# Patient Record
Sex: Female | Born: 1950 | Race: Black or African American | Hispanic: No | Smoking: Never smoker
Health system: Southern US, Community
[De-identification: ages and names within clinical notes are randomized; demographics above are authoritative.]

## PROBLEM LIST (undated history)

## (undated) DIAGNOSIS — I10 Essential (primary) hypertension: Secondary | ICD-10-CM

## (undated) DIAGNOSIS — M1990 Unspecified osteoarthritis, unspecified site: Secondary | ICD-10-CM

## (undated) DIAGNOSIS — I2510 Atherosclerotic heart disease of native coronary artery without angina pectoris: Secondary | ICD-10-CM

## (undated) DIAGNOSIS — I509 Heart failure, unspecified: Secondary | ICD-10-CM

## (undated) DIAGNOSIS — F0390 Unspecified dementia without behavioral disturbance: Secondary | ICD-10-CM

## (undated) DIAGNOSIS — F32A Depression, unspecified: Secondary | ICD-10-CM

## (undated) DIAGNOSIS — E079 Disorder of thyroid, unspecified: Secondary | ICD-10-CM

## (undated) DIAGNOSIS — I639 Cerebral infarction, unspecified: Secondary | ICD-10-CM

## (undated) DIAGNOSIS — F329 Major depressive disorder, single episode, unspecified: Secondary | ICD-10-CM

## (undated) DIAGNOSIS — H539 Unspecified visual disturbance: Secondary | ICD-10-CM

## (undated) DIAGNOSIS — N186 End stage renal disease: Secondary | ICD-10-CM

## (undated) DIAGNOSIS — Z992 Dependence on renal dialysis: Secondary | ICD-10-CM

## (undated) DIAGNOSIS — E785 Hyperlipidemia, unspecified: Secondary | ICD-10-CM

## (undated) DIAGNOSIS — R0602 Shortness of breath: Secondary | ICD-10-CM

## (undated) DIAGNOSIS — K5900 Constipation, unspecified: Secondary | ICD-10-CM

## (undated) HISTORY — PX: TUBAL LIGATION: SHX77

## (undated) HISTORY — DX: Hyperlipidemia, unspecified: E78.5

## (undated) HISTORY — DX: Disorder of thyroid, unspecified: E07.9

## (undated) HISTORY — PX: ARTERIOVENOUS GRAFT PLACEMENT: SUR1029

## (undated) HISTORY — DX: Heart failure, unspecified: I50.9

## (undated) HISTORY — DX: Essential (primary) hypertension: I10

---

## 2009-04-21 ENCOUNTER — Ambulatory Visit: Payer: Self-pay | Admitting: Vascular Surgery

## 2009-07-06 ENCOUNTER — Ambulatory Visit: Payer: Self-pay | Admitting: Surgery

## 2009-07-28 ENCOUNTER — Ambulatory Visit (HOSPITAL_COMMUNITY): Admission: RE | Admit: 2009-07-28 | Discharge: 2009-07-28 | Payer: Self-pay | Admitting: Surgery

## 2009-07-28 ENCOUNTER — Ambulatory Visit: Payer: Self-pay | Admitting: Surgery

## 2009-08-14 ENCOUNTER — Ambulatory Visit: Payer: Self-pay | Admitting: Vascular Surgery

## 2009-08-31 ENCOUNTER — Ambulatory Visit: Payer: Self-pay | Admitting: Surgery

## 2010-06-15 LAB — POCT I-STAT 4, (NA,K, GLUC, HGB,HCT)
HCT: 37 % (ref 36.0–46.0)
Hemoglobin: 12.6 g/dL (ref 12.0–15.0)
Potassium: 4.1 mEq/L (ref 3.5–5.1)
Sodium: 142 mEq/L (ref 135–145)

## 2010-06-15 LAB — GLUCOSE, CAPILLARY: Glucose-Capillary: 91 mg/dL (ref 70–99)

## 2010-07-22 ENCOUNTER — Ambulatory Visit (INDEPENDENT_AMBULATORY_CARE_PROVIDER_SITE_OTHER): Payer: Medicaid Other

## 2010-07-22 DIAGNOSIS — T82898A Other specified complication of vascular prosthetic devices, implants and grafts, initial encounter: Secondary | ICD-10-CM

## 2010-07-22 DIAGNOSIS — N186 End stage renal disease: Secondary | ICD-10-CM

## 2010-07-23 NOTE — Assessment & Plan Note (Signed)
OFFICE VISIT  FINI, Javana L DOB:  05/18/1950                                       07/22/2010 CHART#:[id number]  CHIEF COMPLAINT:  Followup left basilic vein transposition.  HISTORY OF PRESENT ILLNESS:  The patient is a 60 year old woman with chronic kidney disease who is not yet on hemodialysis.  She had a left basilic vein transposition placed on 07/29/2009 by Dr. Myra Gianotti. Recently she was referred by Dr. Darrick Penna because there was no thrill or bruit in the transposition and she was sent for further evaluation. The patient denies any signs of steal.  She has no numbness, tingling or pain in the hand.  She does complain of left shoulder stiffness.  She cannot raise her arm above her head or put it behind her back and this has been going on for several months.  There are no changes in the patient's medical history.  She has a history of diabetes, high blood pressure and hypercholesterolemia as well as some depression.  MEDICATIONS:  Were reviewed today: 1. She takes Paxil 20 mg daily. 2. Quinapril 40 mg twice daily. 3. Vytorin she is no longer taking. 4. Neurontin 600 mg t.i.d. 5. Lantus 20 units at bedtime. 6. Lasix is increased to 80 mg daily. 7. Coreg 25 mg twice daily. 8. Calcitriol at 0.25 mcg once daily alternating with 2 daily. 9. Zocor 40 mg at bedtime.  REVIEW OF SYSTEMS:  Is unchanged.  She has chronic kidney disease and is still urinating.  She has diabetes, high blood pressure.  PHYSICAL EXAM:  This is a well-developed, well-nourished woman in no acute distress.  There is no thrill or bruit in the left upper extremity basilic vein transposition.  All her wounds are healing well.  There is questionable Doppler flow.  However, this is probably the brachial artery.  Her left upper extremity is warm and pink.  Bilateral upper extremities had 2+ radial pulses and 5/5 grip.  Bilaterally her hands were warm and pink.  Her heart rate was  78, her saturations were 98%, respiratory rate was 12.  ASSESSMENT AND PLAN:  Nonfunctioning basilic vein transposition in the left upper extremity with vein mapping showing a small basilic vein on the right and inadequate cephalic veins bilaterally.  In discussion with Dr. Darrick Penna we will hold off putting in a new graft until the patient is closer to needing hemodialysis.  It was felt that the patient will need a Gore-Tex graft as her next hemodialysis access as her right basilic vein and bilateral cephalic veins were insufficient for AV fistulas per vein mapping.  Della Goo, PA-C  Charles E. Fields, MD Electronically Signed  RR/MEDQ  D:  07/22/2010  T:  07/23/2010  Job:  [id number]

## 2010-08-10 NOTE — Consult Note (Signed)
NEW PATIENT CONSULTATION   [name], [name]  DOB:  [birth year]                                       04/21/2009  WGNFA#:[id number]   The patient is referred by Dr. Darrick Penna  for vascular access.  She is a  60 year old female with end-stage renal disease not yet on hemodialysis.  She is reluctant to proceed with access at this time but is willing to  be evaluated.  She is right-handed.   CHRONIC MEDICAL PROBLEMS:  1. Diabetes mellitus type 1.  2. Hypertension.  3. Hyperlipidemia.  4. Hyperparathyroidism.  5. Chronic renal insufficiency.  6. Negative for coronary artery disease, COPD or stroke.   PAST SURGICAL HISTORY:  Tubal surgery for tubal pregnancy x2.   FAMILY HISTORY:  Positive for diabetes and stroke in her father.  Negative for coronary artery disease.   SOCIAL HISTORY:  She is single, has one child and not employed.  She has  not used tobacco or alcohol.   REVIEW OF SYSTEMS:  Negative for chest pain, dyspnea on exertion, PND,  orthopnea.  No pulmonary, GI or claudication-type symptoms.  Denies any  other neurologic or musculoskeletal systems.  All other systems are  negative on review of systems.   PHYSICAL EXAMINATION:  Blood pressure 160/78, heart rate 91, respiration  14, temperature 98.4.  General:  She is a well-developed, well-nourished  female in no apparent distress, alert and oriented x3.  Neck: Supple, 3+  carotid pulses.  No bruits are audible.  Neurologic:  Normal.  Chest:  Clear to auscultation.  Cardiovascular:  Regular rhythm.  No murmurs.  Abdomen:  Soft, nontender with no masses.  Musculoskeletal:  Exam  reveals no major deformities.  Neurologic:  Normal.  Skin:  Free of  rashes.  Left upper extremity reveals 3+ brachial, 2 to 3+ radial pulse.   Today I have ordered and interpreted vein mapping of her left upper  extremity which revealed a patent basilic system on the left side,  slightly better than the right.  Cephalic systems  on both sides are  inadequate for fistula in the forearm or upper arm.  I also visualized  this myself with the SonoSite.  I think her basilic vein is marginal but  we could attempt a basilic vein transposition.  If it is found to be too  small at the time of surgery, we will proceed with left arm AV graft.  She is reluctant to schedule it at that this time but we will aim for  Thursday, February 10.  She will call us to confirm that she will plan  for surgery on that date.     Quita Skye Hart [place], M.D.  Electronically Signed   JDL/MEDQ  D:  04/21/2009  T:  04/22/2009  Job:  [id number]   cc:   Fayrene Fearing L. Deterding, M.D.

## 2010-08-10 NOTE — Assessment & Plan Note (Signed)
OFFICE VISIT   Kelly, [name]  DOB:  19-Aug-1950                                       08/14/2009  CHART#:[id number]   Date of surgery is 07/28/2009.  She is status post a left basilic vein  transposition.   The patient is a 60 year old woman with history of hypertension and  diabetes who is not yet on hemodialysis but has end-stage renal disease  and had a left upper arm basilic vein transposition by Dr. Myra [name] on  07/28/2009.  She has been doing well.  She has had no signs of steal.  She complains of some achiness at night but during the  Day the hand is nice and warm and pink and the achiness is around the  incisional areas.  She has no numbness or tingling in the fingers or  hand and she states she has good motion and is able to use her hand  normally.   PHYSICAL EXAM:  General:  This is a well-developed, well-nourished woman  in no acute distress.  Vital signs:  Her heart rate is 89, blood  pressure is 152/66, her temperature is 97.9.  Left upper extremity:  There is minimal swelling in the left upper extremity.  She has a good  thrill and bruit and the vein is very easily palpable.  She had a  positive radial pulse.  She has good motion and sensation in the hand.   ASSESSMENT:  Two weeks status post left basilic vein transposition with  wounds healing well and no signs of steal.   PLAN:  The patient is to follow up with Dr. Myra [name] in June.  If she has  any issues before that we will see her earlier.   Della Goo, PA-C   Larina Earthly, M.D.  Electronically Signed   RR/MEDQ  D:  08/14/2009  T:  08/14/2009  Job:  [id number]

## 2010-08-10 NOTE — Assessment & Plan Note (Signed)
OFFICE VISIT   [name], [name]  DOB:  12-08-1950                                       07/06/2009  CHART#:[id number]   REASON FOR VISIT:  Dialysis access.   HISTORY:  This is a 60 year old female with stage IV chronic kidney  disease.  She is not yet on dialysis.  She is right-handed.  Her renal  disease is due to diabetes and hypertension.  The patient was initially  scheduled to have an operation for dialysis on February 10.  However,  she cancelled.  She is reluctant about proceeding with the operation.  She comes back in today for further discussion.   The patient suffers from hyperparathyroidism.  It is a complication of  her renal insufficiency.  She also has type 1 diabetes, hypertension,  hyperlipidemia, which are medically managed.   REVIEW OF SYSTEMS:  GENERAL:  Negative fevers, chills, weight gain,  weight loss.  CARDIAC:  Negative.  PULMONARY:  Negative.  GI:  Negative.  GU:  Negative except for kidney disease, as above.  NEURO:  Negative.  MUSCULOSKELETAL:  Positive for joint pain.  PSYCH:  Negative.  EENT:  Negative.  HEME:  Negative.  SKIN:  Negative.   PAST MEDICAL HISTORY:  Diabetes, type 1, hypertension, hyperlipidemia,  hyperparathyroidism, chronic renal insufficiency.   PAST SURGICAL HISTORY:  Tubal ligation for tubal pregnancy x2.   FAMILY HISTORY:  Positive for diabetes and stroke in her father.  Negative for coronary disease.   SOCIAL HISTORY:  She is single.  One child.  Negative for tobacco or  alcohol.   ALLERGIES:  None.   MEDICATIONS:  Please see medical record.   PHYSICAL EXAMINATION:  Heart rate 73, blood pressure 134/67, temperature  is 98.2.  General:  She is well-appearing in no distress.  HEENT:  Within normal limits.  Lungs:  Respirations are nonlabored.  Cardiovascular:  Regular rate and rhythm.  Abdomen is soft and  nontender.  She has a well-healed infraumbilical midline incision.  Musculoskeletal is  without major deformity.  Neuro:  She has no focal  deficits.  Skin is without rash.   Vein mapping:  The patient has a 0.27 cm left basilic vein.   ASSESSMENT:  Chronic kidney disease needing permanent access.   PLAN:  I had a lengthy conversation describing the need to proceed with  access management at this time.  We talked about the procedure details  for basilic vein transposition on the left.  All of her questions were  answered.  Her surgery has been scheduled for Tuesday, May 3.     Jorge Ny, MD  Electronically Signed   VWB/MEDQ  D:  07/06/2009  T:  07/07/2009  Job:  [id number]   cc:   Fayrene Fearing [name]. Deterding, M.D.

## 2010-08-10 NOTE — Assessment & Plan Note (Signed)
OFFICE VISIT   [name], Kelly L  DOB:  [birth year]                                       08/31/2009  CHART#:[id number]   REASON FOR VISIT:  Followup.   The patient returns today for followup of her left basilic vein  transposition which was performed on 07/28/2009.  She has no complaints  except for some numbness in her forearm.  She denies any complaints of  steal syndrome.  She is not yet on dialysis and is due to see the renal  service in the next month.   On examination, there is a good thrill within her fistula.  Her hand is  warm.   At this point, I am satisfied with her results.  I would stress not to  have this accessed until August if she were to require dialysis.  I will  see her back on a p.r.n. basis.     Jorge Ny, MD  Electronically Signed   VWB/MEDQ  D:  08/31/2009  T:  09/01/2009  Job:  [id number]

## 2010-08-10 NOTE — Procedures (Signed)
CEPHALIC VEIN MAPPING   INDICATION:  Placement of AV fistula.   HISTORY:  Diabetes.   EXAM:   The right cephalic vein is compressible.   Diameter measurements range from 0.15 cm to 0.31 cm.   The right basilic vein is compressible.   Diameter measurements range from 0.21 cm to 0.32 cm.   The left cephalic vein is compressible.   Diameter measurements range from 0.09 cm to 0.18 cm.   The left basilic vein is compressible.   Diameter measurements range from 0.27 cm to 0.28 cm.   See attached worksheet for all measurements.   IMPRESSION:  1. Patient's bilateral cephalic veins are not of acceptable diameter      for use as a dialysis access site.  2. Patient's bilateral basilic veins are of marginal acceptable      diameter for use as a dialysis access site.        ___________________________________________  Quita Skye. Hart [place], M.D.   CB/MEDQ  D:  04/21/2009  T:  04/22/2009  Job:  [id number]

## 2010-10-25 ENCOUNTER — Encounter: Payer: Medicaid Other | Admitting: Surgery

## 2010-11-09 ENCOUNTER — Encounter: Payer: Self-pay | Admitting: Vascular Surgery

## 2010-11-25 ENCOUNTER — Ambulatory Visit: Payer: Medicaid Other | Admitting: Vascular Surgery

## 2010-11-26 ENCOUNTER — Encounter: Payer: Self-pay | Admitting: Vascular Surgery

## 2010-11-30 ENCOUNTER — Encounter: Payer: Medicaid Other | Admitting: Vascular Surgery

## 2010-12-28 ENCOUNTER — Other Ambulatory Visit: Payer: Medicaid Other

## 2011-01-17 ENCOUNTER — Encounter: Payer: Self-pay | Admitting: Vascular Surgery

## 2011-01-18 ENCOUNTER — Ambulatory Visit (INDEPENDENT_AMBULATORY_CARE_PROVIDER_SITE_OTHER): Payer: Medicaid Other | Admitting: Vascular Surgery

## 2011-01-18 ENCOUNTER — Encounter: Payer: Self-pay | Admitting: Vascular Surgery

## 2011-01-18 VITALS — BP 128/68 | HR 67 | Resp 16 | Ht 62.0 in | Wt 146.3 lb

## 2011-01-18 DIAGNOSIS — I70219 Atherosclerosis of native arteries of extremities with intermittent claudication, unspecified extremity: Secondary | ICD-10-CM

## 2011-01-18 NOTE — Progress Notes (Signed)
The patient presents today for evaluation of left leg discomfort. She reports difficulty with her left leg. She reports that this causes her to "hop" when she walks. He denies any pain area she specifically denies any calf cramping or discomfort with walking. She has no history of tissue loss. He does have known renal insufficiency. He reports left arm bursitis has had injection with some mild improvement. She has been worked up for possible right brain stroke with CT showing no change and a carotid duplex showing no significant stenosis. I had these for review and discussed these as well. There  Past Medical History  Diagnosis Date  . Diabetes mellitus   . Hypertension   . CHF (congestive heart failure)   . Hyperlipidemia   . Chronic kidney disease   . Thyroid disease     History  Substance Use Topics  . Smoking status: Never Smoker   . Smokeless tobacco: Not on file  . Alcohol Use: No    Family History  Problem Relation Age of Onset  . Diabetes Father   . Stroke Father     No Known Allergies  Current outpatient prescriptions:amLODipine (NORVASC) 5 MG tablet, Take 5 mg by mouth daily.  , Disp: , Rfl: ;  aspirin 81 MG tablet, Take 81 mg by mouth daily.  , Disp: , Rfl: ;  calcitRIOL (ROCALTROL) 0.25 MCG capsule, Take 0.25 mcg by mouth daily.  , Disp: , Rfl: ;  carvedilol (COREG) 25 MG tablet, Take 25 mg by mouth 2 (two) times daily with a meal.  , Disp: , Rfl: ;  furosemide (LASIX) 80 MG tablet, Take 80 mg by mouth daily.  , Disp: , Rfl:  gabapentin (NEURONTIN) 600 MG tablet, Take 600 mg by mouth 3 (three) times daily.  , Disp: , Rfl: ;  Insulin Glargine (LANTUS [place]), Inject 20 Units into the skin at bedtime.  , Disp: , Rfl: ;  quinapril (ACCUPRIL) 40 MG tablet, Take 40 mg by mouth 2 (two) times daily.  , Disp: , Rfl: ;  cilostazol (PLETAL) 100 MG tablet, Take 100 mg by mouth 2 (two) times daily.  , Disp: , Rfl:  ergocalciferol (VITAMIN D2) 50000 UNITS capsule, Take 50,000 Units by mouth  once a week.  , Disp: , Rfl: ;  ezetimibe-simvastatin (VYTORIN) 10-40 MG per tablet, Take 1 tablet by mouth at bedtime.  , Disp: , Rfl: ;  PARoxetine (PAXIL) 20 MG tablet, Take 20 mg by mouth every morning.  , Disp: , Rfl: ;  simvastatin (ZOCOR) 40 MG tablet, Take 40 mg by mouth at bedtime.  , Disp: , Rfl:   BP 128/68  Pulse 67  Resp 16  Ht 5\' 2"  (1.575 m)  Wt 146 lb 4.8 oz (66.361 kg)  BMI 26.76 kg/m2  Body mass index is 26.76 kg/(m^2).       Visit exam well-developed black female in no acute distress area HEENT normal. Carotid arteries without bruits bilaterally. She has 2+ radial and 2+ femoral pulses bilaterally. I do not feel pedal pulses. She has no tissue loss on her feet and no ulcers or rashes. On walking she does have an unusual gait was some stiffness in her hip and also has stiffness in her left arm. Heart regular rate and rhythm. Chest clear bilaterally.  Impression and plan: Patient does have moderate lower extremity arterial insufficiency by physical exam. Her symptoms did not go along with this. Her walking difficulties related to this this specifically in her left  hip. I would not recommend any further evaluation she develops true claudication symptoms or rest pain. She will see [place] on an as-needed basis.

## 2011-02-03 ENCOUNTER — Encounter: Payer: Self-pay | Admitting: Vascular Surgery

## 2011-02-03 DIAGNOSIS — I70219 Atherosclerosis of native arteries of extremities with intermittent claudication, unspecified extremity: Secondary | ICD-10-CM | POA: Insufficient documentation

## 2011-03-09 ENCOUNTER — Ambulatory Visit: Payer: Medicaid Other | Admitting: Vascular Surgery

## 2011-03-28 ENCOUNTER — Encounter: Payer: Self-pay | Admitting: Vascular Surgery

## 2011-03-30 ENCOUNTER — Encounter: Payer: Self-pay | Admitting: Vascular Surgery

## 2011-03-30 ENCOUNTER — Ambulatory Visit (INDEPENDENT_AMBULATORY_CARE_PROVIDER_SITE_OTHER): Payer: Medicaid Other | Admitting: Vascular Surgery

## 2011-03-30 VITALS — BP 116/64 | HR 75 | Temp 98.3°F | Ht 62.0 in | Wt 146.0 lb

## 2011-03-30 DIAGNOSIS — T82898A Other specified complication of vascular prosthetic devices, implants and grafts, initial encounter: Secondary | ICD-10-CM | POA: Insufficient documentation

## 2011-03-30 DIAGNOSIS — N186 End stage renal disease: Principal | ICD-10-CM

## 2011-03-30 DIAGNOSIS — Z992 Dependence on renal dialysis: Secondary | ICD-10-CM | POA: Insufficient documentation

## 2011-03-30 HISTORY — DX: End stage renal disease: N18.6

## 2011-03-30 NOTE — Progress Notes (Signed)
[facility]  Patient name: [name] MRN: [id number] DOB: 1950-04-14 Sex: female  CC: evaluate for new hemodialysis access.   HPI: [name] is a 61 y.o. female with chronic kidney disease who is not yet on dialysis. She previously had a left basilic vein transposition which has been chronically occluded. She states that ever since that surgery she's had problems with bursitis in her left shoulder and she's not fully able to rotate her forearm. He had been evaluated in our office before and was felt to be a candidate for an AV graft.  However, this had never been scheduled. She comes in today to discuss scheduling surgery. She's had no recent uremic symptoms. Specifically she denies nausea, vomiting, fatigue, anorexia, or palpitations.  Past Medical History  Diagnosis Date  . Diabetes mellitus   . Hypertension   . CHF (congestive heart failure)   . Hyperlipidemia   . Chronic kidney disease   . Thyroid disease     Family History  Problem Relation Age of Onset  . Diabetes Father   . Stroke Father     SOCIAL HISTORY: History  Substance Use Topics  . Smoking status: Never Smoker   . Smokeless tobacco: Not on file  . Alcohol Use: No    No Known Allergies  Current Outpatient Prescriptions  Medication Sig Dispense Refill  . amLODipine (NORVASC) 5 MG tablet Take 5 mg by mouth daily.        Marland Kitchen aspirin 81 MG tablet Take 81 mg by mouth daily.        . calcitRIOL (ROCALTROL) 0.25 MCG capsule Take 0.25 mcg by mouth daily.        . carvedilol (COREG) 25 MG tablet Take 25 mg by mouth 2 (two) times daily with a meal.        . cilostazol (PLETAL) 100 MG tablet Take 100 mg by mouth 2 (two) times daily.        . ergocalciferol (VITAMIN D2) 50000 UNITS capsule Take 50,000 Units by mouth once a week.        . ezetimibe-simvastatin (VYTORIN) 10-40 MG per tablet Take 1 tablet by mouth at bedtime.        . furosemide (LASIX) 80 MG tablet Take 80 mg by mouth  daily.        Marland Kitchen gabapentin (NEURONTIN) 600 MG tablet Take 600 mg by mouth 3 (three) times daily.        . Insulin Glargine (LANTUS [place]) Inject 20 Units into the skin at bedtime.        Marland Kitchen PARoxetine (PAXIL) 20 MG tablet Take 20 mg by mouth every morning.        . quinapril (ACCUPRIL) 40 MG tablet Take 40 mg by mouth 2 (two) times daily.        . simvastatin (ZOCOR) 40 MG tablet Take 40 mg by mouth at bedtime.          REVIEW OF SYSTEMS: Arly.Keller ] denotes positive finding; [  ] denotes negative finding CARDIOVASCULAR:  [ ]  chest pain   [ ]  chest pressure   [ ]  palpitations   [ ]  orthopnea   [ ]  dyspnea on exertion   [ ]  claudication   [ ]  rest pain   [ ]  DVT   [ ]  phlebitis PULMONARY:   [ ]  productive cough   [ ]  asthma   [ ]  wheezing CONSTITUTIONAL:  [ ]  fever   [ ]  chills  PHYSICAL EXAM: Filed Vitals:   03/30/11 1057  BP: 116/64  Pulse: 75  Temp: 98.3 F (36.8 C)  TempSrc: Oral  Height: 5\' 2"  (1.575 m)  Weight: 146 lb (66.225 kg)  SpO2: 98%   Body mass index is 26.70 kg/(m^2). GENERAL: The patient is a well-nourished female, in no acute distress. The vital signs are documented above. CARDIOVASCULAR: There is a regular rate and rhythm without significant murmur appreciated. She has a palpable brachial and radial pulse bilaterally. PULMONARY: There is good air exchange bilaterally without wheezing or rales. ABDOMEN: Soft and non-tender with normal pitched bowel sounds.  MUSCULOSKELETAL: she is unable to fully externally rotate her left forearm which she attributes to bursitis. NEUROLOGIC: No focal weakness or paresthesias are detected. SKIN: There are no ulcers or rashes noted. PSYCHIATRIC: The patient has a normal affect.  DATA:  I have reviewed her previous vein mapping which shows that her forearm and upper arm cephalic veins in both upper to terminate were not adequate for a fistula. Likewise her basilic vein on the right is not adequate.  MEDICAL ISSUES: I've recommended that we  place a left upper arm AV graft. She appears to have fairly small blood vessels and in addition I don't think she can adequately externally rotate her forearm on the left for using a forearm graft. Her surgeries been scheduled for 04/07/2011. I have explained the indications for placement of an AV graft. I have reviewed the potential complications of placement of an AV graft. These risks include, but are not limited to, graft thrombosis, graft infection, wound healing problems, bleeding, arm swelling, and steal syndrome. All the patient's questions were answered and they are agreeable to proceed with surgery.   Aaliyah Gavel S [facility] [place] Office: [phone]

## 2011-03-31 ENCOUNTER — Other Ambulatory Visit: Payer: Self-pay

## 2011-04-06 ENCOUNTER — Inpatient Hospital Stay (HOSPITAL_COMMUNITY): Admission: RE | Admit: 2011-04-06 | Payer: Medicaid Other | Source: Ambulatory Visit

## 2011-04-12 ENCOUNTER — Encounter (HOSPITAL_COMMUNITY): Payer: Self-pay | Admitting: Pharmacy Technician

## 2011-04-19 NOTE — Progress Notes (Signed)
Contacted Dr. Adele Dan, spoke with Drinda Butts, notified patient states will be postponing surgery due to having the "flu"

## 2011-04-22 ENCOUNTER — Encounter (HOSPITAL_COMMUNITY): Admission: RE | Payer: Self-pay | Source: Ambulatory Visit

## 2011-04-22 ENCOUNTER — Ambulatory Visit (HOSPITAL_COMMUNITY): Admission: RE | Admit: 2011-04-22 | Payer: Medicaid Other | Source: Ambulatory Visit | Admitting: Vascular Surgery

## 2011-04-22 SURGERY — INSERTION OF ARTERIOVENOUS (AV) GORE-TEX GRAFT ARM
Anesthesia: Monitor Anesthesia Care | Site: Arm Upper | Laterality: Left

## 2012-06-05 ENCOUNTER — Other Ambulatory Visit: Payer: Self-pay | Admitting: *Deleted

## 2012-06-05 DIAGNOSIS — Z01810 Encounter for preprocedural cardiovascular examination: Secondary | ICD-10-CM

## 2012-06-19 ENCOUNTER — Encounter: Payer: Self-pay | Admitting: Vascular Surgery

## 2012-06-20 ENCOUNTER — Ambulatory Visit: Payer: Medicaid Other | Admitting: Vascular Surgery

## 2012-06-25 ENCOUNTER — Encounter: Payer: Self-pay | Admitting: Vascular Surgery

## 2012-06-26 ENCOUNTER — Encounter: Payer: Self-pay | Admitting: Vascular Surgery

## 2012-06-26 ENCOUNTER — Ambulatory Visit (INDEPENDENT_AMBULATORY_CARE_PROVIDER_SITE_OTHER): Payer: Medicaid Other | Admitting: Vascular Surgery

## 2012-06-26 VITALS — BP 182/78 | HR 93 | Resp 18 | Ht 62.0 in | Wt 150.0 lb

## 2012-06-26 DIAGNOSIS — N184 Chronic kidney disease, stage 4 (severe): Secondary | ICD-10-CM

## 2012-06-26 NOTE — Progress Notes (Signed)
[facility] [place] HISTORY AND PHYSICAL   CC: ESRD Referring Physician: Dr Deterding  History of Present Illness: [name] is a 61 y.o. female with Hx HTN, DM and CKD Who is not yet on HD. She had a left BVT in 07/2009 which failed. She has small veins per VM done 07/2010. She was to have a Left upper arm AVGG in Jan. 2013 but this was cancelled. She is still making Urine. She is here for evaluation for new graft  No results found.  Past Medical History  Diagnosis Date  . Diabetes mellitus   . Hypertension   . CHF (congestive heart failure)   . Hyperlipidemia   . Chronic kidney disease   . Thyroid disease     ROS: [x] Positive   [ ] Denies    General: [ ] Weight loss, [ ] Fever, [ ] chills Neurologic: [ ] Dizziness, [ ] Blackouts, [ ] Seizure [ ] Stroke, [ ] "Mini stroke", [ ] Slurred speech, [ ] Temporary blindness; [ ] weakness in arms or legs, [ ] Hoarseness Cardiac: [ ] Chest pain/pressure, [ ] Shortness of breath at rest [ ] Shortness of breath with exertion, [ ] Atrial fibrillation or irregular heartbeat Vascular: [ ] Pain in legs with walking, [ ] Pain in legs at rest, [ ] Pain in legs at night,  [ ] Non-healing ulcer, [ ] Blood clot in vein/DVT,   Pulmonary: [ ] Home oxygen, [ ] Productive cough, [ ] Coughing up blood, [ ] Asthma,  [ ] Wheezing Musculoskeletal:  [x ] Arthritis, [ ] Low back pain, [ ] Joint pain Hematologic: [ ] Easy Bruising, [x ] Anemia; [ ] Hepatitis Gastrointestinal: [ ] Blood in stool, [ ] Gastroesophageal Reflux/heartburn, [ ] Trouble swallowing Urinary: [x ] chronic Kidney disease, [ ] on HD - [ ] MWF or [ ] TTHS, [ ] Burning with urination, [ ] Difficulty urinating Skin: [ ] Rashes, [ ] Wounds Psychological: [ ] Anxiety, [ ] Depression   Social History History  Substance Use Topics  . Smoking status: Never Smoker   . Smokeless tobacco: Never Used  . Alcohol Use: No    Family History Family History  Problem  Relation Age of Onset  . Diabetes Father   . Stroke Father   . Heart disease Mother     No Known Allergies  Current Outpatient Prescriptions  Medication Sig Dispense Refill  . amLODipine (NORVASC) 5 MG tablet Take 5 mg by mouth daily.        . aspirin 81 MG tablet Take 81 mg by mouth daily.        . calcitRIOL (ROCALTROL) 0.25 MCG capsule Take 0.25 mcg by mouth daily.        . carvedilol (COREG) 25 MG tablet Take 25 mg by mouth 2 (two) times daily with a meal.        . cilostazol (PLETAL) 100 MG tablet Take 100 mg by mouth 2 (two) times daily.        . ergocalciferol (VITAMIN D2) 50000 UNITS capsule Take 50,000 Units by mouth once a week.        . ezetimibe-simvastatin (VYTORIN) 10-40 MG per tablet Take 1 tablet by mouth at bedtime.        . furosemide (LASIX) 80 MG tablet Take 80 mg by mouth daily.        . gabapentin (NEURONTIN) 600 MG tablet Take 600 mg by   mouth 3 (three) times daily.        . Insulin Glargine (LANTUS [place]) Inject 20 Units into the skin at bedtime.        . PARoxetine (PAXIL) 20 MG tablet Take 20 mg by mouth every morning.        . quinapril (ACCUPRIL) 40 MG tablet Take 40 mg by mouth 2 (two) times daily.        . simvastatin (ZOCOR) 40 MG tablet Take 40 mg by mouth at bedtime.         No current facility-administered medications for this visit.    Physical Examination  Filed Vitals:   06/26/12 1546  BP: 182/78  Pulse: 93  Resp: 18    Body mass index is 27.43 kg/(m^2).  General:  WDWN in NAD Gait:slow and unsteady as pt cannot see well HENT: WNL  Eyes: Pupils equal Pulmonary: normal non-labored breathing , with bibasilar  Rales, but no rhonchi or wheezing Cardiac: RRR, without  Murmurs, rubs or gallops; No carotid bruits Skin: no rashes, ulcers noted Vascular Exam/Pulses: 2+ radial pulses bilat Left BVT -no thrill or bruit  Extremities without ischemic changes, no Gangrene , no cellulitis; no open wounds;  Musculoskeletal: no muscle wasting or  atrophy  Neurologic: A&O X 3; Appropriate Affect ; SENSATION: normal; MOTOR FUNCTION:  moving all extremities equally. Speech is fluent/normal  Non-Invasive Vascular Imaging: VM done in 5/ 2012 shows veins in both arms to be 2 mm or less   ASSESSMENT: [name] is a 61 y.o. 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 Deterding for timing of graft placement and call granddaughter- Shanika Alston [phone] with information regarding surgical date  Clinic MD: TFE   I have examined the patient, reviewed and agree with above. The next option for hemodialysis is a left upper arm AV graft. Discuss this and potential complications of this with the patient who understands. We will defer and checked with the nephrology group as to timing of her access.  EARLY, TODD, MD 06/26/2012 4:49 PM  

## 2012-06-28 ENCOUNTER — Encounter: Payer: Self-pay | Admitting: Nephrology

## 2012-06-28 ENCOUNTER — Other Ambulatory Visit: Payer: Self-pay

## 2012-06-29 ENCOUNTER — Encounter (HOSPITAL_COMMUNITY): Payer: Self-pay | Admitting: *Deleted

## 2012-06-29 ENCOUNTER — Other Ambulatory Visit: Payer: Self-pay

## 2012-06-29 NOTE — Progress Notes (Signed)
No cardiologist. Pt. Didn't know primary MD.stated she went to her 3 weeks ago (she is a new doctor) and the MD sent her to [facility] to be checked out. She stated the MD though she was having a heart attack. Stated the hospital ran a test, ekg? And said every thing was fine. Pt. Stated she had no symptoms. Requested ekg/studies/discharge summary form [facility].

## 2012-07-01 MED ORDER — DEXTROSE 5 % IV SOLN
1.5000 g | INTRAVENOUS | Status: AC
  Administered 2012-07-02: 1.5 g via INTRAVENOUS
  Filled 2012-07-01: qty 1.5

## 2012-07-02 ENCOUNTER — Encounter (HOSPITAL_COMMUNITY): Payer: Self-pay | Admitting: Anesthesiology

## 2012-07-02 ENCOUNTER — Ambulatory Visit (HOSPITAL_COMMUNITY): Payer: Medicaid Other

## 2012-07-02 ENCOUNTER — Encounter (HOSPITAL_COMMUNITY)
Admission: RE | Disposition: A | Discharge status: Home / Self Care | Payer: Self-pay | Source: Ambulatory Visit | Attending: Vascular Surgery

## 2012-07-02 ENCOUNTER — Ambulatory Visit (HOSPITAL_COMMUNITY): Payer: Medicaid Other | Admitting: Anesthesiology

## 2012-07-02 ENCOUNTER — Encounter (HOSPITAL_COMMUNITY): Payer: Self-pay | Admitting: *Deleted

## 2012-07-02 ENCOUNTER — Ambulatory Visit (HOSPITAL_COMMUNITY)
Admission: RE | Admit: 2012-07-02 | Discharge: 2012-07-02 | Disposition: A | Discharge status: Home / Self Care | Payer: Medicaid Other | Source: Ambulatory Visit | Attending: Vascular Surgery | Admitting: Vascular Surgery

## 2012-07-02 DIAGNOSIS — N186 End stage renal disease: Secondary | ICD-10-CM

## 2012-07-02 DIAGNOSIS — E785 Hyperlipidemia, unspecified: Secondary | ICD-10-CM | POA: Insufficient documentation

## 2012-07-02 DIAGNOSIS — E119 Type 2 diabetes mellitus without complications: Secondary | ICD-10-CM | POA: Insufficient documentation

## 2012-07-02 DIAGNOSIS — I509 Heart failure, unspecified: Secondary | ICD-10-CM | POA: Insufficient documentation

## 2012-07-02 DIAGNOSIS — E079 Disorder of thyroid, unspecified: Secondary | ICD-10-CM | POA: Insufficient documentation

## 2012-07-02 DIAGNOSIS — Z79899 Other long term (current) drug therapy: Secondary | ICD-10-CM | POA: Insufficient documentation

## 2012-07-02 DIAGNOSIS — I120 Hypertensive chronic kidney disease with stage 5 chronic kidney disease or end stage renal disease: Secondary | ICD-10-CM | POA: Insufficient documentation

## 2012-07-02 DIAGNOSIS — Z794 Long term (current) use of insulin: Secondary | ICD-10-CM | POA: Insufficient documentation

## 2012-07-02 HISTORY — DX: Major depressive disorder, single episode, unspecified: F32.9

## 2012-07-02 HISTORY — DX: Shortness of breath: R06.02

## 2012-07-02 HISTORY — PX: AV FISTULA PLACEMENT: SHX1204

## 2012-07-02 HISTORY — DX: Unspecified osteoarthritis, unspecified site: M19.90

## 2012-07-02 HISTORY — DX: Depression, unspecified: F32.A

## 2012-07-02 LAB — POCT I-STAT 4, (NA,K, GLUC, HGB,HCT): Glucose, Bld: 222 mg/dL — ABNORMAL HIGH (ref 70–99)

## 2012-07-02 LAB — GLUCOSE, CAPILLARY
Glucose-Capillary: 191 mg/dL — ABNORMAL HIGH (ref 70–99)
Glucose-Capillary: 206 mg/dL — ABNORMAL HIGH (ref 70–99)

## 2012-07-02 LAB — SURGICAL PCR SCREEN
MRSA, PCR: NEGATIVE
Staphylococcus aureus: NEGATIVE

## 2012-07-02 SURGERY — ARTERIOVENOUS (AV) FISTULA CREATION
Anesthesia: Monitor Anesthesia Care | Site: Arm Upper | Laterality: Left | Wound class: Clean

## 2012-07-02 MED ORDER — PROPOFOL INFUSION 10 MG/ML OPTIME
INTRAVENOUS | Status: DC | PRN
  Administered 2012-07-02: 75 ug/kg/min via INTRAVENOUS

## 2012-07-02 MED ORDER — MIDAZOLAM HCL 5 MG/5ML IJ SOLN
INTRAMUSCULAR | Status: DC | PRN
  Administered 2012-07-02 (×2): 1 mg via INTRAVENOUS

## 2012-07-02 MED ORDER — ONDANSETRON HCL 4 MG/2ML IJ SOLN: 4.0000 mg | Freq: Once | INTRAMUSCULAR | Status: DC | PRN

## 2012-07-02 MED ORDER — LIDOCAINE-EPINEPHRINE (PF) 1 %-1:200000 IJ SOLN
INTRAMUSCULAR | Status: DC | PRN
  Administered 2012-07-02: 4 mL

## 2012-07-02 MED ORDER — MUPIROCIN 2 % EX OINT: TOPICAL_OINTMENT | Freq: Two times a day (BID) | CUTANEOUS | Status: DC

## 2012-07-02 MED ORDER — MUPIROCIN 2 % EX OINT
TOPICAL_OINTMENT | CUTANEOUS | Status: AC
  Administered 2012-07-02: 10:00:00
  Filled 2012-07-02: qty 22

## 2012-07-02 MED ORDER — SODIUM CHLORIDE 0.9 % IV SOLN
INTRAVENOUS | Status: DC
  Administered 2012-07-02 (×2): via INTRAVENOUS

## 2012-07-02 MED ORDER — FENTANYL CITRATE 0.05 MG/ML IJ SOLN
INTRAMUSCULAR | Status: DC | PRN
  Administered 2012-07-02 (×3): 50 ug via INTRAVENOUS

## 2012-07-02 MED ORDER — BUPIVACAINE HCL (PF) 0.5 % IJ SOLN
INTRAMUSCULAR | Status: AC
  Filled 2012-07-02: qty 30

## 2012-07-02 MED ORDER — OXYCODONE HCL 5 MG PO TABS: 5.0000 mg | ORAL_TABLET | Freq: Four times a day (QID) | ORAL | Status: DC | PRN

## 2012-07-02 MED ORDER — HYDROMORPHONE HCL PF 1 MG/ML IJ SOLN: 0.2500 mg | INTRAMUSCULAR | Status: DC | PRN

## 2012-07-02 MED ORDER — 0.9 % SODIUM CHLORIDE (POUR BTL) OPTIME
TOPICAL | Status: DC | PRN
  Administered 2012-07-02: 1000 mL

## 2012-07-02 MED ORDER — SODIUM CHLORIDE 0.9 % IR SOLN
Status: DC | PRN
  Administered 2012-07-02: 12:00:00

## 2012-07-02 MED ORDER — LIDOCAINE-EPINEPHRINE (PF) 1 %-1:200000 IJ SOLN
INTRAMUSCULAR | Status: AC
  Filled 2012-07-02: qty 10

## 2012-07-02 MED ORDER — BUPIVACAINE HCL 0.5 % IJ SOLN
INTRAMUSCULAR | Status: DC | PRN
  Administered 2012-07-02: 4 mL

## 2012-07-02 MED ORDER — ACETAMINOPHEN 10 MG/ML IV SOLN: 1000.0000 mg | Freq: Once | INTRAVENOUS | Status: DC | PRN

## 2012-07-02 SURGICAL SUPPLY — 38 items
CANISTER SUCTION 2500CC (MISCELLANEOUS) ×2 IMPLANT
CATH EMB 4FR 80CM (CATHETERS) IMPLANT
CLIP TI MEDIUM 6 (CLIP) ×2 IMPLANT
CLIP TI WIDE RED SMALL 6 (CLIP) ×2 IMPLANT
CLOTH BEACON ORANGE TIMEOUT ST (SAFETY) ×2 IMPLANT
COVER PROBE W GEL 5X96 (DRAPES) ×2 IMPLANT
COVER SURGICAL LIGHT HANDLE (MISCELLANEOUS) ×2 IMPLANT
DERMABOND ADVANCED (GAUZE/BANDAGES/DRESSINGS) ×1
DERMABOND ADVANCED .7 DNX12 (GAUZE/BANDAGES/DRESSINGS) ×1 IMPLANT
ELECT REM PT RETURN 9FT ADLT (ELECTROSURGICAL) ×2
ELECTRODE REM PT RTRN 9FT ADLT (ELECTROSURGICAL) ×1 IMPLANT
GEL ULTRASOUND 20GR AQUASONIC (MISCELLANEOUS) ×2 IMPLANT
GLOVE BIO SURGEON STRL SZ7 (GLOVE) ×2 IMPLANT
GLOVE BIOGEL M 6.5 STRL (GLOVE) ×4 IMPLANT
GLOVE BIOGEL PI IND STRL 6.5 (GLOVE) ×4 IMPLANT
GLOVE BIOGEL PI IND STRL 7.0 (GLOVE) ×1 IMPLANT
GLOVE BIOGEL PI IND STRL 7.5 (GLOVE) ×1 IMPLANT
GLOVE BIOGEL PI INDICATOR 6.5 (GLOVE) ×4
GLOVE BIOGEL PI INDICATOR 7.0 (GLOVE) ×1
GLOVE BIOGEL PI INDICATOR 7.5 (GLOVE) ×1
GLOVE ECLIPSE 6.5 STRL STRAW (GLOVE) ×4 IMPLANT
GLOVE ECLIPSE 7.0 STRL STRAW (GLOVE) ×2 IMPLANT
GOWN STRL NON-REIN LRG LVL3 (GOWN DISPOSABLE) ×10 IMPLANT
KIT BASIN OR (CUSTOM PROCEDURE TRAY) ×2 IMPLANT
KIT ROOM TURNOVER OR (KITS) ×2 IMPLANT
NS IRRIG 1000ML POUR BTL (IV SOLUTION) ×2 IMPLANT
PACK CV ACCESS (CUSTOM PROCEDURE TRAY) ×2 IMPLANT
PAD ARMBOARD 7.5X6 YLW CONV (MISCELLANEOUS) ×4 IMPLANT
SPONGE SURGIFOAM ABS GEL 100 (HEMOSTASIS) IMPLANT
SUT MNCRL AB 4-0 PS2 18 (SUTURE) ×2 IMPLANT
SUT PROLENE 6 0 BV (SUTURE) ×4 IMPLANT
SUT PROLENE 7 0 BV 1 (SUTURE) ×4 IMPLANT
SUT VIC AB 3-0 SH 27 (SUTURE) ×1
SUT VIC AB 3-0 SH 27X BRD (SUTURE) ×1 IMPLANT
TOWEL OR 17X24 6PK STRL BLUE (TOWEL DISPOSABLE) ×2 IMPLANT
TOWEL OR 17X26 10 PK STRL BLUE (TOWEL DISPOSABLE) ×2 IMPLANT
UNDERPAD 30X30 INCONTINENT (UNDERPADS AND DIAPERS) ×2 IMPLANT
WATER STERILE IRR 1000ML POUR (IV SOLUTION) ×2 IMPLANT

## 2012-07-02 NOTE — Op Note (Signed)
OPERATIVE NOTE   PROCEDURE: 1. left first stage brachial vein transposition (brachiobrachial arteriovenous fistula) placement  PRE-OPERATIVE DIAGNOSIS: imminent end stage renal disease   POST-OPERATIVE DIAGNOSIS: same as above   SURGEON: Leonides Sake, MD  ASSISTANT(S): Doreatha Massed, PAC   ANESTHESIA: local and MAC  ESTIMATED BLOOD LOSS: 50 cc  FINDING(S): 1. Diseased but patent left brachial artery 2. Proximal left brachial vein only 3-4 mm in diameter 3. Palpable thrill at end of case 4. Dopplerable radial artery  SPECIMEN(S):  none  INDICATIONS:   [name] is a 62 y.o. female who presents with imminent end stage renal disease.  The patient is scheduled for a left upper arm arteriovenous graft.  The patient is aware the risks include but are not limited to: bleeding, infection, steal syndrome, nerve damage, ischemic monomelic neuropathy, failure to mature, and need for additional procedures.  The patient is aware of the risks of the procedure and elects to proceed forward.  DESCRIPTION: After full informed written consent was obtained from the patient, the patient was brought back to the operating room and placed supine upon the operating table.  Prior to induction, the patient received IV antibiotics.   After obtaining adequate anesthesia, the patient was then prepped and draped in the standard fashion for a left arm access procedure.  I turned my attention first to identifying the patient's brachial vein and brachial artery.  Using SonoSite guidance, the location of these vessels were marked out on the skin.   Unfortunately the brachial vein was only 3-4 mm on the Sonosite in the proximal upper arm.  The vein only became >6 mm in the axilla.  Distally the vein was about the same size, so I felt an attempt a brachial vein transposition was acceptable and might make a more distal upper arm arteriovenous graft possible if this fistula failed.  At this point, I injected local  anesthetic to obtain a field block of the antecubitum.  In total, I injected about 10 mL of a 1:1 mixture of 0.5% Marcaine without epinephrine and 1% lidocaine with epinephrine.  I made a longitudinal incision at the level of the antecubitum and dissected through the subcutaneous tissue and fascia to gain exposure of the brachial artery.  This was noted to be 3 mm in diameter externally.  This was dissected out proximally and distally and controlled with vessel loops .  I then dissected out the brachial vein.  This was noted to be 2.5-3 mm in diameter externally.  The distal segment of the vein was ligated with a  2-0 silk, and the vein was transected.  The proximal segment was iinterrogated with serial dilators.  The vein accepted up to a 3.5 mm dilator without any difficulty.  I then instilled the heparinized saline into the vein and clamped it.  At this point, I reset my exposure of the brachial artery and placed the artery under tension proximally and distally.  I made an arteriotomy with a #11 blade, and then I extended the arteriotomy with a Potts scissor.  I injected heparinized saline proximal and distal to this arteriotomy.  The vein was then sewn to the artery in an end-to-side configuration with a running stitch of 7-0 Prolene.  Prior to completing this anastomosis, I allowed the vein and artery to backbleed.  There was no evidence of clot from any vessels.  I completed the anastomosis in the usual fashion and then released all vessel loops and clamps.  There was a palpable  thrill in the venous outflow, and there was a dopplerable radial pulse.  At this point, I irrigated out the surgical wound.  There was no further active bleeding.  The subcutaneous tissue was reapproximated with a running stitch of 3-0 Vicryl.  The skin was then reapproximated with a running subcuticular stitch of 4-0 Vicryl.  The skin was then cleaned, dried, and reinforced with Dermabond.  The patient tolerated this procedure well.    COMPLICATIONS: none  CONDITION: stable  Leonides Sake, MD [facility] [place] Office: [phone] Pager: [phone]  07/02/2012, 12:53 PM

## 2012-07-02 NOTE — Preoperative (Signed)
Beta Blockers   Reason not to administer Beta Blockers:Not Applicable 

## 2012-07-02 NOTE — Transfer of Care (Signed)
Immediate Anesthesia Transfer of Care Note  Patient: [name]  Procedure(s) Performed: Procedure(s): ARTERIOVENOUS (AV) FISTULA CREATION (Left)  Patient Location: PACU  Anesthesia Type:MAC  Level of Consciousness: awake, alert , oriented and sedated  Airway & Oxygen Therapy: Patient Spontanous Breathing and Patient connected to nasal cannula oxygen  Post-op Assessment: Report given to PACU RN, Post -op Vital signs reviewed and stable and Patient moving all extremities  Post vital signs: Reviewed and stable  Complications: No apparent anesthesia complications

## 2012-07-02 NOTE — H&P (View-Only) (Signed)
[facility] [place] HISTORY AND PHYSICAL   CC: ESRD Referring Physician: Dr Deterding  History of Present Illness: [name] is a 62 y.o. female with Hx HTN, DM and CKD Who is not yet on HD. She had a left BVT in 07/2009 which failed. She has small veins per VM done 07/2010. She was to have a Left upper arm AVGG in Jan. 2013 but this was cancelled. She is still making Urine. She is here for evaluation for new graft  No results found.  Past Medical History  Diagnosis Date  . Diabetes mellitus   . Hypertension   . CHF (congestive heart failure)   . Hyperlipidemia   . Chronic kidney disease   . Thyroid disease     ROS: [x]  Positive   [ ]  Denies    General: [ ]  Weight loss, [ ]  Fever, [ ]  chills Neurologic: [ ]  Dizziness, [ ]  Blackouts, [ ]  Seizure [ ]  Stroke, [ ]  "Mini stroke", [ ]  Slurred speech, [ ]  Temporary blindness; [ ]  weakness in arms or legs, [ ]  Hoarseness Cardiac: [ ]  Chest pain/pressure, [ ]  Shortness of breath at rest [ ]  Shortness of breath with exertion, [ ]  Atrial fibrillation or irregular heartbeat Vascular: [ ]  Pain in legs with walking, [ ]  Pain in legs at rest, [ ]  Pain in legs at night,  [ ]  Non-healing ulcer, [ ]  Blood clot in vein/DVT,   Pulmonary: [ ]  Home oxygen, [ ]  Productive cough, [ ]  Coughing up blood, [ ]  Asthma,  [ ]  Wheezing Musculoskeletal:  [x ] Arthritis, [ ]  Low back pain, [ ]  Joint pain Hematologic: [ ]  Easy Bruising, [x ] Anemia; [ ]  Hepatitis Gastrointestinal: [ ]  Blood in stool, [ ]  Gastroesophageal Reflux/heartburn, [ ]  Trouble swallowing Urinary: [x ] chronic Kidney disease, [ ]  on HD - [ ]  MWF or [ ]  TTHS, [ ]  Burning with urination, [ ]  Difficulty urinating Skin: [ ]  Rashes, [ ]  Wounds Psychological: [ ]  Anxiety, [ ]  Depression   Social History History  Substance Use Topics  . Smoking status: Never Smoker   . Smokeless tobacco: Never Used  . Alcohol Use: No    Family History Family History  Problem  Relation Age of Onset  . Diabetes Father   . Stroke Father   . Heart disease Mother     No Known Allergies  Current Outpatient Prescriptions  Medication Sig Dispense Refill  . amLODipine (NORVASC) 5 MG tablet Take 5 mg by mouth daily.        Marland Kitchen aspirin 81 MG tablet Take 81 mg by mouth daily.        . calcitRIOL (ROCALTROL) 0.25 MCG capsule Take 0.25 mcg by mouth daily.        . carvedilol (COREG) 25 MG tablet Take 25 mg by mouth 2 (two) times daily with a meal.        . cilostazol (PLETAL) 100 MG tablet Take 100 mg by mouth 2 (two) times daily.        . ergocalciferol (VITAMIN D2) 50000 UNITS capsule Take 50,000 Units by mouth once a week.        . ezetimibe-simvastatin (VYTORIN) 10-40 MG per tablet Take 1 tablet by mouth at bedtime.        . furosemide (LASIX) 80 MG tablet Take 80 mg by mouth daily.        Marland Kitchen gabapentin (NEURONTIN) 600 MG tablet Take 600 mg by  mouth 3 (three) times daily.        . Insulin Glargine (LANTUS [place]) Inject 20 Units into the skin at bedtime.        Marland Kitchen PARoxetine (PAXIL) 20 MG tablet Take 20 mg by mouth every morning.        . quinapril (ACCUPRIL) 40 MG tablet Take 40 mg by mouth 2 (two) times daily.        . simvastatin (ZOCOR) 40 MG tablet Take 40 mg by mouth at bedtime.         No current facility-administered medications for this visit.    Physical Examination  Filed Vitals:   06/26/12 1546  BP: 182/78  Pulse: 93  Resp: 18    Body mass index is 27.43 kg/(m^2).  General:  WDWN in NAD Gait:slow and unsteady as pt cannot see well HENT: WNL  Eyes: Pupils equal Pulmonary: normal non-labored breathing , with bibasilar  Rales, but no rhonchi or wheezing Cardiac: RRR, without  Murmurs, rubs or gallops; No carotid bruits Skin: no rashes, ulcers noted Vascular Exam/Pulses: 2+ radial pulses bilat Left BVT -no thrill or bruit  Extremities without ischemic changes, no Gangrene , no cellulitis; no open wounds;  Musculoskeletal: no muscle wasting or  atrophy  Neurologic: A&O X 3; Appropriate Affect ; SENSATION: normal; MOTOR FUNCTION:  moving all extremities equally. Speech is fluent/normal  Non-Invasive Vascular Imaging: VM done in 5/ 2012 shows veins in both arms to be 2 mm or less   ASSESSMENT: [name] is a 62 y.o. 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 Darrick Penna for timing of graft placement and call granddaughter- Karrie Doffing [phone] with information regarding surgical date  Clinic MD: TFE   I have examined the patient, reviewed and agree with above. The next option for hemodialysis is a left upper arm AV graft. Discuss this and potential complications of this with the patient who understands. We will defer and checked with the nephrology group as to timing of her access.  Amyah Clawson, MD 06/26/2012 4:49 PM

## 2012-07-02 NOTE — Anesthesia Preprocedure Evaluation (Signed)
Anesthesia Evaluation  Patient identified by MRN, date of birth, ID band Patient awake    Reviewed: Allergy & Precautions, H&P , NPO status , Patient's Chart, lab work & pertinent test results, reviewed documented beta blocker date and time   Airway Mallampati: II      Dental  (+) Teeth Intact and Dental Advisory Given   Pulmonary  breath sounds clear to auscultation        Cardiovascular Rhythm:Regular Rate:Normal     Neuro/Psych    GI/Hepatic   Endo/Other    Renal/GU      Musculoskeletal   Abdominal   Peds  Hematology   Anesthesia Other Findings   Reproductive/Obstetrics                           Anesthesia Physical Anesthesia Plan  ASA: III  Anesthesia Plan: General   Post-op Pain Management:    Induction: Intravenous  Airway Management Planned: LMA  Additional Equipment:   Intra-op Plan:   Post-operative Plan: Extubation in OR  Informed Consent: I have reviewed the patients History and Physical, chart, labs and discussed the procedure including the risks, benefits and alternatives for the proposed anesthesia with the patient or authorized representative who has indicated his/her understanding and acceptance.   Dental advisory given  Plan Discussed with: CRNA, Anesthesiologist and Surgeon  Anesthesia Plan Comments: (ESRD has not started HD K -3.9 Type 2 DM glucose 222 Htn H/O CHF per records lungs clear, CXR OK  Plan GA with oral ETT  Kipp Brood, MD)        Anesthesia Quick Evaluation

## 2012-07-02 NOTE — Anesthesia Postprocedure Evaluation (Signed)
  Anesthesia Post-op Note  Patient: [name]  Procedure(s) Performed: Procedure(s): ARTERIOVENOUS (AV) FISTULA CREATION (Left)  Patient Location: PACU  Anesthesia Type:General  Level of Consciousness: awake, alert  and oriented  Airway and Oxygen Therapy: Patient Spontanous Breathing and Patient connected to nasal cannula oxygen  Post-op Pain: mild  Post-op Assessment: Post-op Vital signs reviewed, Patient's Cardiovascular Status Stable, Respiratory Function Stable, Patent Airway and Pain level controlled  Post-op Vital Signs: stable  Complications: No apparent anesthesia complications

## 2012-07-02 NOTE — Interval H&P Note (Signed)
[facility] of   History and Physical Update  The patient was interviewed and re-examined.  The patient's previous History and Physical has been reviewed and is unchanged from Dr. Bosie Helper consult.  There is no change in the plan of care: LUA AVG placement.  Leonides Sake, MD [facility] [place] Office: [phone] Pager: [phone]  07/02/2012, 10:53 AM

## 2012-07-03 ENCOUNTER — Telehealth: Payer: Self-pay | Admitting: Vascular Surgery

## 2012-07-03 ENCOUNTER — Other Ambulatory Visit: Payer: Self-pay | Admitting: *Deleted

## 2012-07-03 DIAGNOSIS — Z4931 Encounter for adequacy testing for hemodialysis: Secondary | ICD-10-CM

## 2012-07-03 DIAGNOSIS — N186 End stage renal disease: Secondary | ICD-10-CM

## 2012-07-03 NOTE — Telephone Encounter (Signed)
Message copied by Margaretmary Eddy on Tue Jul 03, 2012  4:23 PM ------      Message from: Sharee Pimple      Created: Tue Jul 03, 2012  1:53 PM      Regarding: schedule                   ----- Message -----         From: Dara Lords, PA-C         Sent: 07/02/2012   1:05 PM           To: Sharee Pimple, CMA            S/p 1st stage left brachial vein transposition by Dr. Imogene Burn.  F/u in 4-6 weeks with him.            Thanks,       Lelon Mast ------

## 2012-07-03 NOTE — Telephone Encounter (Signed)
Called pt, phone dc, dtrs phone mailbox full, unable to reach, sent letter - kf

## 2012-07-04 ENCOUNTER — Encounter (HOSPITAL_COMMUNITY): Payer: Self-pay | Admitting: Vascular Surgery

## 2012-08-03 ENCOUNTER — Ambulatory Visit: Payer: Medicaid Other | Admitting: Vascular Surgery

## 2012-08-23 ENCOUNTER — Encounter: Payer: Self-pay | Admitting: Vascular Surgery

## 2012-08-24 ENCOUNTER — Ambulatory Visit: Payer: Medicaid Other | Admitting: Vascular Surgery

## 2012-09-13 ENCOUNTER — Encounter: Payer: Self-pay | Admitting: Vascular Surgery

## 2012-09-14 ENCOUNTER — Ambulatory Visit (INDEPENDENT_AMBULATORY_CARE_PROVIDER_SITE_OTHER): Payer: Medicaid Other | Admitting: Vascular Surgery

## 2012-09-14 ENCOUNTER — Encounter: Payer: Self-pay | Admitting: Vascular Surgery

## 2012-09-14 ENCOUNTER — Ambulatory Visit: Payer: Medicaid Other | Admitting: Vascular Surgery

## 2012-09-14 VITALS — BP 204/71 | HR 77 | Ht 62.0 in | Wt 145.0 lb

## 2012-09-14 DIAGNOSIS — N186 End stage renal disease: Secondary | ICD-10-CM

## 2012-09-14 NOTE — Progress Notes (Signed)
[facility] [place]  Postoperative Access Visit  History of Present Illness  [name] is a 62 y.o. 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  The patient's wounds are healed.  The patient notes no steal symptoms.  The patient is able to complete their activities of daily living.  The patient's current symptoms are: none.  Past Medical History  Diagnosis Date  . Diabetes mellitus   . Hypertension   . CHF (congestive heart failure)   . Hyperlipidemia   . Chronic kidney disease   . Thyroid disease   . Depression   . Shortness of breath     at night  . Arthritis     Past Surgical History  Procedure Laterality Date  . Arteriovenous graft placement      left upper extremity  . Tubal ligation      x2  . Av fistula placement Left 07/02/2012    Procedure: ARTERIOVENOUS (AV) FISTULA CREATION;  Surgeon: Fransisco Hertz, MD;  Location: [facility] OR;  Service: Vascular;  Laterality: Left;    History   Social History  . Marital Status: Divorced    Spouse Name: N/A    Number of Children: N/A  . Years of Education: N/A   Occupational History  . Not on file.   Social History Main Topics  . Smoking status: Never Smoker   . Smokeless tobacco: Never Used  . Alcohol Use: No  . Drug Use: No  . Sexually Active: Not on file   Other Topics Concern  . Not on file   Social History Narrative  . No narrative on file    Family History  Problem Relation Age of Onset  . Diabetes Father   . Stroke Father   . Heart disease Mother      Current Outpatient Prescriptions on File Prior to Visit  Medication Sig Dispense Refill  . amitriptyline (ELAVIL) 25 MG tablet Take 25 mg by mouth at bedtime.      Marland Kitchen amLODipine (NORVASC) 5 MG tablet Take 5 mg by mouth daily.        Marland Kitchen aspirin 81 MG tablet Take 81 mg by mouth daily.        . calcitRIOL (ROCALTROL) 0.25 MCG capsule Take 0.25 mcg by mouth daily.        . carvedilol  (COREG) 25 MG tablet Take 25 mg by mouth 2 (two) times daily with a meal.        . furosemide (LASIX) 80 MG tablet Take 80 mg by mouth daily.        Marland Kitchen gabapentin (NEURONTIN) 600 MG tablet Take 600 mg by mouth 3 (three) times daily.        . hydrALAZINE (APRESOLINE) 50 MG tablet Take 50 mg by mouth 3 (three) times daily.      . Insulin Glargine (LANTUS [place]) Inject 20 Units into the skin at bedtime.        Marland Kitchen oxyCODONE (ROXICODONE) 5 MG immediate release tablet Take 1 tablet (5 mg total) by mouth every 6 (six) hours as needed for pain.  30 tablet  0  . quinapril (ACCUPRIL) 20 MG tablet Take 20 mg by mouth daily.       No current facility-administered medications on file prior to visit.    No Known Allergies  REVIEW OF SYSTEMS:  (Positives checked otherwise negative)  CARDIOVASCULAR:  []  chest pain, []  chest pressure, []  palpitations, []  shortness  of breath when laying flat, []  shortness of breath with exertion,  []  pain in feet when walking, []  pain in feet when laying flat, []  history of blood clot in veins (DVT), []  history of phlebitis, []  swelling in legs, []  varicose veins  PULMONARY:  []  productive cough, []  asthma, []  wheezing  NEUROLOGIC:  []  weakness in arms or legs, []  numbness in arms or legs, []  difficulty speaking or slurred speech, []  temporary loss of vision in one eye, []  dizziness  HEMATOLOGIC:  []  bleeding problems, []  problems with blood clotting too easily  MUSCULOSKEL:  []  joint pain, []  joint swelling  GASTROINTEST:  []  vomiting blood, []  blood in stool     GENITOURINARY:  []  burning with urination, []  blood in urine, [x]  urine production, [x]  CKD stage V  PSYCHIATRIC:  []  history of major depression  INTEGUMENTARY:  []  rashes, []  ulcers  For VQI Use Only  PRE-ADM LIVING: Home  AMB STATUS: Ambulatory  Physical Examination Filed Vitals:   09/14/12 1133  BP: 204/71  Pulse: 77   PULM CTAB, sym exp, good air movement CV: Rrr, nl s1,s2, -m/r/t/g LUE:  Incision is healed, skin feels warm, hand grip is 5/5, sensation in digits is intact, no palpable thrill, bruit can not be auscultated   Medical Decision Making  [name] is a 62 y.o. 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  As she is nearing ESRD per pt, I would go ahead and proceed with the LUA AVG.  A looped LUA AVG may be needed as the brachial artery was found to be diseased distally.    Risk, benefits, and alternatives to access surgery were discussed.  The patient is aware the risks include but are not limited to: bleeding, infection, steal syndrome, nerve damage, ischemic monomelic neuropathy, failure to mature, need for additional procedures, death and stroke.  The patient agrees to proceed forward with the procedure.  She is scheduled for 7 JUL 14.  Thank you for allowing [place] to participate in this patient's care.  Leonides Sake, MD [facility] [place] Office: [phone] Pager: [phone]  09/14/2012, 11:59 AM

## 2012-09-20 ENCOUNTER — Other Ambulatory Visit: Payer: Self-pay

## 2012-09-21 ENCOUNTER — Encounter (HOSPITAL_COMMUNITY): Payer: Self-pay | Admitting: Pharmacy Technician

## 2012-09-27 ENCOUNTER — Encounter (HOSPITAL_COMMUNITY): Payer: Self-pay | Admitting: *Deleted

## 2012-09-27 NOTE — Progress Notes (Signed)
I requested EKG from [facility].

## 2012-09-30 MED ORDER — DEXTROSE 5 % IV SOLN
1.5000 g | INTRAVENOUS | Status: AC
  Administered 2012-10-01: 1.5 g via INTRAVENOUS
  Filled 2012-09-30: qty 1.5

## 2012-10-01 ENCOUNTER — Ambulatory Visit (HOSPITAL_COMMUNITY)
Admission: RE | Admit: 2012-10-01 | Discharge: 2012-10-01 | Disposition: A | Discharge status: Home / Self Care | Payer: Medicaid Other | Source: Ambulatory Visit | Attending: Vascular Surgery | Admitting: Vascular Surgery

## 2012-10-01 ENCOUNTER — Ambulatory Visit (HOSPITAL_COMMUNITY): Payer: Medicaid Other | Admitting: Anesthesiology

## 2012-10-01 ENCOUNTER — Encounter (HOSPITAL_COMMUNITY): Payer: Self-pay | Admitting: Anesthesiology

## 2012-10-01 ENCOUNTER — Encounter (HOSPITAL_COMMUNITY): Payer: Self-pay | Admitting: *Deleted

## 2012-10-01 ENCOUNTER — Encounter (HOSPITAL_COMMUNITY)
Admission: RE | Disposition: A | Discharge status: Home / Self Care | Payer: Self-pay | Source: Ambulatory Visit | Attending: Vascular Surgery

## 2012-10-01 DIAGNOSIS — I509 Heart failure, unspecified: Secondary | ICD-10-CM | POA: Insufficient documentation

## 2012-10-01 DIAGNOSIS — F3289 Other specified depressive episodes: Secondary | ICD-10-CM | POA: Insufficient documentation

## 2012-10-01 DIAGNOSIS — I120 Hypertensive chronic kidney disease with stage 5 chronic kidney disease or end stage renal disease: Secondary | ICD-10-CM | POA: Insufficient documentation

## 2012-10-01 DIAGNOSIS — E079 Disorder of thyroid, unspecified: Secondary | ICD-10-CM | POA: Insufficient documentation

## 2012-10-01 DIAGNOSIS — M129 Arthropathy, unspecified: Secondary | ICD-10-CM | POA: Insufficient documentation

## 2012-10-01 DIAGNOSIS — N186 End stage renal disease: Secondary | ICD-10-CM

## 2012-10-01 DIAGNOSIS — Z7982 Long term (current) use of aspirin: Secondary | ICD-10-CM | POA: Insufficient documentation

## 2012-10-01 DIAGNOSIS — E785 Hyperlipidemia, unspecified: Secondary | ICD-10-CM | POA: Insufficient documentation

## 2012-10-01 DIAGNOSIS — F329 Major depressive disorder, single episode, unspecified: Secondary | ICD-10-CM | POA: Insufficient documentation

## 2012-10-01 DIAGNOSIS — Z79899 Other long term (current) drug therapy: Secondary | ICD-10-CM | POA: Insufficient documentation

## 2012-10-01 DIAGNOSIS — E119 Type 2 diabetes mellitus without complications: Secondary | ICD-10-CM | POA: Insufficient documentation

## 2012-10-01 HISTORY — DX: Unspecified visual disturbance: H53.9

## 2012-10-01 HISTORY — PX: AV FISTULA PLACEMENT: SHX1204

## 2012-10-01 HISTORY — DX: Cerebral infarction, unspecified: I63.9

## 2012-10-01 HISTORY — DX: Constipation, unspecified: K59.00

## 2012-10-01 HISTORY — DX: Unspecified dementia, unspecified severity, without behavioral disturbance, psychotic disturbance, mood disturbance, and anxiety: F03.90

## 2012-10-01 LAB — GLUCOSE, CAPILLARY: Glucose-Capillary: 138 mg/dL — ABNORMAL HIGH (ref 70–99)

## 2012-10-01 LAB — POCT I-STAT 4, (NA,K, GLUC, HGB,HCT)
Glucose, Bld: 158 mg/dL — ABNORMAL HIGH (ref 70–99)
HCT: 31 % — ABNORMAL LOW (ref 36.0–46.0)
Hemoglobin: 10.5 g/dL — ABNORMAL LOW (ref 12.0–15.0)

## 2012-10-01 SURGERY — INSERTION OF ARTERIOVENOUS (AV) GORE-TEX GRAFT ARM
Anesthesia: General | Site: Arm Upper | Laterality: Left | Wound class: Clean

## 2012-10-01 MED ORDER — LIDOCAINE HCL (CARDIAC) 20 MG/ML IV SOLN
INTRAVENOUS | Status: DC | PRN
  Administered 2012-10-01: 80 mg via INTRAVENOUS

## 2012-10-01 MED ORDER — ONDANSETRON HCL 4 MG/2ML IJ SOLN: 4.0000 mg | Freq: Once | INTRAMUSCULAR | Status: DC | PRN

## 2012-10-01 MED ORDER — THROMBIN 20000 UNITS EX SOLR
CUTANEOUS | Status: AC
  Filled 2012-10-01: qty 20000

## 2012-10-01 MED ORDER — SODIUM CHLORIDE 0.9 % IR SOLN
Status: DC | PRN
  Administered 2012-10-01: 11:00:00

## 2012-10-01 MED ORDER — PHENYLEPHRINE HCL 10 MG/ML IJ SOLN
10.0000 mg | INTRAVENOUS | Status: DC | PRN
  Administered 2012-10-01: 20 ug/min via INTRAVENOUS

## 2012-10-01 MED ORDER — OXYCODONE HCL 5 MG PO TABS: 5.0000 mg | ORAL_TABLET | ORAL | Status: DC | PRN

## 2012-10-01 MED ORDER — BUPIVACAINE HCL (PF) 0.5 % IJ SOLN
INTRAMUSCULAR | Status: AC
  Filled 2012-10-01: qty 30

## 2012-10-01 MED ORDER — FENTANYL CITRATE 0.05 MG/ML IJ SOLN
INTRAMUSCULAR | Status: DC | PRN
  Administered 2012-10-01 (×2): 50 ug via INTRAVENOUS

## 2012-10-01 MED ORDER — LIDOCAINE-EPINEPHRINE (PF) 1 %-1:200000 IJ SOLN
INTRAMUSCULAR | Status: AC
  Filled 2012-10-01: qty 10

## 2012-10-01 MED ORDER — PROPOFOL 10 MG/ML IV BOLUS
INTRAVENOUS | Status: DC | PRN
  Administered 2012-10-01: 30 mg via INTRAVENOUS
  Administered 2012-10-01: 110 mg via INTRAVENOUS

## 2012-10-01 MED ORDER — PROTAMINE SULFATE 10 MG/ML IV SOLN
INTRAVENOUS | Status: DC | PRN
  Administered 2012-10-01: 5 mg via INTRAVENOUS
  Administered 2012-10-01: 10 mg via INTRAVENOUS

## 2012-10-01 MED ORDER — 0.9 % SODIUM CHLORIDE (POUR BTL) OPTIME
TOPICAL | Status: DC | PRN
  Administered 2012-10-01: 1000 mL

## 2012-10-01 MED ORDER — PHENYLEPHRINE HCL 10 MG/ML IJ SOLN
INTRAMUSCULAR | Status: DC | PRN
  Administered 2012-10-01 (×2): 40 ug via INTRAVENOUS
  Administered 2012-10-01: 80 ug via INTRAVENOUS
  Administered 2012-10-01 (×4): 40 ug via INTRAVENOUS

## 2012-10-01 MED ORDER — HEPARIN SODIUM (PORCINE) 1000 UNIT/ML IJ SOLN
INTRAMUSCULAR | Status: DC | PRN
  Administered 2012-10-01: 5000 [IU] via INTRAVENOUS

## 2012-10-01 MED ORDER — ONDANSETRON HCL 4 MG/2ML IJ SOLN
INTRAMUSCULAR | Status: DC | PRN
  Administered 2012-10-01: 4 mg via INTRAVENOUS

## 2012-10-01 MED ORDER — SODIUM CHLORIDE 0.9 % IV SOLN
INTRAVENOUS | Status: DC
  Administered 2012-10-01: 09:00:00 via INTRAVENOUS

## 2012-10-01 MED ORDER — EPHEDRINE SULFATE 50 MG/ML IJ SOLN
INTRAMUSCULAR | Status: DC | PRN
  Administered 2012-10-01 (×9): 5 mg via INTRAVENOUS

## 2012-10-01 MED ORDER — HYDROMORPHONE HCL PF 1 MG/ML IJ SOLN
INTRAMUSCULAR | Status: AC
  Filled 2012-10-01: qty 1

## 2012-10-01 MED ORDER — THROMBIN 20000 UNITS EX SOLR
CUTANEOUS | Status: DC | PRN
  Administered 2012-10-01: 12:00:00 via TOPICAL

## 2012-10-01 MED ORDER — HYDROMORPHONE HCL PF 1 MG/ML IJ SOLN
0.2500 mg | INTRAMUSCULAR | Status: DC | PRN
  Administered 2012-10-01 (×2): 0.5 mg via INTRAVENOUS

## 2012-10-01 SURGICAL SUPPLY — 40 items
ARMBAND PINK RESTRICT EXTREMIT (MISCELLANEOUS) ×2 IMPLANT
BLADE SURG ROTATE 9660 (MISCELLANEOUS) ×2 IMPLANT
CANISTER SUCTION 2500CC (MISCELLANEOUS) ×2 IMPLANT
CLIP TI MEDIUM 6 (CLIP) ×2 IMPLANT
CLIP TI WIDE RED SMALL 6 (CLIP) ×2 IMPLANT
CLOTH BEACON ORANGE TIMEOUT ST (SAFETY) ×2 IMPLANT
COVER SURGICAL LIGHT HANDLE (MISCELLANEOUS) ×2 IMPLANT
DECANTER SPIKE VIAL GLASS SM (MISCELLANEOUS) IMPLANT
DERMABOND ADVANCED (GAUZE/BANDAGES/DRESSINGS) ×1
DERMABOND ADVANCED .7 DNX12 (GAUZE/BANDAGES/DRESSINGS) ×1 IMPLANT
ELECT REM PT RETURN 9FT ADLT (ELECTROSURGICAL) ×2
ELECTRODE REM PT RTRN 9FT ADLT (ELECTROSURGICAL) ×1 IMPLANT
GLOVE BIO SURGEON STRL SZ7 (GLOVE) ×2 IMPLANT
GLOVE BIOGEL PI IND STRL 6.5 (GLOVE) ×3 IMPLANT
GLOVE BIOGEL PI IND STRL 7.5 (GLOVE) ×1 IMPLANT
GLOVE BIOGEL PI INDICATOR 6.5 (GLOVE) ×3
GLOVE BIOGEL PI INDICATOR 7.5 (GLOVE) ×1
GLOVE ECLIPSE 6.5 STRL STRAW (GLOVE) ×2 IMPLANT
GLOVE SURG SS PI 7.0 STRL IVOR (GLOVE) ×2 IMPLANT
GOWN STRL NON-REIN LRG LVL3 (GOWN DISPOSABLE) ×4 IMPLANT
GOWN STRL REIN XL XLG (GOWN DISPOSABLE) ×2 IMPLANT
GRAFT GORETEX STRT 4-7X45 (Vascular Products) ×2 IMPLANT
KIT BASIN OR (CUSTOM PROCEDURE TRAY) ×2 IMPLANT
KIT ROOM TURNOVER OR (KITS) ×2 IMPLANT
LOOP VESSEL MINI RED (MISCELLANEOUS) ×2 IMPLANT
NS IRRIG 1000ML POUR BTL (IV SOLUTION) ×2 IMPLANT
PACK CV ACCESS (CUSTOM PROCEDURE TRAY) ×2 IMPLANT
PAD ARMBOARD 7.5X6 YLW CONV (MISCELLANEOUS) ×4 IMPLANT
SPONGE SURGIFOAM ABS GEL 100 (HEMOSTASIS) ×2 IMPLANT
SUT MNCRL AB 4-0 PS2 18 (SUTURE) ×4 IMPLANT
SUT PROLENE 5 0 C 1 24 (SUTURE) IMPLANT
SUT PROLENE 6 0 BV (SUTURE) ×8 IMPLANT
SUT PROLENE 7 0 BV 1 (SUTURE) IMPLANT
SUT SILK 2 0 FS (SUTURE) IMPLANT
SUT VIC AB 3-0 SH 27 (SUTURE) ×2
SUT VIC AB 3-0 SH 27X BRD (SUTURE) ×2 IMPLANT
TOWEL OR 17X24 6PK STRL BLUE (TOWEL DISPOSABLE) ×2 IMPLANT
TOWEL OR 17X26 10 PK STRL BLUE (TOWEL DISPOSABLE) ×2 IMPLANT
UNDERPAD 30X30 INCONTINENT (UNDERPADS AND DIAPERS) ×2 IMPLANT
WATER STERILE IRR 1000ML POUR (IV SOLUTION) IMPLANT

## 2012-10-01 NOTE — Interval H&P Note (Signed)
[facility] [place]  History and Physical Update  Correction to procedure:  LUA looped AVG placement.  Fistula has already thrombosed, so ligation is not needed.  Leonides Sake, MD [facility] [place] Office: [phone] Pager: [phone]  10/01/2012, 9:36 AM

## 2012-10-01 NOTE — Preoperative (Signed)
Beta Blockers   Reason not to administer Beta Blockers:Not Applicable 

## 2012-10-01 NOTE — Op Note (Signed)
OPERATIVE NOTE   PROCEDURE:  left upper arm looped (brachioaxillary) arteriovenous graft   PRE-OPERATIVE DIAGNOSIS: end stage renal disease   POST-OPERATIVE DIAGNOSIS: same as above   SURGEON: Leonides Sake, MD  ANESTHESIA: general  ESTIMATED BLOOD LOSS: 50 cc  FINDING(S): 1. Palpable thrill at end of case 2. Dopplerable radial signal at end of case.  SPECIMEN(S):  none  INDICATIONS:   [name] is a 62 y.o. female who presents with end stage renal disease.  The patient's previous basilic vein transposition and brachial vein transposition thrombosed in this patient.  I suspect this was due to significant upper extremity atherosclerosis.  I recommended we attempt a looped upper arm arteriovenous graft to try to improve the inflow to the graft.  Risk, benefits, and alternatives to access surgery were discussed.  The patient is aware the risks include but are not limited to: bleeding, infection, steal syndrome, nerve damage, ischemic monomelic neuropathy, failure to mature, and need for additional procedures.  The patient is aware of the risks and elects to proceed forward.  DESCRIPTION: After full informed written consent was obtained from the patient, the patient was brought back to the operating room and placed supine upon the operating table.  The patient was given IV antibiotics prior to proceeding.  After obtaining adequate sedation, the patient was prepped and draped in standard fashion for a left arm access procedure.  I turned my attention first to the antecubitum.  Under ultrasound guidance, I identified the location of the brachial artery and axillary vein at the level of axilla and marked it on the skin.  I made an longitudinal incision over the brachial artery, and dissected down through the subcutaneous tissue and  fascia carefully and was able to dissect out the brachial artery.  The artery was about 2.5-3.0 mm externally.  It was controlled proximally and distally with vessel  loops.  I then dissected out the axillary vein.  Externally, it appeared to be 6 mm in diameter.  I then dissected this vein proximally and distal.  I then marked a loop out on the upper arm for the graft.  I made an incision at the apex of the route and dissected a shallow pocket.  I took a Company secretary and dissected from the axillary incision to the apical incision.  Then I delivered the 4 x 7-mm stretch Gore-Tex graft, through this metal tunneler and then pulled out the metal tunneler leaving the graft in place.  I then dissected from the axillary to the apical incision with the metal Gore tunneler and deliver the graft through the metal tunnel.  I removed the tunnel, leaving the graft in place with the 4-mm end was left on the superior side and the 7-mm end toward the inferior.  I then gave the patient 5000 units of heparin to gain anticoagulation.  After waiting 3 minutes, I placed the brachial artery under tension proximally and distally with vessel loops, made an arteriotomy and extended it with a Potts scissor.  I sewed the 4-mm end of the graft to this arteriotomy with a running stitch of 6-0 Prolene.  At this point, then I completed the anastomosis in the usual fashion.  I released the vessel loops on the inflow and allowed the artery to decompress through the graft. There was acceptable bleeding through this graft.  I felt the bleeding was acceptable as the patient's blood pressure was low intraoperative.  I clamped the graft near its arterial anastomosis and sucked  out all the blood in the graft and loaded the graft with heparinized saline.  At this point, I pulled the graft to appropriate length and reset my exposure of the high brachial vein.  I tied off the axillary vein distally with a 2-0 silk and then transected it.  There was good venous backbleeding from the vein.  Then, I injected some heparinized saline into this vein and then clamped it.  I then spatulated the vein to facilitate an  end-to- end anastomosis.  I also spatulated the graft to facilitate an end-to-end anastomosis.  In the process of spatulating, I cut the graft to appropriate length for this anastomosis.  This graft was sewn to the vein in an end-to-end configuration with a 6-0 Prolene.  Prior to completing this anastomosis, I allowed the vein to back bleed and then I also allowed the artery to bleed in an antegrade fashion.  I completed this anastomosis in the usual fashion, and then irrigated out the high bicipital exposure and then placed thrombin and Gelfoam.  I then turned my attention back to the antecubitum.  The distal radial artery was dopplerable.  Using a continuous Doppler, the brachial artery proximally and distally had biphasic waveforms.  The venous outflow had a flow signature consistent with widely patent arteriovenous graft.  At this point, I washed out both incisions and placed thrombin and gelfoam.  After waiting a few minutes, there was no more active bleeding.  I washed out both incision and removed the gelfoam.  The subcutaneous tissue in the apical incision was reapproximated with a running stitch of 3-0 Vicryl.  The skin was then reapproximated with a running subcuticular 4-0 Monocryl.  The skin was then cleaned, dried, and Dermabond used to reinforce the skin closure.  I then turned my attention to the axillary incision.  There was no more active bleeding.  The subcutaneous tissue was repaired with double layer running stitch of 3-0 Vicryl.  The skin was then reapproximated with running subcuticular 4-0 Monocryl.  The skin was then cleaned, dried, and then the skin closure was reinforced with Dermabond.    COMPLICATIONS: none  CONDITION: stable   Leonides Sake, MD [facility] [place] Office: [phone] Pager: [phone]  10/01/2012, 12:27 PM

## 2012-10-01 NOTE — Anesthesia Procedure Notes (Signed)
Procedure Name: LMA Insertion Date/Time: 10/01/2012 10:13 AM Performed by: Romie Minus K Pre-anesthesia Checklist: Patient identified, Emergency Drugs available, Suction available, Patient being monitored and Timeout performed Patient Re-evaluated:Patient Re-evaluated prior to inductionOxygen Delivery Method: Circle system utilized Preoxygenation: Pre-oxygenation with 100% oxygen Intubation Type: IV induction Ventilation: Mask ventilation without difficulty LMA: LMA inserted LMA Size: 4.0 Number of attempts: 1 Placement Confirmation: positive ETCO2,  CO2 detector and breath sounds checked- equal and bilateral Tube secured with: Tape Dental Injury: Teeth and Oropharynx as per pre-operative assessment

## 2012-10-01 NOTE — Interval H&P Note (Signed)
[facility] [place]  History and Physical Update  The patient was interviewed and re-examined.  The patient's previous History and Physical has been reviewed and is unchanged.  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  Leonides Sake, MD [facility] [place] Office: [phone] Pager: [phone]  10/01/2012, 7:37 AM

## 2012-10-01 NOTE — Anesthesia Preprocedure Evaluation (Addendum)
Anesthesia Evaluation  Patient identified by MRN, date of birth, ID band Patient awake    Reviewed: Allergy & Precautions, H&P , NPO status , Patient's Chart, lab work & pertinent test results, reviewed documented beta blocker date and time   History of Anesthesia Complications Negative for: history of anesthetic complications  Airway Mallampati: II TM Distance: >3 FB Neck ROM: Full    Dental  (+) Teeth Intact and Dental Advisory Given   Pulmonary former smoker,          Cardiovascular hypertension, Pt. on medications and Pt. on home beta blockers + Peripheral Vascular Disease and +CHF     Neuro/Psych Depression Left leg weakness. CVA, Residual Symptoms    GI/Hepatic negative GI ROS, Neg liver ROS,   Endo/Other  diabetes, Poorly Controlled, Type 2, Insulin Dependent  Renal/GU ESRF and CRFRenal disease     Musculoskeletal   Abdominal   Peds  Hematology negative hematology ROS (+)   Anesthesia Other Findings   Reproductive/Obstetrics negative OB ROS                          Anesthesia Physical Anesthesia Plan  ASA: III  Anesthesia Plan: General   Post-op Pain Management:    Induction: Intravenous  Airway Management Planned: Oral ETT and LMA  Additional Equipment:   Intra-op Plan:   Post-operative Plan: Extubation in OR  Informed Consent: I have reviewed the patients History and Physical, chart, labs and discussed the procedure including the risks, benefits and alternatives for the proposed anesthesia with the patient or authorized representative who has indicated his/her understanding and acceptance.     Plan Discussed with: CRNA, Anesthesiologist and Surgeon  Anesthesia Plan Comments:         Anesthesia Quick Evaluation

## 2012-10-01 NOTE — Anesthesia Postprocedure Evaluation (Signed)
  Anesthesia Post-op Note  Patient: [name]  Procedure(s) Performed: Procedure(s) with comments: INSERTION OF ARTERIOVENOUS (AV) GORE-TEX GRAFT LEFT UPPER ARM (Left) - Ultrasound guided  Patient Location: PACU  Anesthesia Type:General  Level of Consciousness: awake, oriented and patient cooperative  Airway and Oxygen Therapy: Patient Spontanous Breathing  Post-op Pain: mild  Post-op Assessment: Post-op Vital signs reviewed, Patient's Cardiovascular Status Stable, Respiratory Function Stable, Patent Airway and No signs of Nausea or vomiting  Post-op Vital Signs: stable  Complications: No apparent anesthesia complications

## 2012-10-01 NOTE — H&P (View-Only) (Signed)
[facility] [place]  Postoperative Access Visit  History of Present Illness  [name] is a 62 y.o. 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  The patient's wounds are healed.  The patient notes no steal symptoms.  The patient is able to complete their activities of daily living.  The patient's current symptoms are: none.  Past Medical History  Diagnosis Date  . Diabetes mellitus   . Hypertension   . CHF (congestive heart failure)   . Hyperlipidemia   . Chronic kidney disease   . Thyroid disease   . Depression   . Shortness of breath     at night  . Arthritis     Past Surgical History  Procedure Laterality Date  . Arteriovenous graft placement      left upper extremity  . Tubal ligation      x2  . Av fistula placement Left 07/02/2012    Procedure: ARTERIOVENOUS (AV) FISTULA CREATION;  Surgeon: Fransisco Hertz, MD;  Location: [facility] OR;  Service: Vascular;  Laterality: Left;    History   Social History  . Marital Status: Divorced    Spouse Name: N/A    Number of Children: N/A  . Years of Education: N/A   Occupational History  . Not on file.   Social History Main Topics  . Smoking status: Never Smoker   . Smokeless tobacco: Never Used  . Alcohol Use: No  . Drug Use: No  . Sexually Active: Not on file   Other Topics Concern  . Not on file   Social History Narrative  . No narrative on file    Family History  Problem Relation Age of Onset  . Diabetes Father   . Stroke Father   . Heart disease Mother      Current Outpatient Prescriptions on File Prior to Visit  Medication Sig Dispense Refill  . amitriptyline (ELAVIL) 25 MG tablet Take 25 mg by mouth at bedtime.      Marland Kitchen amLODipine (NORVASC) 5 MG tablet Take 5 mg by mouth daily.        Marland Kitchen aspirin 81 MG tablet Take 81 mg by mouth daily.        . calcitRIOL (ROCALTROL) 0.25 MCG capsule Take 0.25 mcg by mouth daily.        . carvedilol  (COREG) 25 MG tablet Take 25 mg by mouth 2 (two) times daily with a meal.        . furosemide (LASIX) 80 MG tablet Take 80 mg by mouth daily.        Marland Kitchen gabapentin (NEURONTIN) 600 MG tablet Take 600 mg by mouth 3 (three) times daily.        . hydrALAZINE (APRESOLINE) 50 MG tablet Take 50 mg by mouth 3 (three) times daily.      . Insulin Glargine (LANTUS [place]) Inject 20 Units into the skin at bedtime.        Marland Kitchen oxyCODONE (ROXICODONE) 5 MG immediate release tablet Take 1 tablet (5 mg total) by mouth every 6 (six) hours as needed for pain.  30 tablet  0  . quinapril (ACCUPRIL) 20 MG tablet Take 20 mg by mouth daily.       No current facility-administered medications on file prior to visit.    No Known Allergies  REVIEW OF SYSTEMS:  (Positives checked otherwise negative)  CARDIOVASCULAR:  []  chest pain, []  chest pressure, []  palpitations, []  shortness  of breath when laying flat, []  shortness of breath with exertion,  []  pain in feet when walking, []  pain in feet when laying flat, []  history of blood clot in veins (DVT), []  history of phlebitis, []  swelling in legs, []  varicose veins  PULMONARY:  []  productive cough, []  asthma, []  wheezing  NEUROLOGIC:  []  weakness in arms or legs, []  numbness in arms or legs, []  difficulty speaking or slurred speech, []  temporary loss of vision in one eye, []  dizziness  HEMATOLOGIC:  []  bleeding problems, []  problems with blood clotting too easily  MUSCULOSKEL:  []  joint pain, []  joint swelling  GASTROINTEST:  []  vomiting blood, []  blood in stool     GENITOURINARY:  []  burning with urination, []  blood in urine, [x]  urine production, [x]  CKD stage V  PSYCHIATRIC:  []  history of major depression  INTEGUMENTARY:  []  rashes, []  ulcers  For VQI Use Only  PRE-ADM LIVING: Home  AMB STATUS: Ambulatory  Physical Examination Filed Vitals:   09/14/12 1133  BP: 204/71  Pulse: 77   PULM CTAB, sym exp, good air movement CV: Rrr, nl s1,s2, -m/r/t/g LUE:  Incision is healed, skin feels warm, hand grip is 5/5, sensation in digits is intact, no palpable thrill, bruit can not be auscultated   Medical Decision Making  [name] is a 62 y.o. 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  As she is nearing ESRD per pt, I would go ahead and proceed with the LUA AVG.  A looped LUA AVG may be needed as the brachial artery was found to be diseased distally.    Risk, benefits, and alternatives to access surgery were discussed.  The patient is aware the risks include but are not limited to: bleeding, infection, steal syndrome, nerve damage, ischemic monomelic neuropathy, failure to mature, need for additional procedures, death and stroke.  The patient agrees to proceed forward with the procedure.  She is scheduled for 7 JUL 14.  Thank you for allowing [place] to participate in this patient's care.  Leonides Sake, MD [facility] [place] Office: [phone] Pager: [phone]  09/14/2012, 11:59 AM

## 2012-10-01 NOTE — Transfer of Care (Signed)
Immediate Anesthesia Transfer of Care Note  Patient: [name]  Procedure(s) Performed: Procedure(s) with comments: INSERTION OF ARTERIOVENOUS (AV) GORE-TEX GRAFT LEFT UPPER ARM (Left) - Ultrasound guided  Patient Location: PACU  Anesthesia Type:General  Level of Consciousness: sedated and patient cooperative  Airway & Oxygen Therapy: Patient Spontanous Breathing and Patient connected to nasal cannula oxygen  Post-op Assessment: Report given to PACU RN and Post -op Vital signs reviewed and stable  Post vital signs: Reviewed and stable  Complications: No apparent anesthesia complications

## 2012-10-02 ENCOUNTER — Other Ambulatory Visit: Payer: Self-pay

## 2012-10-02 ENCOUNTER — Telehealth: Payer: Self-pay | Admitting: Vascular Surgery

## 2012-10-02 ENCOUNTER — Encounter (HOSPITAL_COMMUNITY): Payer: Self-pay | Admitting: Vascular Surgery

## 2012-10-02 DIAGNOSIS — N186 End stage renal disease: Secondary | ICD-10-CM

## 2012-10-02 DIAGNOSIS — Z48812 Encounter for surgical aftercare following surgery on the circulatory system: Secondary | ICD-10-CM

## 2012-10-02 NOTE — Telephone Encounter (Signed)
lvm re appt info, sent letter - kf °

## 2012-10-02 NOTE — Telephone Encounter (Signed)
Message copied by Margaretmary Eddy on Tue Oct 02, 2012  9:16 AM ------      Message from: Phillips Odor      Created: Tue Oct 02, 2012  9:03 AM       I WILL PLACE THE ORDER.            ----- Message -----         From: Fransisco Hertz, MD         Sent: 10/01/2012  12:32 PM           To: [name], Melene Plan, RN            [name]      [id number]      08-31-1950            PROCEDURE:  left upper arm looped (brachioaxillary) arteriovenous graft             Follow-up: 4 weeks            Orders(s) for follow-up: L arm arterial duplex             ------

## 2012-10-07 ENCOUNTER — Telehealth: Payer: Self-pay | Admitting: Vascular Surgery

## 2012-10-07 NOTE — Telephone Encounter (Signed)
Called by pt for arm swelling post upper arm graft 6 days ago.  No numbness or tingling in hand.  No drainage.  Swelling is over graft.  No fever.  Advised pt probably goretex reaction.    Elevate arm.  Ice pack.  Call if worse or febrile.  Fabienne Bruns, MD [facility] [place] Office: [phone] Pager: [phone]

## 2012-10-25 ENCOUNTER — Encounter: Payer: Self-pay | Admitting: Diagnostic Radiology

## 2012-10-25 NOTE — ED Provider Notes (Signed)
Order(s) created erroneously. Erroneous order ID: [id number] Order moved by: Archie Endo B Order move date/time: 10/25/2012 10:15 AM Source Patient:   [id number] Source Contact: 01/27/2011 Destination Patient:   [id number] Destination Contact: 10/05/2012

## 2012-11-01 ENCOUNTER — Encounter: Payer: Self-pay | Admitting: Vascular Surgery

## 2012-11-02 ENCOUNTER — Encounter: Payer: Self-pay | Admitting: Vascular Surgery

## 2012-11-02 ENCOUNTER — Encounter (INDEPENDENT_AMBULATORY_CARE_PROVIDER_SITE_OTHER): Payer: Medicaid Other | Admitting: *Deleted

## 2012-11-02 ENCOUNTER — Ambulatory Visit (INDEPENDENT_AMBULATORY_CARE_PROVIDER_SITE_OTHER): Payer: Medicaid Other | Admitting: Vascular Surgery

## 2012-11-02 VITALS — BP 170/57 | HR 66 | Temp 97.8°F | Ht 62.0 in | Wt 150.5 lb

## 2012-11-02 DIAGNOSIS — N186 End stage renal disease: Secondary | ICD-10-CM

## 2012-11-02 DIAGNOSIS — Z4931 Encounter for adequacy testing for hemodialysis: Secondary | ICD-10-CM

## 2012-11-02 DIAGNOSIS — Z48812 Encounter for surgical aftercare following surgery on the circulatory system: Secondary | ICD-10-CM

## 2012-11-02 DIAGNOSIS — T82598A Other mechanical complication of other cardiac and vascular devices and implants, initial encounter: Secondary | ICD-10-CM

## 2012-11-02 NOTE — Progress Notes (Signed)
[facility] [place]  Postoperative Access Visit  History of Present Illness  [name] is a 62 y.o. year old female who presents for postoperative follow-up for: L UA looped AVG (Date: 10/01/12).  The patient's wounds are healed.  The patient notes no steal symptoms.  The patient is able to complete their activities of daily living.  The patient's current symptoms are: none.  For VQI Use Only  PRE-ADM LIVING: Home  AMB STATUS: Ambulatory  Physical Examination Filed Vitals:   11/02/12 1545  BP: 170/57  Pulse: 66  Temp: 97.8 F (36.6 C)    LUE: Incision is healed, skin feels warm, hand grip is 5/5, sensation in digits is intact, palpable thrill, bruit can be auscultated   Medical Decision Making  [name] is a 62 y.o. year old female who presents s/p LUA looped AVG.  The patient's access is ready for use.  Thank you for allowing [place] to participate in this patient's care.  Leonides Sake, MD [facility] [place] Office: [phone] Pager: [phone]  11/02/2012, 4:27 PM

## 2013-05-13 ENCOUNTER — Other Ambulatory Visit: Payer: Self-pay

## 2013-05-13 ENCOUNTER — Other Ambulatory Visit: Payer: Self-pay | Admitting: Vascular Surgery

## 2013-05-13 DIAGNOSIS — I739 Peripheral vascular disease, unspecified: Secondary | ICD-10-CM

## 2013-05-14 ENCOUNTER — Inpatient Hospital Stay (HOSPITAL_COMMUNITY): Admission: RE | Admit: 2013-05-14 | Payer: Medicaid Other | Source: Ambulatory Visit

## 2013-05-14 ENCOUNTER — Ambulatory Visit: Payer: Medicaid Other | Admitting: Vascular Surgery

## 2013-05-18 ENCOUNTER — Telehealth: Payer: Self-pay | Admitting: Internal Medicine

## 2013-05-18 ENCOUNTER — Inpatient Hospital Stay (HOSPITAL_COMMUNITY)
Admission: AD | Admit: 2013-05-18 | Discharge: 2013-05-27 | DRG: 682 | Disposition: A | Discharge status: Skilled Nursing Facility | Payer: Medicare Other | Source: Other Acute Inpatient Hospital | Attending: Internal Medicine | Admitting: Internal Medicine

## 2013-05-18 DIAGNOSIS — I70219 Atherosclerosis of native arteries of extremities with intermittent claudication, unspecified extremity: Secondary | ICD-10-CM

## 2013-05-18 DIAGNOSIS — Z91158 Patient's noncompliance with renal dialysis for other reason: Secondary | ICD-10-CM

## 2013-05-18 DIAGNOSIS — N19 Unspecified kidney failure: Secondary | ICD-10-CM

## 2013-05-18 DIAGNOSIS — A419 Sepsis, unspecified organism: Secondary | ICD-10-CM | POA: Diagnosis not present

## 2013-05-18 DIAGNOSIS — Z79899 Other long term (current) drug therapy: Secondary | ICD-10-CM

## 2013-05-18 DIAGNOSIS — I120 Hypertensive chronic kidney disease with stage 5 chronic kidney disease or end stage renal disease: Secondary | ICD-10-CM | POA: Diagnosis present

## 2013-05-18 DIAGNOSIS — F3289 Other specified depressive episodes: Secondary | ICD-10-CM | POA: Diagnosis present

## 2013-05-18 DIAGNOSIS — E1149 Type 2 diabetes mellitus with other diabetic neurological complication: Secondary | ICD-10-CM | POA: Diagnosis present

## 2013-05-18 DIAGNOSIS — E1121 Type 2 diabetes mellitus with diabetic nephropathy: Secondary | ICD-10-CM

## 2013-05-18 DIAGNOSIS — N058 Unspecified nephritic syndrome with other morphologic changes: Secondary | ICD-10-CM | POA: Diagnosis present

## 2013-05-18 DIAGNOSIS — M949 Disorder of cartilage, unspecified: Secondary | ICD-10-CM

## 2013-05-18 DIAGNOSIS — I509 Heart failure, unspecified: Secondary | ICD-10-CM | POA: Diagnosis present

## 2013-05-18 DIAGNOSIS — J189 Pneumonia, unspecified organism: Secondary | ICD-10-CM | POA: Diagnosis present

## 2013-05-18 DIAGNOSIS — I5032 Chronic diastolic (congestive) heart failure: Secondary | ICD-10-CM | POA: Diagnosis present

## 2013-05-18 DIAGNOSIS — D72829 Elevated white blood cell count, unspecified: Secondary | ICD-10-CM

## 2013-05-18 DIAGNOSIS — G9341 Metabolic encephalopathy: Secondary | ICD-10-CM

## 2013-05-18 DIAGNOSIS — E785 Hyperlipidemia, unspecified: Secondary | ICD-10-CM | POA: Diagnosis present

## 2013-05-18 DIAGNOSIS — Z8673 Personal history of transient ischemic attack (TIA), and cerebral infarction without residual deficits: Secondary | ICD-10-CM

## 2013-05-18 DIAGNOSIS — IMO0002 Reserved for concepts with insufficient information to code with codable children: Secondary | ICD-10-CM

## 2013-05-18 DIAGNOSIS — Z992 Dependence on renal dialysis: Secondary | ICD-10-CM

## 2013-05-18 DIAGNOSIS — I2510 Atherosclerotic heart disease of native coronary artery without angina pectoris: Secondary | ICD-10-CM | POA: Diagnosis present

## 2013-05-18 DIAGNOSIS — G9349 Other encephalopathy: Secondary | ICD-10-CM

## 2013-05-18 DIAGNOSIS — T82898A Other specified complication of vascular prosthetic devices, implants and grafts, initial encounter: Secondary | ICD-10-CM

## 2013-05-18 DIAGNOSIS — N2581 Secondary hyperparathyroidism of renal origin: Secondary | ICD-10-CM | POA: Diagnosis present

## 2013-05-18 DIAGNOSIS — Z7982 Long term (current) use of aspirin: Secondary | ICD-10-CM

## 2013-05-18 DIAGNOSIS — E43 Unspecified severe protein-calorie malnutrition: Secondary | ICD-10-CM | POA: Diagnosis present

## 2013-05-18 DIAGNOSIS — Z794 Long term (current) use of insulin: Secondary | ICD-10-CM

## 2013-05-18 DIAGNOSIS — R5381 Other malaise: Secondary | ICD-10-CM | POA: Diagnosis present

## 2013-05-18 DIAGNOSIS — F329 Major depressive disorder, single episode, unspecified: Secondary | ICD-10-CM | POA: Diagnosis present

## 2013-05-18 DIAGNOSIS — E1129 Type 2 diabetes mellitus with other diabetic kidney complication: Secondary | ICD-10-CM | POA: Diagnosis present

## 2013-05-18 DIAGNOSIS — D649 Anemia, unspecified: Secondary | ICD-10-CM | POA: Diagnosis present

## 2013-05-18 DIAGNOSIS — E1142 Type 2 diabetes mellitus with diabetic polyneuropathy: Secondary | ICD-10-CM | POA: Diagnosis present

## 2013-05-18 DIAGNOSIS — M899 Disorder of bone, unspecified: Secondary | ICD-10-CM | POA: Diagnosis present

## 2013-05-18 DIAGNOSIS — G9340 Encephalopathy, unspecified: Secondary | ICD-10-CM

## 2013-05-18 DIAGNOSIS — N184 Chronic kidney disease, stage 4 (severe): Secondary | ICD-10-CM

## 2013-05-18 DIAGNOSIS — F0390 Unspecified dementia without behavioral disturbance: Secondary | ICD-10-CM | POA: Diagnosis present

## 2013-05-18 DIAGNOSIS — N186 End stage renal disease: Principal | ICD-10-CM

## 2013-05-18 DIAGNOSIS — I739 Peripheral vascular disease, unspecified: Secondary | ICD-10-CM | POA: Diagnosis present

## 2013-05-18 DIAGNOSIS — Z9115 Patient's noncompliance with renal dialysis: Secondary | ICD-10-CM

## 2013-05-18 DIAGNOSIS — E119 Type 2 diabetes mellitus without complications: Secondary | ICD-10-CM

## 2013-05-18 HISTORY — DX: Dependence on renal dialysis: N18.6

## 2013-05-18 HISTORY — DX: Dependence on renal dialysis: Z99.2

## 2013-05-18 LAB — COMPREHENSIVE METABOLIC PANEL
ALBUMIN: 3 g/dL — AB (ref 3.5–5.2)
ALT: 8 U/L (ref 0–35)
AST: 8 U/L (ref 0–37)
Alkaline Phosphatase: 129 U/L — ABNORMAL HIGH (ref 39–117)
BILIRUBIN TOTAL: 0.2 mg/dL — AB (ref 0.3–1.2)
BUN: 75 mg/dL — AB (ref 6–23)
CALCIUM: 9.6 mg/dL (ref 8.4–10.5)
CHLORIDE: 99 meq/L (ref 96–112)
CO2: 21 mEq/L (ref 19–32)
CREATININE: 11.43 mg/dL — AB (ref 0.50–1.10)
GFR calc Af Amer: 4 mL/min — ABNORMAL LOW (ref >90)
GFR calc non Af Amer: 3 mL/min — ABNORMAL LOW (ref >90)
Glucose, Bld: 183 mg/dL — ABNORMAL HIGH (ref 70–99)
Potassium: 4.8 mEq/L (ref 3.7–5.3)
Sodium: 142 mEq/L (ref 137–147)
TOTAL PROTEIN: 7.7 g/dL (ref 6.0–8.3)

## 2013-05-18 LAB — CBC WITH DIFFERENTIAL/PLATELET
BASOS ABS: 0 10*3/uL (ref 0.0–0.1)
BASOS PCT: 0 % (ref 0–1)
EOS ABS: 0.1 10*3/uL (ref 0.0–0.7)
EOS PCT: 2 % (ref 0–5)
HEMATOCRIT: 32.6 % — AB (ref 36.0–46.0)
HEMOGLOBIN: 11.3 g/dL — AB (ref 12.0–15.0)
Lymphocytes Relative: 30 % (ref 12–46)
Lymphs Abs: 2 10*3/uL (ref 0.7–4.0)
MCH: 31.1 pg (ref 26.0–34.0)
MCHC: 34.7 g/dL (ref 30.0–36.0)
MCV: 89.8 fL (ref 78.0–100.0)
MONO ABS: 0.4 10*3/uL (ref 0.1–1.0)
MONOS PCT: 5 % (ref 3–12)
Neutro Abs: 4.4 10*3/uL (ref 1.7–7.7)
Neutrophils Relative %: 63 % (ref 43–77)
Platelets: 263 10*3/uL (ref 150–400)
RBC: 3.63 MIL/uL — ABNORMAL LOW (ref 3.87–5.11)
RDW: 14.2 % (ref 11.5–15.5)
WBC: 6.9 10*3/uL (ref 4.0–10.5)

## 2013-05-18 LAB — MAGNESIUM: Magnesium: 2.8 mg/dL — ABNORMAL HIGH (ref 1.5–2.5)

## 2013-05-18 LAB — GLUCOSE, CAPILLARY: GLUCOSE-CAPILLARY: 179 mg/dL — AB (ref 70–99)

## 2013-05-18 LAB — PHOSPHORUS: Phosphorus: 6.1 mg/dL — ABNORMAL HIGH (ref 2.3–4.6)

## 2013-05-18 MED ORDER — CALCIUM ACETATE 667 MG PO CAPS
1334.0000 mg | ORAL_CAPSULE | Freq: Three times a day (TID) | ORAL | Status: DC
  Filled 2013-05-18: qty 2

## 2013-05-18 MED ORDER — ASPIRIN EC 81 MG PO TBEC
81.0000 mg | DELAYED_RELEASE_TABLET | Freq: Every day | ORAL | Status: DC
  Administered 2013-05-19 – 2013-05-27 (×9): 81 mg via ORAL
  Filled 2013-05-18 (×10): qty 1

## 2013-05-18 MED ORDER — POLYETHYLENE GLYCOL 3350 17 G PO PACK
17.0000 g | PACK | Freq: Every day | ORAL | Status: DC | PRN
  Filled 2013-05-18: qty 1

## 2013-05-18 MED ORDER — CARVEDILOL 25 MG PO TABS
25.0000 mg | ORAL_TABLET | Freq: Two times a day (BID) | ORAL | Status: DC
  Administered 2013-05-19 – 2013-05-22 (×5): 25 mg via ORAL
  Filled 2013-05-18 (×10): qty 1

## 2013-05-18 MED ORDER — ACETAMINOPHEN 325 MG PO TABS
650.0000 mg | ORAL_TABLET | Freq: Four times a day (QID) | ORAL | Status: DC | PRN
  Administered 2013-05-21 – 2013-05-22 (×3): 650 mg via ORAL
  Filled 2013-05-18 (×4): qty 2

## 2013-05-18 MED ORDER — SODIUM CHLORIDE 0.9 % IV SOLN: 250.0000 mL | INTRAVENOUS | Status: DC | PRN

## 2013-05-18 MED ORDER — HEPARIN SODIUM (PORCINE) 5000 UNIT/ML IJ SOLN
5000.0000 [IU] | Freq: Three times a day (TID) | INTRAMUSCULAR | Status: DC
  Administered 2013-05-19 – 2013-05-27 (×25): 5000 [IU] via SUBCUTANEOUS
  Filled 2013-05-18 (×29): qty 1

## 2013-05-18 MED ORDER — CALCITRIOL 0.25 MCG PO CAPS
0.2500 ug | ORAL_CAPSULE | Freq: Every day | ORAL | Status: DC
  Administered 2013-05-19 – 2013-05-22 (×4): 0.25 ug via ORAL
  Filled 2013-05-18 (×6): qty 1

## 2013-05-18 MED ORDER — HYDRALAZINE HCL 20 MG/ML IJ SOLN
5.0000 mg | Freq: Four times a day (QID) | INTRAMUSCULAR | Status: DC | PRN
  Administered 2013-05-19: 5 mg via INTRAVENOUS
  Filled 2013-05-18: qty 1

## 2013-05-18 MED ORDER — INSULIN ASPART 100 UNIT/ML ~~LOC~~ SOLN
0.0000 [IU] | SUBCUTANEOUS | Status: DC
  Administered 2013-05-19: 3 [IU] via SUBCUTANEOUS
  Administered 2013-05-19: 1 [IU] via SUBCUTANEOUS
  Administered 2013-05-19: 2 [IU] via SUBCUTANEOUS
  Administered 2013-05-20 (×2): 1 [IU] via SUBCUTANEOUS
  Administered 2013-05-20: 2 [IU] via SUBCUTANEOUS
  Administered 2013-05-21: 1 [IU] via SUBCUTANEOUS
  Administered 2013-05-21: 3 [IU] via SUBCUTANEOUS
  Administered 2013-05-22: 5 [IU] via SUBCUTANEOUS
  Administered 2013-05-22: 2 [IU] via SUBCUTANEOUS
  Administered 2013-05-22: 5 [IU] via SUBCUTANEOUS
  Administered 2013-05-22: 3 [IU] via SUBCUTANEOUS
  Administered 2013-05-22: 5 [IU] via SUBCUTANEOUS
  Administered 2013-05-23: 2 [IU] via SUBCUTANEOUS
  Administered 2013-05-23: 5 [IU] via SUBCUTANEOUS
  Administered 2013-05-23 (×2): 3 [IU] via SUBCUTANEOUS
  Administered 2013-05-24: 2 [IU] via SUBCUTANEOUS
  Administered 2013-05-24: 3 [IU] via SUBCUTANEOUS
  Administered 2013-05-24: 1 [IU] via SUBCUTANEOUS
  Administered 2013-05-24 (×2): 3 [IU] via SUBCUTANEOUS
  Administered 2013-05-25: 5 [IU] via SUBCUTANEOUS
  Administered 2013-05-25: 1 [IU] via SUBCUTANEOUS
  Administered 2013-05-25: 2 [IU] via SUBCUTANEOUS
  Administered 2013-05-26: 1 [IU] via SUBCUTANEOUS
  Administered 2013-05-26: 5 [IU] via SUBCUTANEOUS
  Administered 2013-05-26 (×2): 2 [IU] via SUBCUTANEOUS
  Administered 2013-05-27: 5 [IU] via SUBCUTANEOUS
  Administered 2013-05-27: 2 [IU] via SUBCUTANEOUS

## 2013-05-18 MED ORDER — ONDANSETRON HCL 4 MG/2ML IJ SOLN: 4.0000 mg | Freq: Four times a day (QID) | INTRAMUSCULAR | Status: DC | PRN

## 2013-05-18 MED ORDER — HYDRALAZINE HCL 50 MG PO TABS
50.0000 mg | ORAL_TABLET | Freq: Three times a day (TID) | ORAL | Status: DC
Start: 2013-05-18 — End: 2013-05-20
  Administered 2013-05-19 (×2): 50 mg via ORAL
  Filled 2013-05-18 (×8): qty 1

## 2013-05-18 MED ORDER — INSULIN GLARGINE 100 UNIT/ML ~~LOC~~ SOLN
10.0000 [IU] | Freq: Every day | SUBCUTANEOUS | Status: DC
  Administered 2013-05-19 – 2013-05-22 (×4): 10 [IU] via SUBCUTANEOUS
  Filled 2013-05-18 (×6): qty 0.1

## 2013-05-18 MED ORDER — AMLODIPINE BESYLATE 5 MG PO TABS
5.0000 mg | ORAL_TABLET | Freq: Every day | ORAL | Status: DC
  Filled 2013-05-18 (×2): qty 1

## 2013-05-18 MED ORDER — SODIUM CHLORIDE 0.9 % IJ SOLN
3.0000 mL | INTRAMUSCULAR | Status: DC | PRN
  Administered 2013-05-21 – 2013-05-22 (×2): 3 mL via INTRAVENOUS

## 2013-05-18 MED ORDER — ACETAMINOPHEN 650 MG RE SUPP: 650.0000 mg | Freq: Four times a day (QID) | RECTAL | Status: DC | PRN

## 2013-05-18 MED ORDER — SODIUM CHLORIDE 0.9 % IJ SOLN
3.0000 mL | Freq: Two times a day (BID) | INTRAMUSCULAR | Status: DC
  Administered 2013-05-19 – 2013-05-26 (×13): 3 mL via INTRAVENOUS
  Administered 2013-05-27: 10 mL via INTRAVENOUS

## 2013-05-18 MED ORDER — SODIUM CHLORIDE 0.9 % IJ SOLN: 3.0000 mL | Freq: Two times a day (BID) | INTRAMUSCULAR | Status: DC

## 2013-05-18 MED ORDER — ONDANSETRON HCL 4 MG PO TABS: 4.0000 mg | ORAL_TABLET | Freq: Four times a day (QID) | ORAL | Status: DC | PRN

## 2013-05-18 NOTE — Telephone Encounter (Signed)
PENDING ACCEPTANCE TRANFER NOTE:  Call received from:    Dr. Graylon Good in [place] REQUESTING TRANSFER:    Missed dialysis  HPI:   63 years old [place] American female with past medical history of ESRD, DM, HTN and CAD came into the hospital because of confusion. Per family she is dialysis is Monday secondary to weather and issues with transportation. Initially blood pressure was elevated at 027-253 for systolic blood pressure that responded to oral clonidine very well. Vitals sats 98% on 2 L, respirations 20, heart rate 77 and blood pressure is 154/68. Potassium 4.2, BUN 70 creatinine 11.5 glucose 200.   PLAN:  According to telephone report, this patient was accepted for transfer to [facility],   Under [facility] team:  10,  I have requested an order be written to call Flow Manager at [phone] upon patient arrival to the floor for final physician assignment who will do the admission and give admitting orders.  SIGNED: Birdie Hopes, MD [facility]  05/18/2013, 1:27 PM

## 2013-05-18 NOTE — H&P (Signed)
[facility] History and Physical  [name] EHU:[id number] DOB: 02/26/1951 DOA: 05/18/2013  Referring physician: Dr. Graylon Good PCP: Marco Collie, MD   Chief Complaint: confusion  HPI: [name] is a 63 y.o. female  Past medical history of end-stage renal disease dialyzes Tuesday Thursdays and Saturdays, diabetes hypertension and coronary artery disease that was brought to the hospital due to confusion. As per granddaughter as patient cannot give a history she has not had dialysis for over a week as she couldn't get transportation. She was found the day prior to admission confused but when the granddaughter went to the house today she incoherent and not making any sense they took her to [facility]. As per granddaughter the patient had been complaining of nausea and vomiting 3 days prior to admission. Her vitals were 98% on 2 L respirations 20 heart rate 77 blood pressure 154/68 potassium 4.2 BUN of 70 creatinine of 11.5 and a glucose of 200 so she was transferred to call for further evaluation.   Review of Systems:  Unable to obtain as granddaughter and patient cannot cannot provide any further details.  Past Medical History  Diagnosis Date  . Diabetes mellitus   . Hypertension   . CHF (congestive heart failure)   . Hyperlipidemia   . Chronic kidney disease   . Thyroid disease   . Depression   . Shortness of breath     at night  . Arthritis   . Dementia   . Stroke     2013, weakness- all over  . Constipation   . Vision disturbance    Past Surgical History  Procedure Laterality Date  . Arteriovenous graft placement      left upper extremity  . Tubal ligation      x2  . Av fistula placement Left 07/02/2012    Procedure: ARTERIOVENOUS (AV) FISTULA CREATION;  Surgeon: Conrad Switzer, MD;  Location: [place];  Service: Vascular;  Laterality: Left;  . Av fistula placement Left 10/01/2012    Procedure: INSERTION OF ARTERIOVENOUS (AV) GORE-TEX GRAFT LEFT UPPER ARM;   Surgeon: Conrad Cankton, MD;  Location: [place];  Service: Vascular;  Laterality: Left;  Ultrasound guided   Social History:  reports that she has never smoked. She has never used smokeless tobacco. She reports that she does not drink alcohol or use illicit drugs.  No Known Allergies  Family History  Problem Relation Age of Onset  . Diabetes Father   . Stroke Father   . Heart disease Mother      Prior to Admission medications   Medication Sig Start Date End Date Taking? Authorizing Provider  amitriptyline (ELAVIL) 25 MG tablet Take 25 mg by mouth at bedtime.    Historical Provider, MD  amLODipine (NORVASC) 5 MG tablet Take 5 mg by mouth daily.      Historical Provider, MD  aspirin 81 MG tablet Take 81 mg by mouth daily.      Historical Provider, MD  calcitRIOL (ROCALTROL) 0.25 MCG capsule Take 0.25 mcg by mouth daily.      Historical Provider, MD  carvedilol (COREG) 25 MG tablet Take 25 mg by mouth 2 (two) times daily with a meal.      Historical Provider, MD  furosemide (LASIX) 80 MG tablet Take 80 mg by mouth daily.      Historical Provider, MD  gabapentin (NEURONTIN) 600 MG tablet Take 600 mg by mouth 3 (three) times daily.      Historical Provider, MD  hydrALAZINE (APRESOLINE) 50 MG tablet Take 50 mg by mouth 3 (three) times daily.    Historical Provider, MD  Insulin Glargine (LANTUS [place]) Inject 20 Units into the skin at bedtime.      Historical Provider, MD  oxyCODONE (ROXICODONE) 5 MG immediate release tablet Take 1-2 tablets (5-10 mg total) by mouth every 4 (four) hours as needed for pain. 10/01/12   Regina J Roczniak, PA-C  quinapril (ACCUPRIL) 20 MG tablet Take 20 mg by mouth daily.    Historical Provider, MD   Physical Exam: Filed Vitals:   05/18/13 1550  BP: 118/57  Pulse: 71  Temp: 97.2 F (36.2 C)  Resp: 16    BP 118/57  Pulse 71  Temp(Src) 97.2 F (36.2 C) (Axillary)  Resp 16  SpO2 100%  General:  Appears calm and comfortable Eyes: PERRL, normal lids, irises &  conjunctiva ENT: grossly normal hearing, lips & tongue, + JVD Neck: no LAD, masses or thyromegaly Cardiovascular: RRR, no rubs or murmurs Telemetry: SR, no arrhythmias  Respiratory: CTA bilaterally, no w/r/r. Normal respiratory effort. Abdomen: soft, ntnd Musculoskeletal: grossly normal tone BUE/BLE Psychiatric: grossly normal mood and affect, speech fluent and appropriate Neurologic: grossly non-focal.          Labs on Admission:  Basic Metabolic Panel: No results found for this basename: NA, K, CL, CO2, GLUCOSE, BUN, CREATININE, CALCIUM, MG, PHOS,  in the last 168 hours Liver Function Tests: No results found for this basename: AST, ALT, ALKPHOS, BILITOT, PROT, ALBUMIN,  in the last 168 hours No results found for this basename: LIPASE, AMYLASE,  in the last 168 hours No results found for this basename: AMMONIA,  in the last 168 hours CBC: No results found for this basename: WBC, NEUTROABS, HGB, HCT, MCV, PLT,  in the last 168 hours Cardiac Enzymes: No results found for this basename: CKTOTAL, CKMB, CKMBINDEX, TROPONINI,  in the last 168 hours  BNP (last 3 results) No results found for this basename: PROBNP,  in the last 8760 hours CBG: No results found for this basename: GLUCAP,  in the last 168 hours  Radiological Exams on Admission: No results found.  EKG: Independently reviewed. pending  Assessment/Plan Uremic encephalopathy/ End stage renal disease: - The most likely cause will be uremic encephalopathy, as she has missed her dialysis for over 1  week.  I will get a stat EKG although a basic metabolic panel was done at  [facility] that show potassium of 4.2. We'll get stat basic metabolic panel and a CBC. - The granddaughter relates that there is no history of fevers, skin sores, cough or shortness of breath. - Consult renal for possible dialysis. She doesn't have a cardiac rub.  Hypertension: - She will be n.p.o. do to her encephalopathy, please hydralazine when necessary  for systolic blood pressure greater than 180.  Diabetes mellitus/ diabetic nephropathy: - The patient is currently n.p.o., 1/3 of of levemir home dose, start her on sliding scale insulin for n.p.o. protocol, every 4 hours CBGs.  Code Status: full Family Communication: granddaughter Disposition Plan: inpatient  Time spent: 83 minutes  Charlynne Cousins [facility] Pager [phone]

## 2013-05-18 NOTE — Consult Note (Signed)
[name] Renal Consultation Note    Indication for Consultation:  Management of ESRD/hemodialysis; anemia, hypertension/volume and secondary hyperparathyroidism  HPI: [name] is [name] 63 y.o. female ESRD patient (TTS HD) with past medical history significant for hypertension, DM and CAD who presented to the [name] ED after her granddaughter found her incoherent and confused.  She has not had dialysis since 2/16 secondary to transportation issues. Family is present and tells me that they were notified at the last minute she would not be transported to dialysis. Per outpatient notes, she declined treatment and stated that she would wait until Saturday.  At the time of this encounter she is heavily sedated and unable to participate in ROS.  I am told that she was confused but talking earlier today at [name] but was given medication in attempts to calm her down while IV access could be established.   Past Medical History  Diagnosis Date  . Diabetes mellitus   . Hypertension   . CHF (congestive heart failure)   . Hyperlipidemia   . Chronic kidney disease   . Thyroid disease   . Depression   . Shortness of breath     at night  . Arthritis   . Dementia   . Stroke     2013, weakness- all over  . Constipation   . Vision disturbance    Past Surgical History  Procedure Laterality Date  . Arteriovenous graft placement      left upper extremity  . Tubal ligation      x2  . Av fistula placement Left 07/02/2012    Procedure: ARTERIOVENOUS (AV) FISTULA CREATION;  Surgeon: Conrad Reyno, MD;  Location: [place];  Service: Vascular;  Laterality: Left;  . Av fistula placement Left 10/01/2012    Procedure: INSERTION OF ARTERIOVENOUS (AV) GORE-TEX GRAFT LEFT UPPER ARM;  Surgeon: Conrad [place], MD;  [facility];  Service: Vascular;  Laterality: Left;  Ultrasound guided   Family History  Problem Relation Age of Onset  . Diabetes Father   . Stroke Father   . Heart disease  Mother    Social History:  reports that she has never smoked. She has never used smokeless tobacco. She reports that she does not drink alcohol or use illicit drugs. No Known Allergies Prior to Admission medications   Medication Sig Start Date End Date Taking? Authorizing Provider  amitriptyline (ELAVIL) 25 MG tablet Take 25 mg by mouth at bedtime.    Historical Provider, MD  amLODipine (NORVASC) 5 MG tablet Take 5 mg by mouth daily.      Historical Provider, MD  aspirin 81 MG tablet Take 81 mg by mouth daily.      Historical Provider, MD  calcitRIOL (ROCALTROL) 0.25 MCG capsule Take 0.25 mcg by mouth daily.      Historical Provider, MD  carvedilol (COREG) 25 MG tablet Take 25 mg by mouth 2 (two) times daily with [name] meal.      Historical Provider, MD  furosemide (LASIX) 80 MG tablet Take 80 mg by mouth daily.      Historical Provider, MD  gabapentin (NEURONTIN) 600 MG tablet Take 600 mg by mouth 3 (three) times daily.      Historical Provider, MD  hydrALAZINE (APRESOLINE) 50 MG tablet Take 50 mg by mouth 3 (three) times daily.    Historical Provider, MD  Insulin Glargine (LANTUS [place]) Inject 20 Units into the skin at bedtime.      Historical Provider, MD  oxyCODONE (ROXICODONE) 5 MG immediate release tablet Take 1-2 tablets (5-10 mg total) by mouth every 4 (four) hours as needed for pain. 10/01/12   Regina J Roczniak, PA-C  quinapril (ACCUPRIL) 20 MG tablet Take 20 mg by mouth daily.    Historical Provider, MD   Current Facility-Administered Medications  Medication Dose Route Frequency Provider Last Rate Last Dose  . 0.9 %  sodium chloride infusion  250 mL Intravenous PRN Charlynne Cousins, MD      . acetaminophen (TYLENOL) tablet 650 mg  650 mg Oral Q6H PRN Charlynne Cousins, MD       Or  . acetaminophen (TYLENOL) suppository 650 mg  650 mg Rectal Q6H PRN Charlynne Cousins, MD      . amLODipine (NORVASC) tablet 5 mg  5 mg Oral Daily Charlynne Cousins, MD      . aspirin EC tablet 81 mg   81 mg Oral Daily Charlynne Cousins, MD      . calcitRIOL (ROCALTROL) capsule 0.25 mcg  0.25 mcg Oral Daily Charlynne Cousins, MD      . carvedilol (COREG) tablet 25 mg  25 mg Oral BID WC Charlynne Cousins, MD      . heparin injection 5,000 Units  5,000 Units Subcutaneous 3 times per day Charlynne Cousins, MD      . hydrALAZINE (APRESOLINE) injection 5 mg  5 mg Intravenous Q6H PRN Charlynne Cousins, MD      . hydrALAZINE (APRESOLINE) tablet 50 mg  50 mg Oral TID Charlynne Cousins, MD      . insulin aspart (novoLOG) injection 0-9 Units  0-9 Units Subcutaneous 6 times per day Charlynne Cousins, MD      . insulin glargine (LANTUS) injection 10 Units  10 Units Subcutaneous QHS Charlynne Cousins, MD      . ondansetron [name]) tablet 4 mg  4 mg Oral Q6H PRN Charlynne Cousins, MD       Or  . ondansetron [name]) injection 4 mg  4 mg Intravenous Q6H PRN Charlynne Cousins, MD      . polyethylene glycol (MIRALAX / [name]) packet 17 g  17 g Oral Daily PRN Charlynne Cousins, MD      . sodium chloride 0.9 % injection 3 mL  3 mL Intravenous Q12H Charlynne Cousins, MD      . sodium chloride 0.9 % injection 3 mL  3 mL Intravenous Q12H Charlynne Cousins, MD      . sodium chloride 0.9 % injection 3 mL  3 mL Intravenous PRN Charlynne Cousins, MD       Studies/Results: No results found.  ROS: Pt unable to participate due to sedation   Physical Exam: Filed Vitals:   05/18/13 1550  BP: 118/57  Pulse: 71  Temp: 97.2 F (36.2 C)  TempSrc: Axillary  Resp: 16  SpO2: 100%     General: Frail, chronically ill-appearing, in no acute distress. Head: Normocephalic, atraumatic, sclera non-icteric, mucus membranes are moist, dentition in poor repair Neck: Supple. JVD noted Lungs: Mostly clear anteriorly, no appreciable wheezes/rhonchi. Breathing is unlabored. Heart: RRR with S1 S2. No murmurs, rubs, or gallops appreciated. Abdomen: Soft, non-tender, non-distended with normoactive  bowel sounds. No rebound/guarding. No obvious abdominal masses. M-S:  Strength and tone appear normal for age. Lower extremities: No LE edema, ischemic changes to rt mid foot/toes.  Neuro: Sedated Psych:  Sedated Dialysis Access: L AVG + bruit  Dialysis Orders:  [name]: [facility] on TTS . F 160 EDW 59.5 kg HD Bath 2K/2Ca  Time 3:45 Heparin 6000. Access L AVG  BFR 350 DFR A1.5    Hectorol 2 mcg IV/HD Epogen 1200   Units IV/HD  Venofer  0  Recent labs:  Hgb 11.1, Tsat 59%, Ferr 1192, P 6.8, Last PTH 331  Assessment/Plan: 1. Encephalopathy - Per primary. Most likely uremic. No HD x 1 week.  2. ESRD -  TTS, last tx was Monday 2/16. K+ 4.2  BUN 70/Cr 11.5. Emergent HD this evening. 3. Hypertension/volume  - SBPs 150s at [place], 110s here. Home Coreg 25 BID and Norvasc 5 ordered. At EDW after last op tx. JVD noted. No wgts here. UF to dry weight with HD. 4. Anemia  - Hgb 11.3 consistent with op labs. Last Tsat 59%. Hold ESA's for now and follow CBC 5. Metabolic bone disease -  Ca 9.6 (10.4 corrected). Phos poorly controlled op on Phoslo 2 ac (question compliance). Last PTH 331. Hold phoslo 2 ac and hectorol 2 for hypercalcemia. Continue low Ca bath. May need non Ca binder at d/c. Follow labs 6. Nutrition - Alb 3.0. NPO given #1 above. Renal diet when appropriate. Multivitamin 7. Rt foot ischemic changes - Foot pain noted per family and op records. Missed [facility] appt on 2/17 for ABIs. Will consult [facility] in the [name].m for possible eval while here. 8. DM - insulin per primary  Kelly Alamo, PA-C [place] Pager [phone] 05/18/2013, 6:26 PM   WBC 6.9 RBC 3.63 Hemoglobin 11.3 HCT 32.6 Platelets 263 Sodium 142 Potassium 4.8 Chloride 99 CO2 21 BUN 75 Creatinine 11.43 Calcium 9.6 Glucose 183  I have seen and examined this patient and agree with plan as outlined by Kelly Alamo, PA-C.  Pt is sedated from procedure at an outside hospital and could not answer questions.  Pt's family was at her bedside  and reports that they didn't take her to HD due to confusion/AMS "her speech was blurred".  Plan for HD today and follow her mental status.  K is stable and volume status is ok. Kelly Nakamura A,MD 05/18/2013 7:49 PM

## 2013-05-19 ENCOUNTER — Inpatient Hospital Stay (HOSPITAL_COMMUNITY): Payer: Medicare Other

## 2013-05-19 DIAGNOSIS — D72829 Elevated white blood cell count, unspecified: Secondary | ICD-10-CM | POA: Diagnosis present

## 2013-05-19 LAB — GLUCOSE, CAPILLARY
GLUCOSE-CAPILLARY: 136 mg/dL — AB (ref 70–99)
Glucose-Capillary: 165 mg/dL — ABNORMAL HIGH (ref 70–99)
Glucose-Capillary: 111 mg/dL — ABNORMAL HIGH (ref 70–99)
Glucose-Capillary: 221 mg/dL — ABNORMAL HIGH (ref 70–99)
Glucose-Capillary: 186 mg/dL — ABNORMAL HIGH (ref 70–99)
Glucose-Capillary: 203 mg/dL — ABNORMAL HIGH (ref 70–99)

## 2013-05-19 LAB — CBC
HCT: 37.1 % (ref 36.0–46.0)
HEMOGLOBIN: 12.9 g/dL (ref 12.0–15.0)
MCH: 31.1 pg (ref 26.0–34.0)
MCHC: 34.8 g/dL (ref 30.0–36.0)
MCV: 89.4 fL (ref 78.0–100.0)
PLATELETS: 285 10*3/uL (ref 150–400)
RBC: 4.15 MIL/uL (ref 3.87–5.11)
RDW: 14.2 % (ref 11.5–15.5)
WBC: 13 10*3/uL — AB (ref 4.0–10.5)

## 2013-05-19 LAB — MRSA PCR SCREENING: MRSA BY PCR: NEGATIVE

## 2013-05-19 MED ORDER — HALOPERIDOL LACTATE 5 MG/ML IJ SOLN: 2.0000 mg | Freq: Four times a day (QID) | INTRAMUSCULAR | Status: DC | PRN

## 2013-05-19 MED ORDER — FUROSEMIDE 80 MG PO TABS
80.0000 mg | ORAL_TABLET | Freq: Every day | ORAL | Status: DC
  Administered 2013-05-19 – 2013-05-20 (×2): 80 mg via ORAL
  Filled 2013-05-19 (×2): qty 1

## 2013-05-19 MED ORDER — HALOPERIDOL LACTATE 5 MG/ML IJ SOLN
2.0000 mg | Freq: Four times a day (QID) | INTRAMUSCULAR | Status: DC | PRN
  Administered 2013-05-19 (×2): 2 mg via INTRAVENOUS
  Filled 2013-05-19 (×2): qty 1

## 2013-05-19 MED ORDER — MORPHINE SULFATE 2 MG/ML IJ SOLN
2.0000 mg | Freq: Once | INTRAMUSCULAR | Status: AC
  Administered 2013-05-19: 2 mg via INTRAVENOUS
  Filled 2013-05-19: qty 1

## 2013-05-19 MED ORDER — QUINAPRIL HCL 10 MG PO TABS
20.0000 mg | ORAL_TABLET | Freq: Every day | ORAL | Status: DC
  Administered 2013-05-19 – 2013-05-22 (×4): 20 mg via ORAL
  Filled 2013-05-19 (×4): qty 2

## 2013-05-19 MED ORDER — HALOPERIDOL 2 MG PO TABS
2.0000 mg | ORAL_TABLET | Freq: Once | ORAL | Status: DC
  Filled 2013-05-19: qty 1

## 2013-05-19 MED ORDER — AMLODIPINE BESYLATE 10 MG PO TABS
10.0000 mg | ORAL_TABLET | Freq: Every day | ORAL | Status: DC
  Administered 2013-05-20 – 2013-05-22 (×3): 10 mg via ORAL
  Filled 2013-05-19 (×3): qty 1

## 2013-05-19 NOTE — Progress Notes (Signed)
Patient HR has been trending up since MN with HR as high as 140's.  B/P at 0404 was 206/95 HR was 125.  Patient has become more awake during the night.  She has also become more restless and agitated.  At first, she would answer some questions (for example, "where are you at?"  Her reply, "Hospital").  But when asked further questions, she would not respond.  At one point during the night, she could be heard calling from her room that she needed to pee.  But she did not understand the concept of using a bedpan.  When attempting to retake BP, she would become more agitated.  Per admission note, she does has ischemic changes in the right foot.  While on the phone with Dr. Philbert Riser, I reassessed the patient.  She was calling out in pain with her right foot.  She also thought she was at home.  She wanted [place] to pull her foot off.  This is the first time that she has complained of pain in her right foot.  Dr. Philbert Riser wrote for pain medication IV.  He also asked that we attempt to put patient in sitting position with her feet lower.  Once patient become more calm, we will attempt to get another blood pressure.  Will continue to monitor the patient and administer orders.  Also, home medications found in a plastic bag in the patient's room.  These were counted out and taken to pharmacy for safe keeping.  Kaneisha Ellenberger RN-BC, [place].

## 2013-05-19 NOTE — Progress Notes (Signed)
I.V Team attempted to get peripheral access,even though the patient was sedated due to haldol,iv team unable to get get peripheral access to her.MD aware.

## 2013-05-19 NOTE — Progress Notes (Signed)
[facility] PROGRESS NOTE Interim History: 63 y.o. female Past medical history of end-stage renal disease dialyzes Tuesday Thursdays and Saturdays, diabetes hypertension and coronary artery disease that was brought to the hospital due to confusion. As per granddaughter as patient cannot give a history she has not had dialysis for over a week as she couldn't get transportation. She was found the day prior to admission confused but when the granddaughter went to the house today she incoherent and not making any sense they took her to [facility]. As per granddaughter the patient had been complaining of nausea and vomiting 3 days prior to admission.   Assessment/Plan: Metabolic encephalopathy/  Uremic encephalopathy: - Now Psycotic & hallucinating after HD, CT head, UA and CXR. ? If infectious in nature now with a mild elevation in her WBC. - She does still make urine. - Haldol prn. As per granddaughter she lived alone and perform her home duties.   Type II or unspecified type diabetes mellitus without mention of complication, not stated as uncontrolled - BG improved. - cont Lantus Plus SSI.  Diabetic nephropathy/Chronic kidney disease, stage IV (severe)/  End stage renal disease - S/p HD. - per renal.  Malignant HTN: - Restart home medications. Tachycardia and HTN most likely due to psycosis. - If ct head negative along with infectious w/u might need MRI.  Code Status: full  Family Communication: granddaughter  Disposition Plan: inpatient    Consultants:  renal  Procedures:  HD  CT head  Antibiotics:  none  HPI/Subjective: Screaming in room saying there is a white man mothering her. [name] is the president.  Objective: Filed Vitals:   05/19/13 0004 05/19/13 0404 05/19/13 0702 05/19/13 0906  BP: 120/57 206/95 183/74 183/67  Pulse: 88 125 115 130  Temp: 97.4 F (36.3 C) 99.3 F (37.4 C)  99.4 F (37.4 C)  TempSrc: Oral Oral  Oral  Resp: 20  24 16 18   Weight:      SpO2: 100% 99% 99% 99%    Intake/Output Summary (Last 24 hours) at 05/19/13 1101 Last data filed at 05/19/13 0900  Gross per 24 hour  Intake      0 ml  Output   1312 ml  Net  -1312 ml   Filed Weights   05/18/13 1938 05/18/13 2341  Weight: 61.2 kg (134 lb 14.7 oz) 59.9 kg (132 lb 0.9 oz)    Exam:  General: Refused, A&O x1.   Data Reviewed: Basic Metabolic Panel:  Recent Labs Lab 05/18/13 1718  NA 142  K 4.8  CL 99  CO2 21  GLUCOSE 183*  BUN 75*  CREATININE 11.43*  CALCIUM 9.6  MG 2.8*  PHOS 6.1*   Liver Function Tests:  Recent Labs Lab 05/18/13 1718  AST 8  ALT 8  ALKPHOS 129*  BILITOT 0.2*  PROT 7.7  ALBUMIN 3.0*   No results found for this basename: LIPASE, AMYLASE,  in the last 168 hours No results found for this basename: AMMONIA,  in the last 168 hours CBC:  Recent Labs Lab 05/18/13 1718 05/19/13 0822  WBC 6.9 13.0*  NEUTROABS 4.4  --   HGB 11.3* 12.9  HCT 32.6* 37.1  MCV 89.8 89.4  PLT 263 285   Cardiac Enzymes: No results found for this basename: CKTOTAL, CKMB, CKMBINDEX, TROPONINI,  in the last 168 hours BNP (last 3 results) No results found for this basename: PROBNP,  in the last 8760 hours CBG:  Recent Labs Lab 05/18/13 1716  05/19/13 0012 05/19/13 0409 05/19/13 0748  GLUCAP 179* 136* 165* 111*    Recent Results (from the past 240 hour(s))  MRSA PCR SCREENING     Status: None   Collection Time    05/19/13  4:54 AM      Result Value Ref Range Status   MRSA by PCR NEGATIVE  NEGATIVE Final   Comment:            The GeneXpert MRSA Assay (FDA     approved for NASAL specimens     only), is one component of a     comprehensive MRSA colonization     surveillance program. It is not     intended to diagnose MRSA     infection nor to guide or     monitor treatment for     MRSA infections.     Studies: No results found.  Scheduled Meds: . amLODipine  5 mg Oral Daily  . aspirin EC  81 mg Oral  Daily  . calcitRIOL  0.25 mcg Oral Daily  . carvedilol  25 mg Oral BID WC  . heparin  5,000 Units Subcutaneous 3 times per day  . hydrALAZINE  50 mg Oral TID  . insulin aspart  0-9 Units Subcutaneous 6 times per day  . insulin glargine  10 Units Subcutaneous QHS  . sodium chloride  3 mL Intravenous Q12H  . sodium chloride  3 mL Intravenous Q12H   Continuous Infusions:    [name]  [facility] Pager [phone]. If 8PM-8AM, please contact night-coverage at [email or web address], password [facility] 05/19/2013, 11:01 AM  LOS: 1 day

## 2013-05-19 NOTE — Progress Notes (Signed)
[facility] Progress Note  Subjective:   Alert today but confused and quite agitated.  Holds her left arm as if there is a deficit, but when asked if she can move it, she says it hurts because the police stuck her.  This is her HD access arm and she did have treatment last night, but she does not remember. She is unable to follow commands. I ask her to smile, I demonstrate, after a couple of tries she opens her mouth wide.  Objective Filed Vitals:   05/19/13 0004 05/19/13 0404 05/19/13 0702 05/19/13 0906  BP: 120/57 206/95 183/74 183/67  Pulse: 88 125 115 130  Temp: 97.4 F (36.3 C) 99.3 F (37.4 C)  99.4 F (37.4 C)  TempSrc: Oral Oral  Oral  Resp: 20 24 16 18   Weight:      SpO2: 100% 99% 99% 99%   Physical Exam General: alert, disoriented, NAD Heart: Tachy regular, no murmur or rub Lungs: CTA bilat, no wheezes/rhonchi appreciated Abdomen: Soft, NT, + BS Extremities: No LE edema, Rt foot with ischemic skin changes Dialysis Access: L AVG + bruit  Dialysis Orders: Center: [facility] on TTS .  F 160 EDW 59.5 kg HD Bath 2K/2Ca Time 3:45 Heparin 6000. Access L AVG BFR 350 DFR A1.5  Hectorol 2 mcg IV/HD Epogen 1200 Units IV/HD Venofer 0  Recent labs: Hgb 11.1, Tsat 59%, Ferr 1192, P 6.8, Last PTH 331  Assessment/Plan: 1. Encephalopathy - Per primary. Presumed uremic, but difficult to evaluate due to sedation from outside hospital.  She is alert and s/p HD, but still very confused. ?Head CT 2. ESRD - TTS,  K+ 4.2  BUN 70/Cr 11.5 pre tx yesterday, repeat labs still pending. Next HD Tuesday 3. Hypertension/volume - SBPs 180s-200s with tachycardia.  On home Coreg 25 BID, Norvasc 5 and PRN hydralazine. Net UF ~1.3L last evening.  4. Anemia - Hgb 11.3 consistent with op labs. Last Tsat 59%. Hold ESA's for now and follow CBC 5. Metabolic bone disease - Ca 9.6 (10.4 corrected). Phos poorly controlled op on Phoslo 2 ac (question compliance). Last PTH 331. Hold phoslo 2 ac and hectorol  2 for hypercalcemia. Continue low Ca bath. May need non Ca binder at d/c. Follow labs 6. Nutrition - Alb 3.0. NPO given #1 above. Renal diet when appropriate. Multivitamin 7. Rt foot ischemic changes - Rt foot pain per family and op records. Pt says x 1 week but suspect longer.  Missed [facility] appt on 2/17 for ABIs. Have ordered them inpatient. Per Dr Oneida Alar, they will most likely be done tomorrow. He will look for the results 8. DM - insulin per primary  Collene Leyden. Rhodia Albright [facility] Pager [phone] 05/19/2013,9:20 AM  LOS: 1 day    Additional Objective Labs: Basic Metabolic Panel:  Recent Labs Lab 05/18/13 1718  NA 142  K 4.8  CL 99  CO2 21  GLUCOSE 183*  BUN 75*  CREATININE 11.43*  CALCIUM 9.6  PHOS 6.1*   Liver Function Tests:  Recent Labs Lab 05/18/13 1718  AST 8  ALT 8  ALKPHOS 129*  BILITOT 0.2*  PROT 7.7  ALBUMIN 3.0*   CBC:  Recent Labs Lab 05/18/13 1718  WBC 6.9  NEUTROABS 4.4  HGB 11.3*  HCT 32.6*  MCV 89.8  PLT 263   CBG:  Recent Labs Lab 05/18/13 1716 05/19/13 0012 05/19/13 0409 05/19/13 0748  GLUCAP 179* 136* 165* 111*   Studies/Results: No results found. Medications:   .  amLODipine  5 mg Oral Daily  . aspirin EC  81 mg Oral Daily  . calcitRIOL  0.25 mcg Oral Daily  . carvedilol  25 mg Oral BID WC  . heparin  5,000 Units Subcutaneous 3 times per day  . hydrALAZINE  50 mg Oral TID  . insulin aspart  0-9 Units Subcutaneous 6 times per day  . insulin glargine  10 Units Subcutaneous QHS  . sodium chloride  3 mL Intravenous Q12H  . sodium chloride  3 mL Intravenous Q12H   I have seen and examined this patient and agree with plan as outlined by Sonnie Alamo, PA-C.  Cont with HD qTTS while she remains an inpt. Marriah Sanderlin A,MD 05/19/2013 12:14 PM

## 2013-05-19 NOTE — Consult Note (Signed)
[facility] [place] Consult note  Requesting: Renal Service Reason for consult: ischemia right foot History of Present Illness:  Patient is a 63 y.o. year old female who presents for evaluation of right first toe pain.  She was scheduled to see [place] as outpatient last week but missed appt due to snow.  She was admitted with altered mental status after missing several days of dialysis.  She was oriented somewhat today but still confused.  She complains of in her right first toe that has been present several weeks.  She denies claudication but does not ambulate much.  No open wounds.  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  All of these are currently stable  Past Medical History  Diagnosis Date  . Diabetes mellitus   . Hypertension   . CHF (congestive heart failure)   . Hyperlipidemia   . Chronic kidney disease   . Thyroid disease   . Depression   . Shortness of breath     at night  . Arthritis   . Dementia   . Stroke     2013, weakness- all over  . Constipation   . Vision disturbance     Past Surgical History  Procedure Laterality Date  . Arteriovenous graft placement      left upper extremity  . Tubal ligation      x2  . Av fistula placement Left 07/02/2012    Procedure: ARTERIOVENOUS (AV) FISTULA CREATION;  Surgeon: Conrad Stanton, MD;  Location: [facility];  Service: Vascular;  Laterality: Left;  . Av fistula placement Left 10/01/2012    Procedure: INSERTION OF ARTERIOVENOUS (AV) GORE-TEX GRAFT LEFT UPPER ARM;  Surgeon: Conrad Gilmore, MD;  Location: [facility];  Service: Vascular;  Laterality: Left;  Ultrasound guided    Social History History  Substance Use Topics  . Smoking status: Never Smoker   . Smokeless tobacco: Never Used  . Alcohol Use: No    Family History Family History  Problem Relation Age of Onset  . Diabetes Father   . Stroke Father   . Heart disease Mother      Allergies  No Known Allergies   Current Facility-Administered Medications  Medication Dose Route Frequency Provider Last Rate Last Dose  . 0.9 %  sodium chloride infusion  250 mL Intravenous PRN Charlynne Cousins, MD      . acetaminophen (TYLENOL) tablet 650 mg  650 mg Oral Q6H PRN Charlynne Cousins, MD       Or  . acetaminophen (TYLENOL) suppository 650 mg  650 mg Rectal Q6H PRN Charlynne Cousins, MD      . Derrill Memo ON 05/20/2013] amLODipine (NORVASC) tablet 10 mg  10 mg Oral Daily Charlynne Cousins, MD      . aspirin EC tablet 81 mg  81 mg Oral Daily Charlynne Cousins, MD      . calcitRIOL (ROCALTROL) capsule 0.25 mcg  0.25 mcg Oral Daily Charlynne Cousins, MD      . carvedilol (COREG) tablet 25 mg  25 mg Oral BID WC Alvia Grove, PA-C      . furosemide (LASIX) tablet 80 mg  80 mg Oral Daily Charlynne Cousins, MD      . haloperidol (HALDOL) tablet 2 mg  2 mg Oral Once Charlynne Cousins, MD      . haloperidol lactate (HALDOL) injection 2 mg  2 mg Intravenous Q6H  PRN Marinda ElkAbraham Feliz Ortiz, MD   2 mg at 05/19/13 1230  . heparin injection 5,000 Units  5,000 Units Subcutaneous 3 times per day Marinda ElkAbraham Feliz Ortiz, MD   5,000 Units at 05/19/13 1510  . hydrALAZINE (APRESOLINE) injection 5 mg  5 mg Intravenous Q6H PRN Marinda ElkAbraham Feliz Ortiz, MD   5 mg at 05/19/13 [id number]  . hydrALAZINE (APRESOLINE) tablet 50 mg  50 mg Oral TID Marinda ElkAbraham Feliz Ortiz, MD      . insulin aspart (novoLOG) injection 0-9 Units  0-9 Units Subcutaneous 6 times per day Marinda ElkAbraham Feliz Ortiz, MD   2 Units at 05/19/13 0435  . insulin glargine (LANTUS) injection 10 Units  10 Units Subcutaneous QHS Marinda ElkAbraham Feliz Ortiz, MD   10 Units at 05/19/13 0025  . ondansetron (ZOFRAN) tablet 4 mg  4 mg Oral Q6H PRN Marinda ElkAbraham Feliz Ortiz, MD       Or  . ondansetron [facility](ZOFRAN) injection 4 mg  4 mg Intravenous Q6H PRN Marinda ElkAbraham Feliz Ortiz, MD      . polyethylene glycol [facility](MIRALAX / Ethelene HalGLYCOLAX) packet 17 g  17 g Oral Daily PRN Marinda ElkAbraham Feliz  Ortiz, MD      . quinapril (ACCUPRIL) tablet 20 mg  20 mg Oral Daily Marinda ElkAbraham Feliz Ortiz, MD      . sodium chloride 0.9 % injection 3 mL  3 mL Intravenous Q12H Marinda ElkAbraham Feliz Ortiz, MD      . sodium chloride 0.9 % injection 3 mL  3 mL Intravenous Q12H Marinda ElkAbraham Feliz Ortiz, MD   3 mL at 05/19/13 0028  . sodium chloride 0.9 % injection 3 mL  3 mL Intravenous PRN Marinda ElkAbraham Feliz Ortiz, MD        ROS:   Unable to obtain due to poor mental status  Physical Examination  Filed Vitals:   05/19/13 0404 05/19/13 0702 05/19/13 0906 05/19/13 1300  BP: 206/95 183/74 183/67 160/96  Pulse: 125 115 130 132  Temp: 99.3 F (37.4 C)  99.4 F (37.4 C)   TempSrc: Oral  Oral Oral  Resp: 24 16 18 17   Weight:      SpO2: 99% 99% 99% 98%    Body mass index is 24.15 kg/(m^2).  General:  Alert and oriented person and place not time or situation, no acute distress HEENT: Normal Neck: No bruit or JVD Pulmonary: Clear to auscultation bilaterally Cardiac: Regular Rate and Rhythm Abdomen: Soft, non-tender, non-distended, no mass Skin: No rash Extremity Pulses:  2+ radial, brachial, femoral, absent dorsalis pedis, posterior tibial pulses bilaterally Musculoskeletal: No deformity or edema,  Right first toe slight erythema no ulcer or gangrene  Neurologic: Upper and lower extremity motor 5/5 and symmetric  DATA:  ABI pending  . CBC    Component Value Date/Time   WBC 13.0* 05/19/2013 0822   RBC 4.15 05/19/2013 0822   HGB 12.9 05/19/2013 0822   HCT 37.1 05/19/2013 0822   PLT 285 05/19/2013 0822   MCV 89.4 05/19/2013 0822   MCH 31.1 05/19/2013 0822   MCHC 34.8 05/19/2013 0822   RDW 14.2 05/19/2013 0822   LYMPHSABS 2.0 05/18/2013 1718   MONOABS 0.4 05/18/2013 1718   EOSABS 0.1 05/18/2013 1718   BASOSABS 0.0 05/18/2013 1718    BMET    Component Value Date/Time   NA 142 05/18/2013 1718   K 4.8 05/18/2013 1718   CL 99 05/18/2013 1718   CO2 21 05/18/2013 1718   GLUCOSE 183* 05/18/2013 1718   BUN 75* 05/18/2013 1718  CREATININE 11.43* 05/18/2013 1718   CALCIUM 9.6 05/18/2013 1718   GFRNONAA 3* 05/18/2013 1718   GFRAA 4* 05/18/2013 1718   ASSESSMENT:  Clincal evidence of PAD by pulse exam but not able to really get clear history of her toe symptoms at this point.  Hopefully mental status will continue to clear.  Will reassess after ABI obtained.   Ruta Hinds, MD [facility] [place] Office: [phone] Pager: [phone]

## 2013-05-20 ENCOUNTER — Encounter: Payer: Self-pay | Admitting: Vascular Surgery

## 2013-05-20 ENCOUNTER — Encounter (HOSPITAL_COMMUNITY): Payer: Self-pay | Admitting: *Deleted

## 2013-05-20 DIAGNOSIS — M25579 Pain in unspecified ankle and joints of unspecified foot: Secondary | ICD-10-CM

## 2013-05-20 DIAGNOSIS — R0989 Other specified symptoms and signs involving the circulatory and respiratory systems: Secondary | ICD-10-CM

## 2013-05-20 DIAGNOSIS — I70219 Atherosclerosis of native arteries of extremities with intermittent claudication, unspecified extremity: Secondary | ICD-10-CM

## 2013-05-20 LAB — GLUCOSE, CAPILLARY
GLUCOSE-CAPILLARY: 145 mg/dL — AB (ref 70–99)
Glucose-Capillary: 107 mg/dL — ABNORMAL HIGH (ref 70–99)
Glucose-Capillary: 111 mg/dL — ABNORMAL HIGH (ref 70–99)
Glucose-Capillary: 125 mg/dL — ABNORMAL HIGH (ref 70–99)
Glucose-Capillary: 133 mg/dL — ABNORMAL HIGH (ref 70–99)
Glucose-Capillary: 180 mg/dL — ABNORMAL HIGH (ref 70–99)

## 2013-05-20 MED ORDER — SODIUM CHLORIDE 0.9 % IV SOLN
1250.0000 mg | Freq: Once | INTRAVENOUS | Status: AC
  Administered 2013-05-20: 1250 mg via INTRAVENOUS
  Filled 2013-05-20 (×2): qty 1250

## 2013-05-20 MED ORDER — HYDRALAZINE HCL 50 MG PO TABS
100.0000 mg | ORAL_TABLET | Freq: Three times a day (TID) | ORAL | Status: DC
  Administered 2013-05-20 – 2013-05-22 (×3): 100 mg via ORAL
  Filled 2013-05-20 (×8): qty 2

## 2013-05-20 MED ORDER — VANCOMYCIN HCL 500 MG IV SOLR
500.0000 mg | INTRAVENOUS | Status: DC
  Administered 2013-05-21 – 2013-05-25 (×3): 500 mg via INTRAVENOUS
  Filled 2013-05-20 (×5): qty 500

## 2013-05-20 MED ORDER — PIPERACILLIN-TAZOBACTAM IN DEX 2-0.25 GM/50ML IV SOLN
2.2500 g | Freq: Three times a day (TID) | INTRAVENOUS | Status: DC
  Administered 2013-05-20 – 2013-05-27 (×21): 2.25 g via INTRAVENOUS
  Filled 2013-05-20 (×24): qty 50

## 2013-05-20 NOTE — Clinical Documentation Improvement (Signed)
Congestive heart failure, unspecified, Please specify TYPE & ACUITY  Possible Clinical Conditions?  Chronic Systolic Congestive Heart Failure Chronic Diastolic Congestive Heart Failure Chronic Systolic & Diastolic Congestive Heart Failure Acute Systolic Congestive Heart Failure Acute Diastolic Congestive Heart Failure Acute Systolic & Diastolic Congestive Heart Failure Acute on Chronic Systolic Congestive Heart Failure Acute on Chronic Diastolic Congestive Heart Failure Acute on Chronic Systolic & Diastolic Congestive Heart Failure Other Condition Cannot Clinically Determine  Pt has history of CHF  Thank You, Alessandra Grout, RN, BSN, CCDS, Clinical Documentation Specialist:  [phone]   [phone] [place]- Health Information Management

## 2013-05-20 NOTE — Progress Notes (Signed)
ANTIBIOTIC CONSULT NOTE - INITIAL  Pharmacy Consult for Vancomycin Indication: R/O bacteremia, PNA  No Known Allergies  Patient Measurements: Weight: 129 lb 6.6 oz (58.7 kg) Adjusted Body Weight:   Vital Signs: Temp: 98.8 F (37.1 C) (02/23 1400) Temp src: Oral (02/23 1400) BP: 165/70 mmHg (02/23 1400) Pulse Rate: 88 (02/23 1400) Intake/Output from previous day: 02/22 0701 - 02/23 0700 In: 150 [P.O.:150] Out: -  Intake/Output from this shift: Total I/O In: 180 [P.O.:180] Out: -   Labs:  Recent Labs  05/18/13 1718 05/19/13 0822  WBC 6.9 13.0*  HGB 11.3* 12.9  PLT 263 285  CREATININE 11.43*  --    The CrCl is unknown because both a height and weight (above a minimum accepted value) are required for this calculation. No results found for this basename: VANCOTROUGH, Corlis Leak, VANCORANDOM, [place], GENTPEAK, GENTRANDOM, TOBRATROUGH, TOBRAPEAK, TOBRARND, AMIKACINPEAK, AMIKACINTROU, AMIKACIN,  in the last 72 hours   Microbiology: Recent Results (from the past 720 hour(s))  MRSA PCR SCREENING     Status: None   Collection Time    05/19/13  4:54 AM      Result Value Ref Range Status   MRSA by PCR NEGATIVE  NEGATIVE Final   Comment:            The GeneXpert MRSA Assay (FDA     approved for NASAL specimens     only), is one component of a     comprehensive MRSA colonization     surveillance program. It is not     intended to diagnose MRSA     infection nor to guide or     monitor treatment for     MRSA infections.  CULTURE, BLOOD (ROUTINE X 2)     Status: None   Collection Time    05/19/13  2:05 PM      Result Value Ref Range Status   Specimen Description BLOOD RIGHT ANTECUBITAL   Final   Special Requests BOTTLES DRAWN AEROBIC ONLY 10CC   Final   Culture  Setup Time     Final   Value: 05/19/2013 18:50     Performed at [facility]   Culture     Final   Value: GRAM POSITIVE COCCI IN CHAINS     Note: Gram Stain Report Called to,Read Back By and  Verified With: ANDY B @ [phone] 05/20/13 BY KRAWS     Performed at [facility]   Report Status PENDING   Incomplete  CULTURE, BLOOD (ROUTINE X 2)     Status: None   Collection Time    05/19/13  2:15 PM      Result Value Ref Range Status   Specimen Description BLOOD RIGHT HAND   Final   Special Requests BOTTLES DRAWN AEROBIC ONLY 10CC   Final   Culture  Setup Time     Final   Value: 05/19/2013 18:50     Performed at [facility]   Culture     Final   Value:        BLOOD CULTURE RECEIVED NO GROWTH TO DATE CULTURE WILL BE HELD FOR 5 DAYS BEFORE ISSUING A FINAL NEGATIVE REPORT     Performed at [facility]   Report Status PENDING   Incomplete    Medical History: Past Medical History  Diagnosis Date  . Diabetes mellitus   . Hypertension   . CHF (congestive heart failure)   . Hyperlipidemia   . End-stage renal disease on hemodialysis  Started dialysis in late 2014. Gets HD in [place] on TTS schedule.     . Thyroid disease   . Depression   . Shortness of breath     at night  . Arthritis   . Dementia   . Stroke     2013, weakness- all over  . Constipation   . Vision disturbance   . End stage renal disease 03/30/2011    Medications:  Prescriptions prior to admission  Medication Sig Dispense Refill  . amLODipine (NORVASC) 5 MG tablet Take 5 mg by mouth daily.        Marland Kitchen aspirin EC 81 MG tablet Take 81 mg by mouth daily.      . calcitRIOL (ROCALTROL) 0.25 MCG capsule Take 0.25 mcg by mouth daily.        . carvedilol (COREG) 25 MG tablet Take 25 mg by mouth 2 (two) times daily with a meal.        . furosemide (LASIX) 80 MG tablet Take 80 mg by mouth daily.        Marland Kitchen gabapentin (NEURONTIN) 600 MG tablet Take 600 mg by mouth 3 (three) times daily.        . hydrALAZINE (APRESOLINE) 50 MG tablet Take 50 mg by mouth 3 (three) times daily.      . Insulin Glargine (LANTUS [place]) Inject 20 Units into the skin at bedtime.        Marland Kitchen oxyCODONE (ROXICODONE) 5 MG immediate  release tablet Take 1-2 tablets (5-10 mg total) by mouth every 4 (four) hours as needed for pain.  30 tablet  0  . quinapril (ACCUPRIL) 20 MG tablet Take 20 mg by mouth daily.       Assessment: [name] to start Vancomycin and Zosyn for r/o bacteremia and PNA. Patient is currently afebrile but WBC elevated 13. Patient has ESRD and receives HD on TTS - next session scheduled for 2/24. - Weight: 58.7 kg - Blood cultures: 1/2 GPC  Goal of Therapy:  Pre-HD Vancomycin level: 15-25 mcg/ml  Plan:  1. Vancomycin 1.25g IV x 1, then 500mg  qHD (TTS) 2. Continue Zosyn 2.25g IV q8h - appropriate for HD 3. Monitor renal function, cultures, clinical course and adjust as indicated  Earleen Newport [phone] 05/20/2013,6:26 PM

## 2013-05-20 NOTE — Consult Note (Addendum)
Pt still lethargic and not able to communicate very well this afternoon.    Filed Vitals:   05/19/13 1300 05/19/13 2129 05/20/13 0500 05/20/13 1000  BP: 160/96 111/53 176/61 154/75  Pulse: 132 87 94 90  Temp:  100.1 F (37.8 C) 98.9 F (37.2 C) 98.9 F (37.2 C)  TempSrc: Oral Oral Oral Oral  Resp: 17 18 16 18   Weight:  129 lb 6.6 oz (58.7 kg)    SpO2: 98% 96% 96% 98%    Right first toe no ulcer   ABI reviewed will eventually need arteriogram for further evaluation but needs to be more recovered from this event.  Will schedule for outpt visit in 2 weeks to reassess her recovery.  Ruta Hinds, MD [facility] [place] Office: [phone] Pager: [phone]

## 2013-05-20 NOTE — Progress Notes (Addendum)
[facility] PROGRESS NOTE Interim History: 63 y.o. female Past medical history of end-stage renal disease dialyzes Tuesday Thursdays and Saturdays, diabetes hypertension and coronary artery disease that was brought to the hospital due to confusion. As per granddaughter as patient cannot give a history she has not had dialysis for over a week as she couldn't get transportation. She was found the day prior to admission confused but when the granddaughter went to the house today she incoherent and not making any sense they took her to [facility]. As per granddaughter the patient had been complaining of nausea and vomiting 3 days prior to admission.   Assessment/Plan: Metabolic encephalopathy/ Uremic encephalopathy: - Resolved, no infectious etiology, has remained afebrile, CXR ATX. - Haldol prn. As per granddaughter she lived alone and perform her home duties. - most likely Uremic.   Type II or unspecified type diabetes mellitus without mention of complication, not stated as uncontrolled - BG improved. - cont Lantus Plus SSI.  Diabetic nephropathy/Chronic kidney disease, stage IV (severe)/  End stage renal disease - S/p HD. - per renal.  Malignant HTN: - Restart home medications. Tachycardia and HTN most likely due to psycosis, now resolved. - Increase hydralazine.  Right foot ischemic changes: - Consulted vascular, ABI (0.37) showed severe PVD on the right.  Code Status: full  Family Communication: granddaughter  Disposition Plan: inpatient    Consultants:  renal  Procedures:  HD  CT head  Antibiotics:  none  HPI/Subjective: Feels better will like to avoid amputation. Objective: Filed Vitals:   05/19/13 0906 05/19/13 1300 05/19/13 2129 05/20/13 0500  BP: 183/67 160/96 111/53 176/61  Pulse: 130 132 87 94  Temp: 99.4 F (37.4 C)  100.1 F (37.8 C) 98.9 F (37.2 C)  TempSrc: Oral Oral Oral Oral  Resp: 18 17 18 16   Weight:   58.7 kg (129 lb 6.6  oz)   SpO2: 99% 98% 96% 96%    Intake/Output Summary (Last 24 hours) at 05/20/13 1020 Last data filed at 05/19/13 2145  Gross per 24 hour  Intake    150 ml  Output      0 ml  Net    150 ml   Filed Weights   05/18/13 1938 05/18/13 2341 05/19/13 2129  Weight: 61.2 kg (134 lb 14.7 oz) 59.9 kg (132 lb 0.9 oz) 58.7 kg (129 lb 6.6 oz)    Exam:  General: Refused, A&O x3   Data Reviewed: Basic Metabolic Panel:  Recent Labs Lab 05/18/13 1718  NA 142  K 4.8  CL 99  CO2 21  GLUCOSE 183*  BUN 75*  CREATININE 11.43*  CALCIUM 9.6  MG 2.8*  PHOS 6.1*   Liver Function Tests:  Recent Labs Lab 05/18/13 1718  AST 8  ALT 8  ALKPHOS 129*  BILITOT 0.2*  PROT 7.7  ALBUMIN 3.0*   No results found for this basename: LIPASE, AMYLASE,  in the last 168 hours No results found for this basename: AMMONIA,  in the last 168 hours CBC:  Recent Labs Lab 05/18/13 1718 05/19/13 0822  WBC 6.9 13.0*  NEUTROABS 4.4  --   HGB 11.3* 12.9  HCT 32.6* 37.1  MCV 89.8 89.4  PLT 263 285   Cardiac Enzymes: No results found for this basename: CKTOTAL, CKMB, CKMBINDEX, TROPONINI,  in the last 168 hours BNP (last 3 results) No results found for this basename: PROBNP,  in the last 8760 hours CBG:  Recent Labs Lab 05/19/13 1634 05/19/13 2033 05/20/13  0009 05/20/13 0404 05/20/13 0739  GLUCAP 186* 203* 133* 107* 111*    Recent Results (from the past 240 hour(s))  MRSA PCR SCREENING     Status: None   Collection Time    05/19/13  4:54 AM      Result Value Ref Range Status   MRSA by PCR NEGATIVE  NEGATIVE Final   Comment:            The GeneXpert MRSA Assay (FDA     approved for NASAL specimens     only), is one component of a     comprehensive MRSA colonization     surveillance program. It is not     intended to diagnose MRSA     infection nor to guide or     monitor treatment for     MRSA infections.  CULTURE, BLOOD (ROUTINE X 2)     Status: None   Collection Time     05/19/13  2:05 PM      Result Value Ref Range Status   Specimen Description BLOOD RIGHT ANTECUBITAL   Final   Special Requests BOTTLES DRAWN AEROBIC ONLY 10CC   Final   Culture  Setup Time     Final   Value: 05/19/2013 18:50     Performed at [facility]   Culture     Final   Value: GRAM POSITIVE COCCI IN CHAINS     Note: Gram Stain Report Called to,Read Back By and Verified With: ANDY B @ [phone] 05/20/13 BY KRAWS     Performed at [facility]   Report Status PENDING   Incomplete  CULTURE, BLOOD (ROUTINE X 2)     Status: None   Collection Time    05/19/13  2:15 PM      Result Value Ref Range Status   Specimen Description BLOOD RIGHT HAND   Final   Special Requests BOTTLES DRAWN AEROBIC ONLY 10CC   Final   Culture  Setup Time     Final   Value: 05/19/2013 18:50     Performed at [facility]   Culture     Final   Value:        BLOOD CULTURE RECEIVED NO GROWTH TO DATE CULTURE WILL BE HELD FOR 5 DAYS BEFORE ISSUING A FINAL NEGATIVE REPORT     Performed at [facility]   Report Status PENDING   Incomplete     Studies: Dg Chest 1 View  05/19/2013   CLINICAL DATA:  Confusion.  EXAM: CHEST - 1 VIEW  COMPARISON:  05/18/2013 and 08/06/2012  FINDINGS: There is a small patchy area of density in the left midzone. The lungs are otherwise clear. Heart size and vascularity are normal. No osseous abnormality.  IMPRESSION: Small new area of atelectasis/ infiltrate in the left midzone.   Electronically Signed   By: Rozetta Nunnery M.D.   On: 05/19/2013 20:41   Ct Head Wo Contrast  05/19/2013   CLINICAL DATA:  Confusion.  Dialysis patient  EXAM: CT HEAD WITHOUT CONTRAST  TECHNIQUE: Contiguous axial images were obtained from the base of the skull through the vertex without intravenous contrast.  COMPARISON:  CT 05/18/2013  FINDINGS: Generalized atrophy. Moderate to advanced chronic microvascular ischemic changes in the white matter. Chronic ischemia in the thalamus bilaterally.  These findings are stable.  Negative for acute infarct.  Negative for acute hemorrhage or mass.  Mucosal edema in the paranasal sinuses. Air-fluid level left maxillary sinus. No acute  skull abnormality. Atherosclerotic disease.  IMPRESSION: Atrophy and chronic microvascular ischemia. No acute intracranial abnormality.  Sinusitis with air-fluid level.   Electronically Signed   By: Franchot Gallo M.D.   On: 05/19/2013 12:53    Scheduled Meds: . amLODipine  10 mg Oral Daily  . aspirin EC  81 mg Oral Daily  . calcitRIOL  0.25 mcg Oral Daily  . carvedilol  25 mg Oral BID WC  . furosemide  80 mg Oral Daily  . haloperidol  2 mg Oral Once  . heparin  5,000 Units Subcutaneous 3 times per day  . hydrALAZINE  50 mg Oral TID  . insulin aspart  0-9 Units Subcutaneous 6 times per day  . insulin glargine  10 Units Subcutaneous QHS  . quinapril  20 mg Oral Daily  . sodium chloride  3 mL Intravenous Q12H  . sodium chloride  3 mL Intravenous Q12H   Continuous Infusions:    Charlynne Cousins  [facility] Pager [phone]. If 8PM-8AM, please contact night-coverage at [email or web address], password [facility] 05/20/2013, 10:20 AM  LOS: 2 days

## 2013-05-20 NOTE — Progress Notes (Signed)
VASCULAR LAB PRELIMINARY  ARTERIAL  ABI completed:    RIGHT    LEFT    PRESSURE WAVEFORM  PRESSURE WAVEFORM  BRACHIAL 168 Tri BRACHIAL NA- restricted arm   DP Too dampened to obtain pressure Damp mono DP 99 Mono   AT   AT    PT 62 Damp mono  PT 94 Mono   PER 50 Damp mono PER    GREAT TOE  NA GREAT TOE  NA    RIGHT LEFT  ABI 0.37- severe arterial disease 0.59- moderate arterial disease     Landry Mellow, RDMS, RVT  05/20/2013, 8:24 AM

## 2013-05-20 NOTE — Progress Notes (Addendum)
  [facility] Progress Note   Subjective: Still lethargic, follows simple commands. Coughing up "brown thick stuff" per husband  Filed Vitals:   05/19/13 1300 05/19/13 2129 05/20/13 0500 05/20/13 1000  BP: 160/96 111/53 176/61 154/75  Pulse: 132 87 94 90  Temp:  100.1 F (37.8 C) 98.9 F (37.2 C) 98.9 F (37.2 C)  TempSrc: Oral Oral Oral Oral  Resp: 17 18 16 18   Weight:  58.7 kg (129 lb 6.6 oz)    SpO2: 98% 96% 96% 98%  Exam Somnolent, no distress, awakens to voice and falls back asleep No jvd, neck supple Chest clear bilat RRR no MRG Abd soft, nt, nd Ischemic skin changes right foot but no signs of infection, no LE edema Left AVG +bruit no signs of infection or drainage Neuro is nf, "1999" , "[place]"  Dialysis: TTS [place] 3h 18min   59.5kg   2K/2.0 [place]   Heparin 6000   L arm AVG  Hectorol 2     Epo 1200     Venofer none Recent lab: Hb 11.1 tsat 59% ferritin 1192 phos 6.8  pth 331  Home meds: norvasc, rocaltrol, coreg, lasix, hydralazine, Lantus, oxycodone, accupril, neurontin 600 tid  Assessment/Plan: 1 Encephalopathy- still confused, maybe a little better per family; missed two HD last week, had inpt HD on Sat, possible uremia still, was on too much neurontin at home which will cause these symptoms, but this should be out of her system by now. Could be PNA/sepsis also, +sputum, +infiltrate on CXR, 1 of 2 blood cx's is positive so far, +fever/wbc up. Plan HD in am first shift. Will start vanc and zosyn IV 2 ESRD on HD 3 HTN/volume- on 3 BP meds, below dry wt, BP on high side 4 2HPT- holding phoslo/vit D for slightly high Ca, may need nonCa binder 5 Anemia- holding esa for now 6 DM on insulin    Kelly Splinter MD  pager [phone]    cell [phone]  05/20/2013, 4:03 PM     Recent Labs Lab 05/18/13 1718  NA 142  K 4.8  CL 99  CO2 21  GLUCOSE 183*  BUN 75*  CREATININE 11.43*  CALCIUM 9.6  PHOS 6.1*    Recent Labs Lab 05/18/13 1718   AST 8  ALT 8  ALKPHOS 129*  BILITOT 0.2*  PROT 7.7  ALBUMIN 3.0*    Recent Labs Lab 05/18/13 1718 05/19/13 0822  WBC 6.9 13.0*  NEUTROABS 4.4  --   HGB 11.3* 12.9  HCT 32.6* 37.1  MCV 89.8 89.4  PLT 263 285   . amLODipine  10 mg Oral Daily  . aspirin EC  81 mg Oral Daily  . calcitRIOL  0.25 mcg Oral Daily  . carvedilol  25 mg Oral BID WC  . furosemide  80 mg Oral Daily  . haloperidol  2 mg Oral Once  . heparin  5,000 Units Subcutaneous 3 times per day  . hydrALAZINE  100 mg Oral TID  . insulin aspart  0-9 Units Subcutaneous 6 times per day  . insulin glargine  10 Units Subcutaneous QHS  . quinapril  20 mg Oral Daily  . sodium chloride  3 mL Intravenous Q12H  . sodium chloride  3 mL Intravenous Q12H     sodium chloride, acetaminophen, acetaminophen, haloperidol lactate, hydrALAZINE, ondansetron (ZOFRAN) IV, ondansetron, polyethylene glycol, sodium chloride

## 2013-05-20 NOTE — Progress Notes (Signed)
INITIAL NUTRITION ASSESSMENT  DOCUMENTATION CODES Per approved criteria  -Severe malnutrition in the context of chronic illness   INTERVENTION: Diet advancement per team's discretion. Recommend Nepro Shake po BID, each supplement provides 425 kcal and 19 grams protein, once diet order permits. RD to continue to follow nutrition care plan.  NUTRITION DIAGNOSIS: Inadequate oral intake related to AMS as evidenced by NPO status.   Goal: Diet advancement. Intake to meet >90% of estimated nutrition needs.  Monitor:  weight trends, lab trends, I/O's, further diet advancement  Reason for Assessment: Malnutrition Screening Tool  63 y.o. female  Admitting Dx: Metabolic encephalopathy  ASSESSMENT: PMHx significant for ESRD (TTS), DM, HTN, CAD. Admitted s/p missing HD x 1 week 2/2 lack of transportation. 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  Vascular evaluated pt's food, found to severe severe PVD on R foot.  Pt is NPO 2/2 encephalopathy. Unable to answer any of my questions, hx provided by boyfriend at bedside. He states that pt has a good appetite at baseline. Eats 3 meals a day without any diet restrictions. He confirms that pt has not had anything to eat x 2 days 2/2 hospitalization.  Pt with 12% wt loss x 6 months. Potassium WNL. Phosphorus is elevated at 6.8.  Nutrition Focused Physical Exam:  Subcutaneous Fat:  Orbital Region: WNL Upper Arm Region: n/a Thoracic and Lumbar Region: n/a  Muscle:  Temple Region: moderate depletion Clavicle Bone Region: WNL Clavicle and Acromion Bone Region: WNL Scapular Bone Region: n/a Dorsal Hand: severe depletion Patellar Region: severe depletion Anterior Thigh Region: severe depletion Posterior Calf Region: severe depletion  Edema: n/a  Pt meets criteria for severe MALNUTRITION in the context of chronic illness as evidenced by 12% wt loss x 6 months and severe muscle depletion.   Height: Ht Readings from Last 1 Encounters:   11/02/12 5\' 2"  (1.575 m)    Weight: Wt Readings from Last 1 Encounters:  05/19/13 129 lb 6.6 oz (58.7 kg)    Ideal Body Weight: 110 lb  % Ideal Body Weight: 117%  Wt Readings from Last 10 Encounters:  05/19/13 129 lb 6.6 oz (58.7 kg)  11/02/12 150 lb 8 oz (68.266 kg)  09/14/12 145 lb (65.772 kg)  07/02/12 146 lb 9.7 oz (66.5 kg)  07/02/12 146 lb 9.7 oz (66.5 kg)  06/26/12 150 lb (68.04 kg)  03/30/11 146 lb (66.225 kg)  01/18/11 146 lb 4.8 oz (66.361 kg)    Usual Body Weight: 145 - 150 lb  % Usual Body Weight: 88%  BMI:  Body mass index is 23.66 kg/(m^2). Normal weight  Estimated Nutritional Needs: Kcal: 1500 - 1700 Protein: 70 - 80 g Fluid: 1.2 liters  Skin: intact  Diet Order: NPO  EDUCATION NEEDS: -No education needs identified at this time   Intake/Output Summary (Last 24 hours) at 05/20/13 1422 Last data filed at 05/20/13 1100  Gross per 24 hour  Intake    210 ml  Output      0 ml  Net    210 ml    Last BM: PTA  Labs:   Recent Labs Lab 05/18/13 1718  NA 142  K 4.8  CL 99  CO2 21  BUN 75*  CREATININE 11.43*  CALCIUM 9.6  MG 2.8*  PHOS 6.1*  GLUCOSE 183*    CBG (last 3)   Recent Labs  05/20/13 0404 05/20/13 0739 05/20/13 1119  GLUCAP 107* 111* 125*    Scheduled Meds: . amLODipine  10 mg Oral Daily  .  aspirin EC  81 mg Oral Daily  . calcitRIOL  0.25 mcg Oral Daily  . carvedilol  25 mg Oral BID WC  . furosemide  80 mg Oral Daily  . haloperidol  2 mg Oral Once  . heparin  5,000 Units Subcutaneous 3 times per day  . hydrALAZINE  100 mg Oral TID  . insulin aspart  0-9 Units Subcutaneous 6 times per day  . insulin glargine  10 Units Subcutaneous QHS  . quinapril  20 mg Oral Daily  . sodium chloride  3 mL Intravenous Q12H  . sodium chloride  3 mL Intravenous Q12H    Continuous Infusions:   Past Medical History  Diagnosis Date  . Diabetes mellitus   . Hypertension   . CHF (congestive heart failure)   . Hyperlipidemia    . Chronic kidney disease   . Thyroid disease   . Depression   . Shortness of breath     at night  . Arthritis   . Dementia   . Stroke     2013, weakness- all over  . Constipation   . Vision disturbance     Past Surgical History  Procedure Laterality Date  . Arteriovenous graft placement      left upper extremity  . Tubal ligation      x2  . Av fistula placement Left 07/02/2012    Procedure: ARTERIOVENOUS (AV) FISTULA CREATION;  Surgeon: Conrad Laura, MD;  Location: [place];  Service: Vascular;  Laterality: Left;  . Av fistula placement Left 10/01/2012    Procedure: INSERTION OF ARTERIOVENOUS (AV) GORE-TEX GRAFT LEFT UPPER ARM;  Surgeon: Conrad , MD;  Location: [facility];  Service: Vascular;  Laterality: Left;  Ultrasound guided    Inda Coke MS, RD, LDN Inpatient Registered Dietitian Pager: [phone] After-hours pager: [phone]

## 2013-05-21 ENCOUNTER — Inpatient Hospital Stay (HOSPITAL_COMMUNITY): Payer: Medicare Other

## 2013-05-21 ENCOUNTER — Encounter (HOSPITAL_COMMUNITY): Payer: Medicaid Other

## 2013-05-21 ENCOUNTER — Ambulatory Visit: Payer: Medicaid Other | Admitting: Vascular Surgery

## 2013-05-21 ENCOUNTER — Telehealth: Payer: Self-pay | Admitting: Vascular Surgery

## 2013-05-21 DIAGNOSIS — G9341 Metabolic encephalopathy: Secondary | ICD-10-CM

## 2013-05-21 DIAGNOSIS — E1129 Type 2 diabetes mellitus with other diabetic kidney complication: Secondary | ICD-10-CM

## 2013-05-21 DIAGNOSIS — N184 Chronic kidney disease, stage 4 (severe): Secondary | ICD-10-CM

## 2013-05-21 DIAGNOSIS — N058 Unspecified nephritic syndrome with other morphologic changes: Secondary | ICD-10-CM

## 2013-05-21 DIAGNOSIS — G9340 Encephalopathy, unspecified: Secondary | ICD-10-CM

## 2013-05-21 DIAGNOSIS — N186 End stage renal disease: Principal | ICD-10-CM

## 2013-05-21 DIAGNOSIS — E119 Type 2 diabetes mellitus without complications: Secondary | ICD-10-CM

## 2013-05-21 LAB — GLUCOSE, CAPILLARY
Glucose-Capillary: 116 mg/dL — ABNORMAL HIGH (ref 70–99)
Glucose-Capillary: 129 mg/dL — ABNORMAL HIGH (ref 70–99)
Glucose-Capillary: 238 mg/dL — ABNORMAL HIGH (ref 70–99)
Glucose-Capillary: 243 mg/dL — ABNORMAL HIGH (ref 70–99)
Glucose-Capillary: 91 mg/dL (ref 70–99)

## 2013-05-21 LAB — CBC
HCT: 33.3 % — ABNORMAL LOW (ref 36.0–46.0)
Hemoglobin: 11.6 g/dL — ABNORMAL LOW (ref 12.0–15.0)
MCH: 31 pg (ref 26.0–34.0)
MCHC: 34.8 g/dL (ref 30.0–36.0)
MCV: 89 fL (ref 78.0–100.0)
PLATELETS: 275 10*3/uL (ref 150–400)
RBC: 3.74 MIL/uL — ABNORMAL LOW (ref 3.87–5.11)
RDW: 14.1 % (ref 11.5–15.5)
WBC: 23.2 10*3/uL — ABNORMAL HIGH (ref 4.0–10.5)

## 2013-05-21 LAB — RENAL FUNCTION PANEL
ALBUMIN: 3.2 g/dL — AB (ref 3.5–5.2)
BUN: 76 mg/dL — AB (ref 6–23)
CALCIUM: 9.7 mg/dL (ref 8.4–10.5)
CO2: 21 mEq/L (ref 19–32)
CREATININE: 11.51 mg/dL — AB (ref 0.50–1.10)
Chloride: 93 mEq/L — ABNORMAL LOW (ref 96–112)
GFR calc Af Amer: 4 mL/min — ABNORMAL LOW (ref >90)
GFR calc non Af Amer: 3 mL/min — ABNORMAL LOW (ref >90)
Glucose, Bld: 108 mg/dL — ABNORMAL HIGH (ref 70–99)
Phosphorus: 7.4 mg/dL — ABNORMAL HIGH (ref 2.3–4.6)
Potassium: 4 mEq/L (ref 3.7–5.3)
Sodium: 140 mEq/L (ref 137–147)

## 2013-05-21 MED ORDER — SODIUM CHLORIDE 0.9 % IV SOLN: 100.0000 mL | INTRAVENOUS | Status: DC | PRN

## 2013-05-21 MED ORDER — ALTEPLASE 2 MG IJ SOLR: 2.0000 mg | Freq: Once | INTRAMUSCULAR | Status: AC | PRN

## 2013-05-21 MED ORDER — PENTAFLUOROPROP-TETRAFLUOROETH EX AERO: 1.0000 "application " | INHALATION_SPRAY | CUTANEOUS | Status: DC | PRN

## 2013-05-21 MED ORDER — LIDOCAINE-PRILOCAINE 2.5-2.5 % EX CREA: 1.0000 "application " | TOPICAL_CREAM | CUTANEOUS | Status: DC | PRN

## 2013-05-21 MED ORDER — NEPRO/CARBSTEADY PO LIQD: 237.0000 mL | ORAL | Status: DC | PRN

## 2013-05-21 MED ORDER — HEPARIN SODIUM (PORCINE) 1000 UNIT/ML DIALYSIS: 1000.0000 [IU] | INTRAMUSCULAR | Status: DC | PRN

## 2013-05-21 MED ORDER — LIDOCAINE HCL (PF) 1 % IJ SOLN: 5.0000 mL | INTRAMUSCULAR | Status: DC | PRN

## 2013-05-21 MED ORDER — HEPARIN SODIUM (PORCINE) 1000 UNIT/ML DIALYSIS
6000.0000 [IU] | Freq: Once | INTRAMUSCULAR | Status: AC
  Administered 2013-05-23: 6000 [IU] via INTRAVENOUS_CENTRAL

## 2013-05-21 MED ORDER — WHITE PETROLATUM GEL
Status: AC
  Administered 2013-05-21: 0.2
  Filled 2013-05-21: qty 5

## 2013-05-21 NOTE — Progress Notes (Signed)
[facility] PROGRESS NOTE Interim History: 63 y.o. female Past medical history of end-stage renal disease dialyzes Tuesday Thursdays and Saturdays, diabetes hypertension and coronary artery disease that was brought to the hospital due to confusion. As per granddaughter as patient cannot give a history she has not had dialysis for over a week as she couldn't get transportation. She was found the day prior to admission confused but when the granddaughter went to the house today she incoherent and not making any sense they took her to [facility]. As per granddaughter the patient had been complaining of nausea and vomiting 3 days prior to admission.   Assessment/Plan: Metabolic encephalopathy/ Uremic encephalopathy: - Resolved,on vanc and zosyn has remained afebrile, CXR ATX. - Haldol prn. As per granddaughter she lived alone and perform her home duties. - most likely Uremic.   Type II or unspecified type diabetes mellitus without mention of complication, not stated as uncontrolled - BG improved. - cont Lantus Plus SSI.  Diabetic nephropathy/Chronic kidney disease, stage IV (severe)/  End stage renal disease - S/p HD. - per renal.  Malignant HTN: - Restart home medications. Tachycardia and HTN most likely due to psycosis, now resolved. - Increase hydralazine.  Right foot ischemic changes: - Consulted vascular, ABI (0.37) showed severe PVD on the right.  Code Status: full  Family Communication: granddaughter  Disposition Plan: inpatient    Consultants:  renal  Procedures:  HD  CT head  Antibiotics:  none  HPI/Subjective: Feels better will like to avoid amputation. Objective: Filed Vitals:   05/21/13 1052 05/21/13 1101 05/21/13 1118 05/21/13 1145  BP: 122/51 102/47 133/46 130/71  Pulse: 101 101 106 103  Temp:   97.7 F (36.5 C) 98.3 F (36.8 C)  TempSrc:   Oral Oral  Resp: 21 18 19 18   Height:      Weight:   58.5 kg (128 lb 15.5 oz)   SpO2:    98% 97%    Intake/Output Summary (Last 24 hours) at 05/21/13 1230 Last data filed at 05/21/13 1118  Gross per 24 hour  Intake    420 ml  Output     20 ml  Net    400 ml   Filed Weights   05/19/13 2129 05/21/13 0656 05/21/13 1118  Weight: 58.7 kg (129 lb 6.6 oz) 58.6 kg (129 lb 3 oz) 58.5 kg (128 lb 15.5 oz)    Exam:  General: Refused, A&O x3   Data Reviewed: Basic Metabolic Panel:  Recent Labs Lab 05/18/13 1718 05/21/13 0701  NA 142 140  K 4.8 4.0  CL 99 93*  CO2 21 21  GLUCOSE 183* 108*  BUN 75* 76*  CREATININE 11.43* 11.51*  CALCIUM 9.6 9.7  MG 2.8*  --   PHOS 6.1* 7.4*   Liver Function Tests:  Recent Labs Lab 05/18/13 1718 05/21/13 0701  AST 8  --   ALT 8  --   ALKPHOS 129*  --   BILITOT 0.2*  --   PROT 7.7  --   ALBUMIN 3.0* 3.2*   No results found for this basename: LIPASE, AMYLASE,  in the last 168 hours No results found for this basename: AMMONIA,  in the last 168 hours CBC:  Recent Labs Lab 05/18/13 1718 05/19/13 0822 05/21/13 0700  WBC 6.9 13.0* 23.2*  NEUTROABS 4.4  --   --   HGB 11.3* 12.9 11.6*  HCT 32.6* 37.1 33.3*  MCV 89.8 89.4 89.0  PLT 263 285 275   Cardiac  Enzymes: No results found for this basename: CKTOTAL, CKMB, CKMBINDEX, TROPONINI,  in the last 168 hours BNP (last 3 results) No results found for this basename: PROBNP,  in the last 8760 hours CBG:  Recent Labs Lab 05/20/13 1546 05/20/13 2043 05/21/13 0006 05/21/13 0408 05/21/13 1141  GLUCAP 145* 180* 116* 129* 91    Recent Results (from the past 240 hour(s))  MRSA PCR SCREENING     Status: None   Collection Time    05/19/13  4:54 AM      Result Value Ref Range Status   MRSA by PCR NEGATIVE  NEGATIVE Final   Comment:            The GeneXpert MRSA Assay (FDA     approved for NASAL specimens     only), is one component of a     comprehensive MRSA colonization     surveillance program. It is not     intended to diagnose MRSA     infection nor to guide or      monitor treatment for     MRSA infections.  CULTURE, BLOOD (ROUTINE X 2)     Status: None   Collection Time    05/19/13  2:05 PM      Result Value Ref Range Status   Specimen Description BLOOD RIGHT ANTECUBITAL   Final   Special Requests BOTTLES DRAWN AEROBIC ONLY 10CC   Final   Culture  Setup Time     Final   Value: 05/19/2013 18:50     Performed at [facility]   Culture     Final   Value: GRAM POSITIVE COCCI IN CHAINS     Note: Gram Stain Report Called to,Read Back By and Verified With: ANDY B @ [phone] 05/20/13 BY KRAWS     Performed at [facility]   Report Status PENDING   Incomplete  CULTURE, BLOOD (ROUTINE X 2)     Status: None   Collection Time    05/19/13  2:15 PM      Result Value Ref Range Status   Specimen Description BLOOD RIGHT HAND   Final   Special Requests BOTTLES DRAWN AEROBIC ONLY 10CC   Final   Culture  Setup Time     Final   Value: 05/19/2013 18:50     Performed at [facility]   Culture     Final   Value:        BLOOD CULTURE RECEIVED NO GROWTH TO DATE CULTURE WILL BE HELD FOR 5 DAYS BEFORE ISSUING A FINAL NEGATIVE REPORT     Performed at [facility]   Report Status PENDING   Incomplete     Studies: Dg Chest 1 View  05/19/2013   CLINICAL DATA:  Confusion.  EXAM: CHEST - 1 VIEW  COMPARISON:  05/18/2013 and 08/06/2012  FINDINGS: There is a small patchy area of density in the left midzone. The lungs are otherwise clear. Heart size and vascularity are normal. No osseous abnormality.  IMPRESSION: Small new area of atelectasis/ infiltrate in the left midzone.   Electronically Signed   By: Rozetta Nunnery M.D.   On: 05/19/2013 20:41   Ct Head Wo Contrast  05/19/2013   CLINICAL DATA:  Confusion.  Dialysis patient  EXAM: CT HEAD WITHOUT CONTRAST  TECHNIQUE: Contiguous axial images were obtained from the base of the skull through the vertex without intravenous contrast.  COMPARISON:  CT 05/18/2013  FINDINGS: Generalized atrophy. Moderate  to advanced  chronic microvascular ischemic changes in the white matter. Chronic ischemia in the thalamus bilaterally. These findings are stable.  Negative for acute infarct.  Negative for acute hemorrhage or mass.  Mucosal edema in the paranasal sinuses. Air-fluid level left maxillary sinus. No acute skull abnormality. Atherosclerotic disease.  IMPRESSION: Atrophy and chronic microvascular ischemia. No acute intracranial abnormality.  Sinusitis with air-fluid level.   Electronically Signed   By: Franchot Gallo M.D.   On: 05/19/2013 12:53    Scheduled Meds: . amLODipine  10 mg Oral Daily  . aspirin EC  81 mg Oral Daily  . calcitRIOL  0.25 mcg Oral Daily  . carvedilol  25 mg Oral BID WC  . haloperidol  2 mg Oral Once  . heparin  5,000 Units Subcutaneous 3 times per day  . [START ON 05/22/2013] heparin  6,000 Units Dialysis Once in dialysis  . hydrALAZINE  100 mg Oral TID  . insulin aspart  0-9 Units Subcutaneous 6 times per day  . insulin glargine  10 Units Subcutaneous QHS  . piperacillin-tazobactam (ZOSYN)  IV  2.25 g Intravenous Q8H  . quinapril  20 mg Oral Daily  . sodium chloride  3 mL Intravenous Q12H  . sodium chloride  3 mL Intravenous Q12H  . vancomycin  500 mg Intravenous Q T,Th,Sa-HD   Continuous Infusions:    Charlynne Cousins  [facility] Pager [phone]. If 8PM-8AM, please contact night-coverage at [email or web address], password [email or web address] 05/21/2013, 12:30 PM  LOS: 3 days

## 2013-05-21 NOTE — Progress Notes (Addendum)
[facility] Progress Note   Subjective: Responsive, says she is still coughing, answers questions  Filed Vitals:   05/21/13 0802 05/21/13 0824 05/21/13 0836 05/21/13 0851  BP: 99/57 86/58 137/62 88/47  Pulse: 100 99 94 99  Temp:      TempSrc:      Resp: 23 17 17 18   Height:      Weight:      SpO2:      Exam More verbal today, follows commands No jvd, neck supple Chest clear bilat RRR no MRG Abd soft, nt, nd Ischemic skin changes about 1st and 2nd digits on R foot but no signs of infection, no LE edema Left AVG +bruit no signs of infection Neuro is nf, "1999" , "[place]"  Dialysis: [facility] [place] 3h 60min   59.5kg   2K/2.0 Bath   Heparin 6000   L arm AVG  Hectorol 2     Epo 1200     Venofer none Recent lab: Hb 11.1 tsat 59% ferritin 1192 phos 6.8  pth 331  Home meds: norvasc, rocaltrol, coreg, lasix, hydralazine, Lantus, oxycodone, accupril, neurontin 600 tid  Assessment: 1 Encephalopathy- a little better, unclear baseline; meds (neurontin), infection 2 Fever / infiltrate / [place] / cough- possible PNA, on vanc/zosyn D#2 2 ESRD on HD 3 HTN/volume- 3 BP meds, low on volume today, below dry wt 4 2HPT- holding phoslo/vit D for slightly high Ca, may need nonCa binder 5 Anemia- holding esa for now 6 DM on insulin  Plan- HD today, no fluid off. Max dose of neurontin in this pt would be 300mg  total per day (small, esrd) if restarted.   Kelly Splinter MD  pager [phone]    cell [phone]  05/21/2013, 9:04 AM     Recent Labs Lab 05/18/13 1718 05/21/13 0701  NA 142 140  K 4.8 4.0  CL 99 93*  CO2 21 21  GLUCOSE 183* 108*  BUN 75* 76*  CREATININE 11.43* 11.51*  CALCIUM 9.6 9.7  PHOS 6.1* 7.4*    Recent Labs Lab 05/18/13 1718 05/21/13 0701  AST 8  --   ALT 8  --   ALKPHOS 129*  --   BILITOT 0.2*  --   PROT 7.7  --   ALBUMIN 3.0* 3.2*    Recent Labs Lab 05/18/13 1718 05/19/13 0822 05/21/13 0700  WBC 6.9 13.0* 23.2*  NEUTROABS 4.4  --    --   HGB 11.3* 12.9 11.6*  HCT 32.6* 37.1 33.3*  MCV 89.8 89.4 89.0  PLT 263 285 275   . amLODipine  10 mg Oral Daily  . aspirin EC  81 mg Oral Daily  . calcitRIOL  0.25 mcg Oral Daily  . carvedilol  25 mg Oral BID WC  . haloperidol  2 mg Oral Once  . heparin  5,000 Units Subcutaneous 3 times per day  . [START ON 05/22/2013] heparin  6,000 Units Dialysis Once in dialysis  . hydrALAZINE  100 mg Oral TID  . insulin aspart  0-9 Units Subcutaneous 6 times per day  . insulin glargine  10 Units Subcutaneous QHS  . piperacillin-tazobactam (ZOSYN)  IV  2.25 g Intravenous Q8H  . quinapril  20 mg Oral Daily  . sodium chloride  3 mL Intravenous Q12H  . sodium chloride  3 mL Intravenous Q12H  . vancomycin  500 mg Intravenous Q T,Th,Sa-HD     sodium chloride, sodium chloride, sodium chloride, acetaminophen, acetaminophen, alteplase, feeding supplement (NEPRO CARB STEADY), haloperidol lactate,  heparin, hydrALAZINE, lidocaine (PF), lidocaine-prilocaine, ondansetron (ZOFRAN) IV, ondansetron, pentafluoroprop-tetrafluoroeth, polyethylene glycol, sodium chloride

## 2013-05-21 NOTE — Progress Notes (Signed)
Patient's family refused medications and states he thinks medications given to patient is the cause of her lethargy.

## 2013-05-21 NOTE — Progress Notes (Signed)
Utilization review completed.  

## 2013-05-21 NOTE — Telephone Encounter (Addendum)
Message copied by Gena Fray on Tue May 21, 2013  4:02 PM ------      Message from: Ruta Hinds E      Created: Mon May 20, 2013  4:59 PM       Daily visit charge today to review ABI and assess mental status            Needs appt in 2-3 weeks to recheck recovery from current event and plan arteriogram            Juanda Crumble ------  05/21/13: lm for pt re appt, dpm

## 2013-05-21 NOTE — Procedures (Signed)
I was present at this dialysis session, have reviewed the session itself and made  appropriate changes  Kelly Splinter MD (pgr) [phone]    ([phone] 05/21/2013, 9:00 AM

## 2013-05-22 DIAGNOSIS — D72829 Elevated white blood cell count, unspecified: Secondary | ICD-10-CM

## 2013-05-22 LAB — CBC
HCT: 31 % — ABNORMAL LOW (ref 36.0–46.0)
Hemoglobin: 10.9 g/dL — ABNORMAL LOW (ref 12.0–15.0)
MCH: 31.3 pg (ref 26.0–34.0)
MCHC: 35.2 g/dL (ref 30.0–36.0)
MCV: 89.1 fL (ref 78.0–100.0)
Platelets: 295 K/uL (ref 150–400)
RBC: 3.48 MIL/uL — ABNORMAL LOW (ref 3.87–5.11)
RDW: 14.1 % (ref 11.5–15.5)
WBC: 25 K/uL — ABNORMAL HIGH (ref 4.0–10.5)

## 2013-05-22 LAB — CULTURE, BLOOD (ROUTINE X 2)

## 2013-05-22 LAB — GLUCOSE, CAPILLARY
Glucose-Capillary: 191 mg/dL — ABNORMAL HIGH (ref 70–99)
Glucose-Capillary: 208 mg/dL — ABNORMAL HIGH (ref 70–99)
Glucose-Capillary: 242 mg/dL — ABNORMAL HIGH (ref 70–99)
Glucose-Capillary: 257 mg/dL — ABNORMAL HIGH (ref 70–99)
Glucose-Capillary: 271 mg/dL — ABNORMAL HIGH (ref 70–99)
Glucose-Capillary: 271 mg/dL — ABNORMAL HIGH (ref 70–99)

## 2013-05-22 MED ORDER — AMLODIPINE BESYLATE 5 MG PO TABS
5.0000 mg | ORAL_TABLET | Freq: Two times a day (BID) | ORAL | Status: DC
  Filled 2013-05-22 (×3): qty 1

## 2013-05-22 MED ORDER — HYDRALAZINE HCL 25 MG PO TABS
25.0000 mg | ORAL_TABLET | Freq: Three times a day (TID) | ORAL | Status: DC
  Filled 2013-05-22 (×5): qty 1

## 2013-05-22 NOTE — Progress Notes (Addendum)
Inpatient Diabetes Program Recommendations  AACE/ADA: New Consensus Statement on Inpatient Glycemic Control (2013)  Target Ranges:  Prepandial:   less than 140 mg/dL      Peak postprandial:   less than 180 mg/dL (1-2 hours)      Critically ill patients:  140 - 180 mg/dL     Results for [name], [name] (MRN [id number]) as of 05/22/2013 10:12  Ref. Range 05/21/2013 00:06 05/21/2013 04:08 05/21/2013 11:41 05/21/2013 17:05 05/21/2013 20:30  Glucose-Capillary Latest Range: 70-99 mg/dL 116 (H) 129 (H) 91 243 (H) 238 (H)    Results for [name], [name] (MRN [id number]) as of 05/22/2013 10:12  Ref. Range 05/22/2013 00:03 05/22/2013 04:07 05/22/2013 07:48  Glucose-Capillary Latest Range: 70-99 mg/dL 242 (H) 271 (H) 191 (H)    **Patient/Patient's family refusing insulin at times.  Refused Novolog SSI yesterday at bedtime and refused Novolog SSI early this AM at midnight.  **Patient also refused Lantus 10 units last night at bedtime  **When patient takes Novolog SSI, CBGs look better (pt took 5 units Novolog at 4am today and CBG by 8am was down to 191 mg/dl)   Will follow. Wyn Quaker RN, MSN, CDE Diabetes Coordinator Inpatient Diabetes Program Team Pager: [phone] (8a-10p)

## 2013-05-22 NOTE — Progress Notes (Signed)
650 mg of Tylenol given for a temperature of 100.3. Temperature equals 99.5 an hour after tylenol was given. Will continue to monitor.

## 2013-05-22 NOTE — Progress Notes (Signed)
[facility]                                                                              Patient Demographics  [name], is a 63 y.o. female, DOB - 1950/07/20, OR:[id number]  Admit date - 05/18/2013   Admitting Physician Verlee Monte, MD  Outpatient Primary MD for the patient is HODGES,BETH, MD  LOS - 4   No chief complaint on file.       Assessment & Plan   Metabolic encephalopathy/ Uremic encephalopathy:  -Improving -Continue vancomycin and Zosyn -Patient remained afebrile, chest x-ray shows no active cardiopulmonary disease -Continue Haldol as needed -Will continue to monitor patient  Type II or unspecified type diabetes mellitus  -Continue his scale with CBG monitoring  End stage renal disease requiring hemodialysis -Nephrology consulted and following -Continue calcitriol  Malignant HTN:  -Controlled, continue amlodipine, Coreg, hydralazine, Accupril  -May have been elevated to altered mental status, psychosis  Right foot ischemic changes:  -Vascular surgery consulted, ABI (0.37) showed severe PVD on the right. -Follow up with Dr. Oneida Alar as an outpatient in 2 weeks  Code Status: Full  Family Communication: None at bedside  Disposition Plan: Admitted.   Time Spent in minutes  30 minutes  Procedures  ABI Right = severe arterial disease. Left = moderate arterial disease  Consults   Nephrology Vascular surgery  DVT Prophylaxis  Heparin  Lab Results  Component Value Date   PLT 295 05/22/2013    Medications  Scheduled Meds: . amLODipine  10 mg Oral Daily  . aspirin EC  81 mg Oral Daily  . calcitRIOL  0.25 mcg Oral Daily  . carvedilol  25 mg Oral BID WC  . haloperidol  2 mg Oral Once  . heparin  5,000 Units Subcutaneous 3 times per day  . heparin  6,000 Units Dialysis Once in dialysis  . hydrALAZINE  100 mg Oral TID  . insulin aspart  0-9 Units Subcutaneous 6 times per day  . insulin glargine  10 Units Subcutaneous QHS  .  piperacillin-tazobactam (ZOSYN)  IV  2.25 g Intravenous Q8H  . quinapril  20 mg Oral Daily  . sodium chloride  3 mL Intravenous Q12H  . sodium chloride  3 mL Intravenous Q12H  . vancomycin  500 mg Intravenous Q T,Th,Sa-HD   Continuous Infusions:  PRN Meds:.sodium chloride, sodium chloride, sodium chloride, acetaminophen, acetaminophen, feeding supplement (NEPRO CARB STEADY), haloperidol lactate, heparin, hydrALAZINE, lidocaine (PF), lidocaine-prilocaine, ondansetron (ZOFRAN) IV, ondansetron, pentafluoroprop-tetrafluoroeth, polyethylene glycol, sodium chloride  Antibiotics    Anti-infectives   Start     Dose/Rate Route Frequency Ordered Stop   05/21/13 1200  vancomycin (VANCOCIN) 500 mg in sodium chloride 0.9 % 100 mL IVPB     500 mg 100 mL/hr over 60 Minutes Intravenous Every T-Th-Sa (Hemodialysis) 05/20/13 1828     05/20/13 1930  vancomycin (VANCOCIN) 1,250 mg in sodium chloride 0.9 % 250 mL IVPB     1,250 mg 166.7 mL/hr over 90 Minutes Intravenous  Once 05/20/13 1828 05/20/13 2246   05/20/13 1800  piperacillin-tazobactam (ZOSYN) IVPB 2.25 g     2.25 g 100 mL/hr over 30 Minutes Intravenous Every 8 hours 05/20/13 1733  Subjective:   [name] seen and examined today.  Patient has no complaints this morning.  She would like her walker.    Objective:   Filed Vitals:   05/22/13 0412 05/22/13 0500 05/22/13 0520 05/22/13 0752  BP: 134/64   124/67  Pulse: 100   90  Temp: 100.3 F (37.9 C)  99.5 F (37.5 C) 99.3 F (37.4 C)  TempSrc: Oral  Oral Oral  Resp: 16   16  Height:      Weight:  58.3 kg (128 lb 8.5 oz)    SpO2: 93%   98%    Wt Readings from Last 3 Encounters:  05/22/13 58.3 kg (128 lb 8.5 oz)  11/02/12 68.266 kg (150 lb 8 oz)  09/14/12 65.772 kg (145 lb)     Intake/Output Summary (Last 24 hours) at 05/22/13 0849 Last data filed at 05/22/13 0300  Gross per 24 hour  Intake    440 ml  Output     20 ml  Net    420 ml    Exam  General: Well  developed, well nourished, NAD, appears stated age  HEENT: NCAT, PERRLA, EOMI, Anicteic Sclera, mucous membranes moist.  Neck: Supple, no JVD, no masses  Cardiovascular: S1 S2 auscultated, no rubs, murmurs or gallops. Regular rate and rhythm.  Respiratory: Clear to auscultation bilaterally with equal chest rise  Abdomen: Soft, nontender, nondistended, + bowel sounds  Extremities: warm dry without cyanosis clubbing or edema  Neuro: AAO to self and place, cranial nerves grossly intact.   Skin: Without rashes exudates or nodules  Psych: Normal affect and demeanor  Data Review   Micro Results Recent Results (from the past 240 hour(s))  MRSA PCR SCREENING     Status: None   Collection Time    05/19/13  4:54 AM      Result Value Ref Range Status   MRSA by PCR NEGATIVE  NEGATIVE Final   Comment:            The GeneXpert MRSA Assay (FDA     approved for NASAL specimens     only), is one component of a     comprehensive MRSA colonization     surveillance program. It is not     intended to diagnose MRSA     infection nor to guide or     monitor treatment for     MRSA infections.  CULTURE, BLOOD (ROUTINE X 2)     Status: None   Collection Time    05/19/13  2:05 PM      Result Value Ref Range Status   Specimen Description BLOOD RIGHT ANTECUBITAL   Final   Special Requests BOTTLES DRAWN AEROBIC ONLY 10CC   Final   Culture  Setup Time     Final   Value: 05/19/2013 18:50     Performed at [facility]   Culture     Final   Value: GRAM POSITIVE COCCI IN CHAINS     Note: Gram Stain Report Called to,Read Back By and Verified With: ANDY B @ [phone] 05/20/13 BY KRAWS     Performed at [facility]   Report Status PENDING   Incomplete  CULTURE, BLOOD (ROUTINE X 2)     Status: None   Collection Time    05/19/13  2:15 PM      Result Value Ref Range Status   Specimen Description BLOOD RIGHT HAND   Final   Special Requests BOTTLES DRAWN AEROBIC ONLY 10CC  Final   Culture   Setup Time     Final   Value: 05/19/2013 18:50     Performed at [facility]   Culture     Final   Value:        BLOOD CULTURE RECEIVED NO GROWTH TO DATE CULTURE WILL BE HELD FOR 5 DAYS BEFORE ISSUING A FINAL NEGATIVE REPORT     Performed at [facility]   Report Status PENDING   Incomplete    Radiology Reports Dg Chest 1 View  05/19/2013   CLINICAL DATA:  Confusion.  EXAM: CHEST - 1 VIEW  COMPARISON:  05/18/2013 and 08/06/2012  FINDINGS: There is a small patchy area of density in the left midzone. The lungs are otherwise clear. Heart size and vascularity are normal. No osseous abnormality.  IMPRESSION: Small new area of atelectasis/ infiltrate in the left midzone.   Electronically Signed   By: Rozetta Nunnery M.D.   On: 05/19/2013 20:41   Ct Head Wo Contrast  05/19/2013   CLINICAL DATA:  Confusion.  Dialysis patient  EXAM: CT HEAD WITHOUT CONTRAST  TECHNIQUE: Contiguous axial images were obtained from the base of the skull through the vertex without intravenous contrast.  COMPARISON:  CT 05/18/2013  FINDINGS: Generalized atrophy. Moderate to advanced chronic microvascular ischemic changes in the white matter. Chronic ischemia in the thalamus bilaterally. These findings are stable.  Negative for acute infarct.  Negative for acute hemorrhage or mass.  Mucosal edema in the paranasal sinuses. Air-fluid level left maxillary sinus. No acute skull abnormality. Atherosclerotic disease.  IMPRESSION: Atrophy and chronic microvascular ischemia. No acute intracranial abnormality.  Sinusitis with air-fluid level.   Electronically Signed   By: Franchot Gallo M.D.   On: 05/19/2013 12:53   Dg Chest Port 1 View  05/21/2013   CLINICAL DATA:  62 year old female with shortness of breath.  EXAM: PORTABLE CHEST - 1 VIEW  COMPARISON:  05/19/2013 and prior chest radiographs  FINDINGS: Cardiomegaly identified.  There is no evidence of focal airspace disease, pulmonary edema, suspicious pulmonary nodule/mass,  pleural effusion, or pneumothorax. No acute bony abnormalities are identified.  IMPRESSION: Cardiomegaly without evidence of active cardiopulmonary disease.   Electronically Signed   By: Hassan Rowan M.D.   On: 05/21/2013 21:00    CBC  Recent Labs Lab 05/18/13 1718 05/19/13 0822 05/21/13 0700 05/22/13 0750  WBC 6.9 13.0* 23.2* 25.0*  HGB 11.3* 12.9 11.6* 10.9*  HCT 32.6* 37.1 33.3* 31.0*  PLT 263 285 275 295  MCV 89.8 89.4 89.0 89.1  MCH 31.1 31.1 31.0 31.3  MCHC 34.7 34.8 34.8 35.2  RDW 14.2 14.2 14.1 14.1  LYMPHSABS 2.0  --   --   --   MONOABS 0.4  --   --   --   EOSABS 0.1  --   --   --   BASOSABS 0.0  --   --   --     Chemistries   Recent Labs Lab 05/18/13 1718 05/21/13 0701  NA 142 140  K 4.8 4.0  CL 99 93*  CO2 21 21  GLUCOSE 183* 108*  BUN 75* 76*  CREATININE 11.43* 11.51*  CALCIUM 9.6 9.7  MG 2.8*  --   AST 8  --   ALT 8  --   ALKPHOS 129*  --   BILITOT 0.2*  --    ------------------------------------------------------------------------------------------------------------------ estimated creatinine clearance is 4.2 ml/min (by C-G formula based on Cr of 11.51). ------------------------------------------------------------------------------------------------------------------ No results found for this  basename: HGBA1C,  in the last 72 hours ------------------------------------------------------------------------------------------------------------------ No results found for this basename: CHOL, HDL, LDLCALC, TRIG, CHOLHDL, LDLDIRECT,  in the last 72 hours ------------------------------------------------------------------------------------------------------------------ No results found for this basename: TSH, T4TOTAL, FREET3, T3FREE, THYROIDAB,  in the last 72 hours ------------------------------------------------------------------------------------------------------------------ No results found for this basename: VITAMINB12, FOLATE, FERRITIN, TIBC, IRON,  RETICCTPCT,  in the last 72 hours  Coagulation profile No results found for this basename: INR, PROTIME,  in the last 168 hours  No results found for this basename: DDIMER,  in the last 72 hours  Cardiac Enzymes No results found for this basename: CK, CKMB, TROPONINI, MYOGLOBIN,  in the last 168 hours ------------------------------------------------------------------------------------------------------------------ No components found with this basename: POCBNP,     [name] D.O. on 05/22/2013 at 8:49 AM  Between 7am to 7pm - Pager - [phone]  After 7pm go to [email or web address] - password [email or web address]  And look for the night coverage person covering for me after hours  [facility] Office  [phone]

## 2013-05-22 NOTE — Progress Notes (Signed)
[facility] Progress Note   Subjective: Responsive, says she is still coughing, answers questions  Filed Vitals:   05/22/13 0500 05/22/13 0520 05/22/13 0752 05/22/13 1157  BP:   124/67 104/61  Pulse:   90 80  Temp:  99.5 F (37.5 C) 99.3 F (37.4 C) 99.2 F (37.3 C)  TempSrc:  Oral Oral Oral  Resp:   16 15  Height:      Weight: 58.3 kg (128 lb 8.5 oz)     SpO2:   98% 100%  Exam More verbal today, follows commands No jvd, neck supple Chest clear bilat RRR no MRG Abd soft, nt, nd Ischemic skin changes about 1st and 2nd digits on R foot but no signs of infection, no LE edema Left AVG +bruit no signs of infection Neuro is nf, "1999" , "[place]"  Dialysis: T[facility] 3h 7min   59.5kg   2K/2.0 [place]   Heparin 6000   L arm AVG  Hectorol 2     Epo 1200     Venofer none Recent lab: Hb 11.1 tsat 59% ferritin 1192 phos 6.8  pth 331  Assessment: 1 AMS- improving 2 HCAP- on vanc/zosyn, WBC starting to come down 2 ESRD on HD 3 HTN/volume- 4 BP meds listed as "home meds" but family says she is not taking that much BP medication, one maybe two BP meds at home.  Family also notes that when her BP is too low she gets really "sleepy".  Have cut back on BP regimen and keep BP over 120-130 range for now.   4 2HPT- holding phoslo/vit D for slightly high Ca, may need nonCa binder 5 Anemia- holding esa for now 6 DM on insulin  Plan- HD tomorrow, reduced BP meds, keep BP high normal    Kelly Splinter MD  pager [phone]    cell [phone]  05/22/2013, 3:17 PM     Recent Labs Lab 05/18/13 1718 05/21/13 0701  NA 142 140  K 4.8 4.0  CL 99 93*  CO2 21 21  GLUCOSE 183* 108*  BUN 75* 76*  CREATININE 11.43* 11.51*  CALCIUM 9.6 9.7  PHOS 6.1* 7.4*    Recent Labs Lab 05/18/13 1718 05/21/13 0701  AST 8  --   ALT 8  --   ALKPHOS 129*  --   BILITOT 0.2*  --   PROT 7.7  --   ALBUMIN 3.0* 3.2*    Recent Labs Lab 05/18/13 1718 05/19/13 0822 05/21/13 0700  05/22/13 0750  WBC 6.9 13.0* 23.2* 25.0*  NEUTROABS 4.4  --   --   --   HGB 11.3* 12.9 11.6* 10.9*  HCT 32.6* 37.1 33.3* 31.0*  MCV 89.8 89.4 89.0 89.1  PLT 263 285 275 295   . amLODipine  5 mg Oral BID  . aspirin EC  81 mg Oral Daily  . calcitRIOL  0.25 mcg Oral Daily  . haloperidol  2 mg Oral Once  . heparin  5,000 Units Subcutaneous 3 times per day  . heparin  6,000 Units Dialysis Once in dialysis  . hydrALAZINE  25 mg Oral TID  . insulin aspart  0-9 Units Subcutaneous 6 times per day  . insulin glargine  10 Units Subcutaneous QHS  . piperacillin-tazobactam (ZOSYN)  IV  2.25 g Intravenous Q8H  . sodium chloride  3 mL Intravenous Q12H  . sodium chloride  3 mL Intravenous Q12H  . vancomycin  500 mg Intravenous Q T,Th,Sa-HD     sodium chloride,  sodium chloride, sodium chloride, acetaminophen, acetaminophen, feeding supplement (NEPRO CARB STEADY), haloperidol lactate, heparin, hydrALAZINE, lidocaine (PF), lidocaine-prilocaine, ondansetron (ZOFRAN) IV, ondansetron, pentafluoroprop-tetrafluoroeth, polyethylene glycol, sodium chloride

## 2013-05-22 NOTE — Progress Notes (Signed)
Patient's family (grand-daughter) called and questioned medications patient has been taking. She states her medications have been making her lethargic and confused. She wants MD to call her in the morning (contact number under patient's contact information), will pass information to morning RN.

## 2013-05-23 LAB — BASIC METABOLIC PANEL
BUN: 62 mg/dL — ABNORMAL HIGH (ref 6–23)
CALCIUM: 9.4 mg/dL (ref 8.4–10.5)
CO2: 22 mEq/L (ref 19–32)
CREATININE: 8.63 mg/dL — AB (ref 0.50–1.10)
Chloride: 87 mEq/L — ABNORMAL LOW (ref 96–112)
GFR calc Af Amer: 5 mL/min — ABNORMAL LOW (ref >90)
GFR calc non Af Amer: 4 mL/min — ABNORMAL LOW (ref >90)
GLUCOSE: 308 mg/dL — AB (ref 70–99)
Potassium: 3.7 mEq/L (ref 3.7–5.3)
SODIUM: 134 meq/L — AB (ref 137–147)

## 2013-05-23 LAB — CBC
HCT: 32 % — ABNORMAL LOW (ref 36.0–46.0)
HEMOGLOBIN: 11.1 g/dL — AB (ref 12.0–15.0)
MCH: 31 pg (ref 26.0–34.0)
MCHC: 34.7 g/dL (ref 30.0–36.0)
MCV: 89.4 fL (ref 78.0–100.0)
Platelets: 319 10*3/uL (ref 150–400)
RBC: 3.58 MIL/uL — ABNORMAL LOW (ref 3.87–5.11)
RDW: 14.2 % (ref 11.5–15.5)
WBC: 21.2 10*3/uL — AB (ref 4.0–10.5)

## 2013-05-23 LAB — GLUCOSE, CAPILLARY
Glucose-Capillary: 125 mg/dL — ABNORMAL HIGH (ref 70–99)
Glucose-Capillary: 178 mg/dL — ABNORMAL HIGH (ref 70–99)
Glucose-Capillary: 192 mg/dL — ABNORMAL HIGH (ref 70–99)
Glucose-Capillary: 214 mg/dL — ABNORMAL HIGH (ref 70–99)
Glucose-Capillary: 219 mg/dL — ABNORMAL HIGH (ref 70–99)
Glucose-Capillary: 279 mg/dL — ABNORMAL HIGH (ref 70–99)

## 2013-05-23 MED ORDER — RENA-VITE PO TABS
1.0000 | ORAL_TABLET | Freq: Every day | ORAL | Status: DC
  Administered 2013-05-23 – 2013-05-26 (×4): 1 via ORAL
  Filled 2013-05-23 (×5): qty 1

## 2013-05-23 MED ORDER — DOXERCALCIFEROL 4 MCG/2ML IV SOLN
1.0000 ug | INTRAVENOUS | Status: DC
  Administered 2013-05-23: 1 ug via INTRAVENOUS
  Filled 2013-05-23: qty 2

## 2013-05-23 MED ORDER — SODIUM CHLORIDE 0.9 % IV SOLN: 100.0000 mL | INTRAVENOUS | Status: DC | PRN

## 2013-05-23 MED ORDER — LIDOCAINE HCL (PF) 1 % IJ SOLN: 5.0000 mL | INTRAMUSCULAR | Status: DC | PRN

## 2013-05-23 MED ORDER — DOXERCALCIFEROL 4 MCG/2ML IV SOLN
INTRAVENOUS | Status: AC
  Filled 2013-05-23: qty 2

## 2013-05-23 MED ORDER — INSULIN GLARGINE 100 UNIT/ML ~~LOC~~ SOLN
14.0000 [IU] | Freq: Every day | SUBCUTANEOUS | Status: DC
  Administered 2013-05-23 – 2013-05-26 (×4): 14 [IU] via SUBCUTANEOUS
  Filled 2013-05-23 (×5): qty 0.14

## 2013-05-23 MED ORDER — HEPARIN SODIUM (PORCINE) 1000 UNIT/ML DIALYSIS: 6000.0000 [IU] | Freq: Once | INTRAMUSCULAR | Status: DC

## 2013-05-23 MED ORDER — CALCIUM ACETATE 667 MG PO CAPS
667.0000 mg | ORAL_CAPSULE | Freq: Three times a day (TID) | ORAL | Status: DC
  Administered 2013-05-23 – 2013-05-27 (×12): 667 mg via ORAL
  Filled 2013-05-23 (×15): qty 1

## 2013-05-23 MED ORDER — LIDOCAINE-PRILOCAINE 2.5-2.5 % EX CREA: 1.0000 "application " | TOPICAL_CREAM | CUTANEOUS | Status: DC | PRN

## 2013-05-23 MED ORDER — NEPRO/CARBSTEADY PO LIQD: 237.0000 mL | ORAL | Status: DC | PRN

## 2013-05-23 MED ORDER — PENTAFLUOROPROP-TETRAFLUOROETH EX AERO: 1.0000 "application " | INHALATION_SPRAY | CUTANEOUS | Status: DC | PRN

## 2013-05-23 MED ORDER — ALTEPLASE 2 MG IJ SOLR: 2.0000 mg | Freq: Once | INTRAMUSCULAR | Status: DC | PRN

## 2013-05-23 MED ORDER — NEPRO/CARBSTEADY PO LIQD
237.0000 mL | Freq: Two times a day (BID) | ORAL | Status: DC
  Administered 2013-05-23 – 2013-05-27 (×7): 237 mL via ORAL
  Filled 2013-05-23: qty 237

## 2013-05-23 MED ORDER — HEPARIN SODIUM (PORCINE) 1000 UNIT/ML DIALYSIS: 1000.0000 [IU] | INTRAMUSCULAR | Status: DC | PRN

## 2013-05-23 NOTE — Procedures (Signed)
I was present at this dialysis session, have reviewed the session itself and made  appropriate changes  Kelly Splinter MD (pgr) [phone]    [phone] 05/23/2013, 12:55 PM

## 2013-05-23 NOTE — Progress Notes (Signed)
ANTIBIOTIC CONSULT NOTE - FOLLOW UP  Pharmacy Consult for Vancomycin and Zosyn Indication: rule out pneumonia, bacteremia  No Known Allergies  Patient Measurements: Height: 5\' 3"  (160 cm) Weight: 128 lb 4.9 oz (58.2 kg) IBW/kg (Calculated) : 52.4  Vital Signs: Temp: 97.9 F (36.6 C) (02/26 1155) Temp src: Oral (02/26 1155) BP: 105/80 mmHg (02/26 1155) Pulse Rate: 80 (02/26 1155) Intake/Output from previous day: 02/25 0701 - 02/26 0700 In: 390 [P.O.:240; IV Piggyback:150] Out: -  Intake/Output from this shift: Total I/O In: -  Out: 139 [Other:139]  Labs:  Recent Labs  05/21/13 0700 05/21/13 0701 05/22/13 0750 05/23/13 0405  WBC 23.2*  --  25.0* 21.2*  HGB 11.6*  --  10.9* 11.1*  PLT 275  --  295 319  CREATININE  --  11.51*  --  8.63*   Estimated Creatinine Clearance: 5.7 ml/min (by C-G formula based on Cr of 8.63).    Microbiology: Recent Results (from the past 720 hour(s))  MRSA PCR SCREENING     Status: None   Collection Time    05/19/13  4:54 AM      Result Value Ref Range Status   MRSA by PCR NEGATIVE  NEGATIVE Final   Comment:            The GeneXpert MRSA Assay (FDA     approved for NASAL specimens     only), is one component of a     comprehensive MRSA colonization     surveillance program. It is not     intended to diagnose MRSA     infection nor to guide or     monitor treatment for     MRSA infections.  CULTURE, BLOOD (ROUTINE X 2)     Status: None   Collection Time    05/19/13  2:05 PM      Result Value Ref Range Status   Specimen Description BLOOD RIGHT ANTECUBITAL   Final   Special Requests BOTTLES DRAWN AEROBIC ONLY 10CC   Final   Culture  Setup Time     Final   Value: 05/19/2013 18:50     Performed at [facility]   Culture     Final   Value: VIRIDANS STREPTOCOCCUS     Note: Gram Stain Report Called to,Read Back By and Verified With: ANDY B @ [phone] 05/20/13 BY KRAWS     Performed at [facility]   Report Status  05/22/2013 FINAL   Final  CULTURE, BLOOD (ROUTINE X 2)     Status: None   Collection Time    05/19/13  2:15 PM      Result Value Ref Range Status   Specimen Description BLOOD RIGHT HAND   Final   Special Requests BOTTLES DRAWN AEROBIC ONLY 10CC   Final   Culture  Setup Time     Final   Value: 05/19/2013 18:50     Performed at [facility]   Culture     Final   Value:        BLOOD CULTURE RECEIVED NO GROWTH TO DATE CULTURE WILL BE HELD FOR 5 DAYS BEFORE ISSUING A FINAL NEGATIVE REPORT     Performed at [facility]   Report Status PENDING   Incomplete  CULTURE, BLOOD (ROUTINE X 2)     Status: None   Collection Time    05/22/13  7:50 AM      Result Value Ref Range Status   Specimen Description BLOOD RIGHT  ANTECUBITAL   Final   Special Requests BOTTLES DRAWN AEROBIC ONLY [facility]   Final   Culture  Setup Time     Final   Value: 05/22/2013 12:40     Performed at [facility]   Culture     Final   Value:        BLOOD CULTURE RECEIVED NO GROWTH TO DATE CULTURE WILL BE HELD FOR 5 DAYS BEFORE ISSUING A FINAL NEGATIVE REPORT     Performed at [facility]   Report Status PENDING   Incomplete  CULTURE, BLOOD (ROUTINE X 2)     Status: None   Collection Time    05/22/13  7:55 AM      Result Value Ref Range Status   Specimen Description BLOOD RIGHT HAND   Final   Special Requests BOTTLES DRAWN AEROBIC ONLY 10CC   Final   Culture  Setup Time     Final   Value: 05/22/2013 12:40     Performed at [facility]   Culture     Final   Value:        BLOOD CULTURE RECEIVED NO GROWTH TO DATE CULTURE WILL BE HELD FOR 5 DAYS BEFORE ISSUING A FINAL NEGATIVE REPORT     Performed at [facility]   Report Status PENDING   Incomplete    Anti-infectives   Start     Dose/Rate Route Frequency Ordered Stop   05/21/13 1200  vancomycin (VANCOCIN) 500 mg in sodium chloride 0.9 % 100 mL IVPB     500 mg 100 mL/hr over 60 Minutes Intravenous Every T-Th-Sa (Hemodialysis)  05/20/13 1828     05/20/13 1930  vancomycin (VANCOCIN) 1,250 mg in sodium chloride 0.9 % 250 mL IVPB     1,250 mg 166.7 mL/hr over 90 Minutes Intravenous  Once 05/20/13 1828 05/20/13 2246   05/20/13 1800  piperacillin-tazobactam (ZOSYN) IVPB 2.25 g     2.25 g 100 mL/hr over 30 Minutes Intravenous Every 8 hours 05/20/13 1733        Assessment: 63 yo F admitted 6/27 with AMS/metabolic encephalopathy likely related to missed HD sessions.  Pt has had 2 HD sessions since admission and mentation has improved, although still dioriented at times.  Pt continues on Vancomycin and Zosyn for r/o PNA vs bacteremia.  1/2 blood cx growing Viridans Strep (?possible contaminant).  Repeat cx have been ordered.  Goal of Therapy:  Pre-HD Vancomycin level ~ 15-25 mcg/ml  Plan:  Continue Vancomycin 500 mg after each HD. Continue Zosyn 2.25 gm IV q8h.  [employer], Pharm.D., BCPS Clinical Pharmacist Pager [phone] 05/23/2013 1:18 PM

## 2013-05-23 NOTE — Progress Notes (Addendum)
[facility]                                                                              Patient Demographics  [name], is a 63 y.o. female, DOB - 02-22-1951, [id number]  Admit date - 05/18/2013   Admitting Physician Verlee Monte, MD  Outpatient Primary MD for the patient is HODGES,BETH, MD  LOS - 5   No chief complaint on file.       Assessment & Plan   Metabolic encephalopathy/ Uremic encephalopathy:  -Improving, patient oriented to self however not time -Continue vancomycin and Zosyn -Patient remained afebrile, chest x-ray shows no active cardiopulmonary disease -Continue Haldol as needed -Will continue to monitor patient -Repeat blood cultures (05/22/13) show no growth   HCAP -CXR 05/19/13 shows infiltrate in left mid zone -Continue vancomycin and Zosyn -Leukocytosis trending downward  Type II or unspecified type diabetes mellitus  -Continue ISS with CBG monitoring -Will add Lantus 14u QHS   End stage renal disease requiring hemodialysis -Nephrology consulted and following -Continue calcitriol  Hypertension -Controlled, BP meds discontinued by nephrology -Patient's family feels that her blood pressure medications are causing her altered mental status -Will restart blood pressure medications as needed -May have been elevated to altered mental status, psychosis  Right foot ischemic changes:  -Vascular surgery consulted, ABI (0.37) showed severe PVD on the right. -Follow up with Dr. Oneida Alar as an outpatient in 2 weeks  Code Status: Full  Family Communication: None at bedside  Disposition Plan: Admitted.   Time Spent in minutes  25 minutes  Procedures  ABI Right = severe arterial disease. Left = moderate arterial disease  Consults   Nephrology Vascular surgery  DVT Prophylaxis  Heparin  Lab Results  Component Value Date   PLT 319 05/23/2013    Medications  Scheduled Meds: . aspirin EC  81 mg Oral Daily  . calcium acetate  667 mg  Oral TID WC  . doxercalciferol      . doxercalciferol  1 mcg Intravenous Q T,Th,Sa-HD  . feeding supplement (NEPRO CARB STEADY)  237 mL Oral BID BM  . haloperidol  2 mg Oral Once  . heparin  5,000 Units Subcutaneous 3 times per day  . insulin aspart  0-9 Units Subcutaneous 6 times per day  . insulin glargine  10 Units Subcutaneous QHS  . multivitamin  1 tablet Oral QHS  . piperacillin-tazobactam (ZOSYN)  IV  2.25 g Intravenous Q8H  . sodium chloride  3 mL Intravenous Q12H  . sodium chloride  3 mL Intravenous Q12H  . vancomycin  500 mg Intravenous Q T,Th,Sa-HD   Continuous Infusions:  PRN Meds:.sodium chloride, sodium chloride, sodium chloride, acetaminophen, acetaminophen, alteplase, feeding supplement (NEPRO CARB STEADY), haloperidol lactate, heparin, hydrALAZINE, lidocaine (PF), lidocaine-prilocaine, ondansetron (ZOFRAN) IV, ondansetron, pentafluoroprop-tetrafluoroeth, polyethylene glycol, sodium chloride  Antibiotics    Anti-infectives   Start     Dose/Rate Route Frequency Ordered Stop   05/21/13 1200  vancomycin (VANCOCIN) 500 mg in sodium chloride 0.9 % 100 mL IVPB     500 mg 100 mL/hr over 60 Minutes Intravenous Every T-Th-Sa (Hemodialysis) 05/20/13 1828     05/20/13 1930  vancomycin (VANCOCIN) 1,250 mg in  sodium chloride 0.9 % 250 mL IVPB     1,250 mg 166.7 mL/hr over 90 Minutes Intravenous  Once 05/20/13 1828 05/20/13 2246   05/20/13 1800  piperacillin-tazobactam (ZOSYN) IVPB 2.25 g     2.25 g 100 mL/hr over 30 Minutes Intravenous Every 8 hours 05/20/13 1733          Subjective:   [name] seen and examined today in dialysis.  Patient has no complaints this morning.    Objective:   Filed Vitals:   05/23/13 1030 05/23/13 1100 05/23/13 1112 05/23/13 1130  BP: 115/45 103/47 138/59 115/53  Pulse: 78 79 85 77  Temp:      TempSrc:      Resp:      Height:      Weight:      SpO2:        Wt Readings from Last 3 Encounters:  05/23/13 58.4 kg (128 lb 12 oz)    11/02/12 68.266 kg (150 lb 8 oz)  09/14/12 65.772 kg (145 lb)     Intake/Output Summary (Last 24 hours) at 05/23/13 1208 Last data filed at 05/23/13 0300  Gross per 24 hour  Intake    390 ml  Output      0 ml  Net    390 ml    Exam  General: Well developed, well nourished, NAD, appears stated age  50: NCAT, mucous membranes moist.  Neck: Supple, no JVD, no masses  Cardiovascular: S1 S2 auscultated. Regular rate and rhythm.  Respiratory: Clear to auscultation bilaterally with equal chest rise  Abdomen: Soft, nontender, nondistended, + bowel sounds  Extremities: warm dry without cyanosis clubbing or edema  Neuro: AAO to self and place, however not oriented to time, no focal deficits   Skin: Without rashes exudates or nodules  Data Review   Micro Results Recent Results (from the past 240 hour(s))  MRSA PCR SCREENING     Status: None   Collection Time    05/19/13  4:54 AM      Result Value Ref Range Status   MRSA by PCR NEGATIVE  NEGATIVE Final   Comment:            The GeneXpert MRSA Assay (FDA     approved for NASAL specimens     only), is one component of a     comprehensive MRSA colonization     surveillance program. It is not     intended to diagnose MRSA     infection nor to guide or     monitor treatment for     MRSA infections.  CULTURE, BLOOD (ROUTINE X 2)     Status: None   Collection Time    05/19/13  2:05 PM      Result Value Ref Range Status   Specimen Description BLOOD RIGHT ANTECUBITAL   Final   Special Requests BOTTLES DRAWN AEROBIC ONLY 10CC   Final   Culture  Setup Time     Final   Value: 05/19/2013 18:50     Performed at [facility]   Culture     Final   Value: VIRIDANS STREPTOCOCCUS     Note: Gram Stain Report Called to,Read Back By and Verified With: ANDY B @ [phone] 05/20/13 BY KRAWS     Performed at [facility]   Report Status 05/22/2013 FINAL   Final  CULTURE, BLOOD (ROUTINE X 2)     Status: None   Collection  Time  05/19/13  2:15 PM      Result Value Ref Range Status   Specimen Description BLOOD RIGHT HAND   Final   Special Requests BOTTLES DRAWN AEROBIC ONLY 10CC   Final   Culture  Setup Time     Final   Value: 05/19/2013 18:50     Performed at [facility]   Culture     Final   Value:        BLOOD CULTURE RECEIVED NO GROWTH TO DATE CULTURE WILL BE HELD FOR 5 DAYS BEFORE ISSUING A FINAL NEGATIVE REPORT     Performed at [facility]   Report Status PENDING   Incomplete  CULTURE, BLOOD (ROUTINE X 2)     Status: None   Collection Time    05/22/13  7:50 AM      Result Value Ref Range Status   Specimen Description BLOOD RIGHT ANTECUBITAL   Final   Special Requests BOTTLES DRAWN AEROBIC ONLY [facility]   Final   Culture  Setup Time     Final   Value: 05/22/2013 12:40     Performed at [facility]   Culture     Final   Value:        BLOOD CULTURE RECEIVED NO GROWTH TO DATE CULTURE WILL BE HELD FOR 5 DAYS BEFORE ISSUING A FINAL NEGATIVE REPORT     Performed at [facility]   Report Status PENDING   Incomplete  CULTURE, BLOOD (ROUTINE X 2)     Status: None   Collection Time    05/22/13  7:55 AM      Result Value Ref Range Status   Specimen Description BLOOD RIGHT HAND   Final   Special Requests BOTTLES DRAWN AEROBIC ONLY 10CC   Final   Culture  Setup Time     Final   Value: 05/22/2013 12:40     Performed at [facility]   Culture     Final   Value:        BLOOD CULTURE RECEIVED NO GROWTH TO DATE CULTURE WILL BE HELD FOR 5 DAYS BEFORE ISSUING A FINAL NEGATIVE REPORT     Performed at [facility]   Report Status PENDING   Incomplete    Radiology Reports Dg Chest 1 View  05/19/2013   CLINICAL DATA:  Confusion.  EXAM: CHEST - 1 VIEW  COMPARISON:  05/18/2013 and 08/06/2012  FINDINGS: There is a small patchy area of density in the left midzone. The lungs are otherwise clear. Heart size and vascularity are normal. No osseous abnormality.  IMPRESSION:  Small new area of atelectasis/ infiltrate in the left midzone.   Electronically Signed   By: Rozetta Nunnery M.D.   On: 05/19/2013 20:41   Ct Head Wo Contrast  05/19/2013   CLINICAL DATA:  Confusion.  Dialysis patient  EXAM: CT HEAD WITHOUT CONTRAST  TECHNIQUE: Contiguous axial images were obtained from the base of the skull through the vertex without intravenous contrast.  COMPARISON:  CT 05/18/2013  FINDINGS: Generalized atrophy. Moderate to advanced chronic microvascular ischemic changes in the white matter. Chronic ischemia in the thalamus bilaterally. These findings are stable.  Negative for acute infarct.  Negative for acute hemorrhage or mass.  Mucosal edema in the paranasal sinuses. Air-fluid level left maxillary sinus. No acute skull abnormality. Atherosclerotic disease.  IMPRESSION: Atrophy and chronic microvascular ischemia. No acute intracranial abnormality.  Sinusitis with air-fluid level.   Electronically Signed   By: Franchot Gallo  M.D.   On: 05/19/2013 12:53   Dg Chest Port 1 View  05/21/2013   CLINICAL DATA:  63 year old female with shortness of breath.  EXAM: PORTABLE CHEST - 1 VIEW  COMPARISON:  05/19/2013 and prior chest radiographs  FINDINGS: Cardiomegaly identified.  There is no evidence of focal airspace disease, pulmonary edema, suspicious pulmonary nodule/mass, pleural effusion, or pneumothorax. No acute bony abnormalities are identified.  IMPRESSION: Cardiomegaly without evidence of active cardiopulmonary disease.   Electronically Signed   By: Hassan Rowan M.D.   On: 05/21/2013 21:00    CBC  Recent Labs Lab 05/18/13 1718 05/19/13 0822 05/21/13 0700 05/22/13 0750 05/23/13 0405  WBC 6.9 13.0* 23.2* 25.0* 21.2*  HGB 11.3* 12.9 11.6* 10.9* 11.1*  HCT 32.6* 37.1 33.3* 31.0* 32.0*  PLT 263 285 275 295 319  MCV 89.8 89.4 89.0 89.1 89.4  MCH 31.1 31.1 31.0 31.3 31.0  MCHC 34.7 34.8 34.8 35.2 34.7  RDW 14.2 14.2 14.1 14.1 14.2  LYMPHSABS 2.0  --   --   --   --   MONOABS 0.4  --    --   --   --   EOSABS 0.1  --   --   --   --   BASOSABS 0.0  --   --   --   --     Chemistries   Recent Labs Lab 05/18/13 1718 05/21/13 0701 05/23/13 0405  NA 142 140 134*  K 4.8 4.0 3.7  CL 99 93* 87*  CO2 21 21 22   GLUCOSE 183* 108* 308*  BUN 75* 76* 62*  CREATININE 11.43* 11.51* 8.63*  CALCIUM 9.6 9.7 9.4  MG 2.8*  --   --   AST 8  --   --   ALT 8  --   --   ALKPHOS 129*  --   --   BILITOT 0.2*  --   --    ------------------------------------------------------------------------------------------------------------------ estimated creatinine clearance is 5.7 ml/min (by C-G formula based on Cr of 8.63). ------------------------------------------------------------------------------------------------------------------ No results found for this basename: HGBA1C,  in the last 72 hours ------------------------------------------------------------------------------------------------------------------ No results found for this basename: CHOL, HDL, LDLCALC, TRIG, CHOLHDL, LDLDIRECT,  in the last 72 hours ------------------------------------------------------------------------------------------------------------------ No results found for this basename: TSH, T4TOTAL, FREET3, T3FREE, THYROIDAB,  in the last 72 hours ------------------------------------------------------------------------------------------------------------------ No results found for this basename: VITAMINB12, FOLATE, FERRITIN, TIBC, IRON, RETICCTPCT,  in the last 72 hours  Coagulation profile No results found for this basename: INR, PROTIME,  in the last 168 hours  No results found for this basename: DDIMER,  in the last 72 hours  Cardiac Enzymes No results found for this basename: CK, CKMB, TROPONINI, MYOGLOBIN,  in the last 168 hours ------------------------------------------------------------------------------------------------------------------ No components found with this basename: POCBNP,     Eudora Guevarra,  Devun Anna D.O. on 05/23/2013 at 12:08 PM  Between 7am to 7pm - Pager - [phone]  After 7pm go to [email or web address] - password [email or web address]  And look for the night coverage person covering for me after hours  [facility] Office  [phone]

## 2013-05-23 NOTE — Progress Notes (Signed)
650 mg of tylenol given for temperature of 101.4. Temperature equals 98.7 an hour afterwards. Will continue to monitor.

## 2013-05-23 NOTE — Progress Notes (Signed)
[facility] Progress Note  Subjective:   No c/o  Objective Filed Vitals:   05/23/13 0803 05/23/13 0825 05/23/13 0830 05/23/13 0900  BP: 109/49 102/46 119/54 113/48  Pulse:  78 84 79  Temp: 98.8 F (37.1 C)     TempSrc: Oral     Resp:      Height:      Weight: 58.4 kg (128 lb 12 oz)     SpO2: 99%      Physical Exam   Goal 900 General: NAD supine on HD Heart: RRR Lungs: no overt wheezes, rales or rhonchi Abdomen: soft NT Extremities: no edema Dialysis Access: left AVGG Qb 400 Neuro:  [name], 1950, [place]  Dialysis: TTS [facility]  3h 18min 59.5kg 2K/2.0 Bath Heparin 6000 L arm AVG  Hectorol 2 Epo 1200 Venofer none  Recent lab: Hb 11.1 tsat 59% ferritin 1192 phos 6.8 pth 331  Assessment/Plan: 1 AMS- improving; still a little confused; when I told her it was 2015, she said she gave the wrong answer because no one told her the correct year, but when I asked her again, she still said it was 29 Much more alert today 2 HCAP/ leukocytosis- on vanc/zosyn  2 ESRD TTS K 3.7 on 4 K bath 3 HTN/volume- 4 BP meds listed as "home meds" but family says she is not taking that much BP medication, one maybe two BP meds at home. Family also notes that when her BP is too low she gets really "sleepy". Have cut back on BP regimen and keep BP over 120-130 range for now BP's still too low, have stopped all BP meds, we will resume as needed if BP becomes uncontrolled  4 2HPT- phoslo/hectorol were held for slightly high Ca, may need nonCa binder - stop calcitriol; resume hectorol at 1 and resume phsolo 1 ac for now and follow labs 5 Anemia- Hgb 11.1 stable holding esa for now  6 DM on insulin 7. Nutrition - add multivit and nepro between meals  Myriam Jacobson, PA-C [place] [phone] 05/23/2013,10:05 AM  LOS: 5 days   I have seen and examined patient, discussed with PA and agree with assessment and plan as outlined above with additions as indicated. Kelly Splinter MD pager [phone]    cell [phone] 05/23/2013, 11:47 AM     Additional Objective Labs: Basic Metabolic Panel:  Recent Labs Lab 05/18/13 1718 05/21/13 0701 05/23/13 0405  NA 142 140 134*  K 4.8 4.0 3.7  CL 99 93* 87*  CO2 21 21 22   GLUCOSE 183* 108* 308*  BUN 75* 76* 62*  CREATININE 11.43* 11.51* 8.63*  CALCIUM 9.6 9.7 9.4  PHOS 6.1* 7.4*  --    Liver Function Tests:  Recent Labs Lab 05/18/13 1718 05/21/13 0701  AST 8  --   ALT 8  --   ALKPHOS 129*  --   BILITOT 0.2*  --   PROT 7.7  --   ALBUMIN 3.0* 3.2*   CBC:  Recent Labs Lab 05/18/13 1718 05/19/13 0822 05/21/13 0700 05/22/13 0750 05/23/13 0405  WBC 6.9 13.0* 23.2* 25.0* 21.2*  NEUTROABS 4.4  --   --   --   --   HGB 11.3* 12.9 11.6* 10.9* 11.1*  HCT 32.6* 37.1 33.3* 31.0* 32.0*  MCV 89.8 89.4 89.0 89.1 89.4  PLT 263 285 275 295 319   Blood Culture    Component Value Date/Time   SDES BLOOD RIGHT HAND 05/22/2013 0755  SPECREQUEST BOTTLES DRAWN AEROBIC ONLY 10CC 05/22/2013 0755   CULT  Value:        BLOOD CULTURE RECEIVED NO GROWTH TO DATE CULTURE WILL BE HELD FOR 5 DAYS BEFORE ISSUING A FINAL NEGATIVE REPORT Performed at [facility] 05/22/2013 0755   REPTSTATUS PENDING 05/22/2013 0755   CBG:  Recent Labs Lab 05/22/13 1640 05/22/13 2004 05/23/13 0003 05/23/13 0400 05/23/13 0721  GLUCAP 271* 208* 192* 279* 178*  Medications:   . amLODipine  5 mg Oral BID  . aspirin EC  81 mg Oral Daily  . calcitRIOL  0.25 mcg Oral Daily  . haloperidol  2 mg Oral Once  . heparin  5,000 Units Subcutaneous 3 times per day  . hydrALAZINE  25 mg Oral TID  . insulin aspart  0-9 Units Subcutaneous 6 times per day  . insulin glargine  10 Units Subcutaneous QHS  . piperacillin-tazobactam (ZOSYN)  IV  2.25 g Intravenous Q8H  . sodium chloride  3 mL Intravenous Q12H  . sodium chloride  3 mL Intravenous Q12H  . vancomycin  500 mg Intravenous Q T,Th,Sa-HD

## 2013-05-23 NOTE — Progress Notes (Signed)
Patient's daughter states mother should not be given her blood pressure medicines since she thinks it is the cause of her lethargy. Notified charge nurse, daughter spoke with charge nurse. Notified NP on call as well. Will continue to monitor patient.

## 2013-05-23 NOTE — Progress Notes (Signed)
Inpatient Diabetes Program Recommendations  AACE/ADA: New Consensus Statement on Inpatient Glycemic Control (2013)  Target Ranges:  Prepandial:   less than 140 mg/dL      Peak postprandial:   less than 180 mg/dL (1-2 hours)      Critically ill patients:  140 - 180 mg/dL   Results for [name], [name] (MRN [id number]) as of 05/23/2013 11:33  Ref. Range 05/22/2013 04:07 05/22/2013 07:48 05/22/2013 11:53 05/22/2013 16:40 05/22/2013 20:04 05/23/2013 00:03 05/23/2013 04:00 05/23/2013 07:21  Glucose-Capillary Latest Range: 70-99 mg/dL 271 (H) 191 (H) 257 (H) 271 (H) 208 (H) 192 (H) 279 (H) 178 (H)   Diabetes history: DM2 Outpatient Diabetes medications: Lantus 20 units QHS Current orders for Inpatient glycemic control: Lantus 10 units QHS, Novolog 0-9 units Q4H  Inpatient Diabetes Program Recommendations Insulin - Basal: Please consider increasing Lantus to 14 units QHS. Insulin-Correction: If patient is eating well and tolerating diet, may want to change frequency of CBGs and Novolog correction to ACHS.  Note: Noted patient refused Lantus on 2/24 so blood glucose more elevated throughout day on 2/25. According to the chart, patient did receive Lantus 10 units last night at 21:56. Fasting glucose 178 mg/dl this morning.  Please consider increasing Lantus to 14 units QHS and changing CBGs and Novolog correction to ACHS if patient is eating and tolerating diet.  Thanks, Barnie Alderman, RN, MSN, CCRN Diabetes Coordinator Inpatient Diabetes Program [phone] (Team Pager) [phone] ([facility] office) [phone] [facility] office)

## 2013-05-24 LAB — CBC
HEMATOCRIT: 32 % — AB (ref 36.0–46.0)
Hemoglobin: 10.9 g/dL — ABNORMAL LOW (ref 12.0–15.0)
MCH: 30.7 pg (ref 26.0–34.0)
MCHC: 34.1 g/dL (ref 30.0–36.0)
MCV: 90.1 fL (ref 78.0–100.0)
Platelets: 415 10*3/uL — ABNORMAL HIGH (ref 150–400)
RBC: 3.55 MIL/uL — ABNORMAL LOW (ref 3.87–5.11)
RDW: 14 % (ref 11.5–15.5)
WBC: 22.4 10*3/uL — ABNORMAL HIGH (ref 4.0–10.5)

## 2013-05-24 LAB — GLUCOSE, CAPILLARY
GLUCOSE-CAPILLARY: 124 mg/dL — AB (ref 70–99)
GLUCOSE-CAPILLARY: 114 mg/dL — AB (ref 70–99)
Glucose-Capillary: 175 mg/dL — ABNORMAL HIGH (ref 70–99)
Glucose-Capillary: 213 mg/dL — ABNORMAL HIGH (ref 70–99)
Glucose-Capillary: 245 mg/dL — ABNORMAL HIGH (ref 70–99)

## 2013-05-24 MED ORDER — LEVOFLOXACIN 500 MG PO TABS: 500.0000 mg | ORAL_TABLET | Freq: Every day | ORAL | Status: DC

## 2013-05-24 MED ORDER — RENA-VITE PO TABS: 1.0000 | ORAL_TABLET | Freq: Every day | ORAL | Status: AC

## 2013-05-24 MED ORDER — CALCIUM ACETATE 667 MG PO CAPS
667.0000 mg | ORAL_CAPSULE | Freq: Three times a day (TID) | ORAL | Status: DC
Start: 2013-05-24 — End: 2013-10-16

## 2013-05-24 NOTE — Evaluation (Signed)
Physical Therapy Evaluation Patient Details Name: [name] MRN: [id number] DOB: May 28, 1950 Today's Date: 05/24/2013 Time: 1535-1600 PT Time Calculation (min): 25 min  PT Assessment / Plan / Recommendation History of Present Illness  [name] is a 63 y.o. female  Past medical history of end-stage renal disease dialyzes Tuesday Thursdays and Saturdays, diabetes hypertension and coronary artery disease that was brought to the hospital due to confusion, missed dialysis.  Clinical Impression  Patient presents with decreased independence with mobility due to deficits listed in PT problem list.  Will need SNF level rehab at d/c prior to d/c home.  Will benefit from skilled PT in the acute setting as well to decrease burden of care.    PT Assessment  Patient needs continued PT services    Follow Up Recommendations  Supervision/Assistance - 24 hour;SNF    Does the patient have the potential to tolerate intense rehabilitation    N/A  Barriers to Discharge  None      Equipment Recommendations  None recommended by PT    Recommendations for Other Services   None  Frequency Min 3X/week    Precautions / Restrictions Precautions Precautions: Fall   Pertinent Vitals/Pain C/o pain in feet with ambulation      Mobility  Bed Mobility Overal bed mobility: Needs Assistance Bed Mobility: Sit to Supine;Supine to Sit Supine to sit: Mod assist Sit to supine: Max assist General bed mobility comments: to sit, assist to lift trunk and scoot to edge of bed; to supine assist to lower trunk and lift legs into bed Transfers Overall transfer level: Needs assistance Equipment used: Rolling walker (2 wheeled) Transfers: Sit to/from [facility] Sit to Stand: Mod assist;Max assist Stand pivot transfers: Mod assist General transfer comment: increased time and cues for technique to prevent anterior loss of balance, cues to step to bsc due to difficulty progressing left  LE Ambulation/Gait Ambulation/Gait assistance: Mod assist;Min assist Ambulation Distance (Feet): 10 Feet (x 2) Assistive device: Rolling walker (2 wheeled) Gait Pattern/deviations: Trunk flexed;Step-to pattern;Shuffle;Decreased stride length General Gait Details: assist for proximity to walker, for posture, balance and safety; second trial wtih less assist due to shorter walker and had shoes on    Exercises     PT Diagnosis: Abnormality of gait;Altered mental status;Generalized weakness  PT Problem List: Decreased strength;Decreased mobility;Decreased safety awareness;Decreased activity tolerance;Decreased balance;Decreased knowledge of use of DME;Decreased cognition PT Treatment Interventions: DME instruction;Therapeutic exercise;Gait training;Balance training;Functional mobility training;Therapeutic activities;Patient/family education     PT Goals(Current goals can be found in the care plan section) Acute Rehab PT Goals Patient Stated Goal: To go to rehab, then home PT Goal Formulation: With patient/family Time For Goal Achievement: 06/07/13 Potential to Achieve Goals: Good  Visit Information  Last PT Received On: 05/24/13 Assistance Needed: +1 History of Present Illness: [name] is a 63 y.o. female  Past medical history of end-stage renal disease dialyzes Tuesday Thursdays and Saturdays, diabetes hypertension and coronary artery disease that was brought to the hospital due to confusion, missed dialysis.       Prior [place] expects to be discharged to:: Private residence Living Arrangements: Spouse/significant other Available Help at Discharge: Family Type of Home: Apartment Home Access: Level entry Home Layout: One [place]: Environmental consultant - 2 wheels;Wheelchair - manual Prior Function Level of Independence: Independent with assistive device(s) Comments: took van to dialysis t, th, sat Communication Communication: No difficulties     Cognition  Cognition Arousal/Alertness: Awake/alert Behavior  During Therapy: Agitated Overall Cognitive Status: Impaired/Different from baseline Area of Impairment: Orientation;Attention;Following commands;Safety/judgement;Problem solving Orientation Level: Disoriented to;Time Current Attention Level: Sustained Following Commands: Follows one step commands with increased time Safety/Judgement: Decreased awareness of safety Problem Solving: Slow processing;Decreased initiation    Extremity/Trunk Assessment Lower Extremity Assessment Lower Extremity Assessment: Generalized weakness Cervical / Trunk Assessment Cervical / Trunk Assessment: Kyphotic   Balance Balance Overall balance assessment: Needs assistance Sitting balance-Leahy Scale: Poor Sitting balance - Comments: leaning anteriorly on BSC Standing balance-Leahy Scale: Poor Standing balance comment: flexed posture UE and min assist needed for safety  End of Session PT - End of Session Equipment Utilized During Treatment: Gait belt Activity Tolerance: Patient limited by fatigue Patient left: in bed;with call bell/phone within reach;with family/visitor present  GP     [facility] 05/24/2013, 4:19 PM Magda Kiel, [place] 05/24/2013

## 2013-05-24 NOTE — Progress Notes (Signed)
[name] Progress Note  Subjective:   No appetite; denies pain. Lives with a friend  Objective Filed Vitals:   05/23/13 1704 05/23/13 2145 05/24/13 0552 05/24/13 0736  BP: 143/64 117/55 125/52 147/57  Pulse: 96 94 87 74  Temp: 98.9 F (37.2 C) 99.4 F (37.4 C) 98.1 F (36.7 C) 98.9 F (37.2 C)  TempSrc: Oral Oral Oral Oral  Resp: 18 18 18 18   Height:      Weight:  59.1 kg (130 lb 4.7 oz)    SpO2: 94% 96% 98% 98%   Physical Exam General: NAD just finished bath Heart: RRR Lungs: few crackles at base Abdomen: soft NT Extremities: no LE edema Dialysis Access: left AVGG + bruit  Neuro: [name], 2015, Obama   Dialysis: T[facility] [place]  3h 29min 59.5kg 2K/2.0 Bath Heparin 6000 L arm AVG  Hectorol 2 Epo 1200 Venofer none  Recent lab: Hb 11.1 tsat 59% ferritin 1192 phos 6.8 pth 331   Assessment/Plan:  1 AMS- appears to be back to baseline 2 HCAP/ leukocytosis- on vanc/zosyn  2 ESRD TTS - HD Saturday orders written 3 HTN/volume- 4 BP meds listed as "home meds" but family says she is not taking that much BP medication, one maybe two BP meds at home. All meds stopped for now - to resume as BP comes up; control BP with volume removal for now - slightly below edw now 4 2HPT- phoslo/hectorol were held for slightly high Ca, may need nonCa binder - stop calcitriol; resume hectorol at 1 and resume phsolo 1 ac for now and follow labs  5 Anemia- Hgb 11.1>10.9 stable holding esa for now  6 DM on insulin  7. Nutrition - add multivit and nepro between meals  Myriam Jacobson, PA-C [place] [phone] 05/24/2013,10:01 AM  LOS: 6 days   I have seen and examined patient, discussed with PA and agree with assessment and plan as outlined above. Kelly Splinter MD pager [phone]    cell [phone] 05/24/2013, 1:28 PM    Additional Objective Labs: Basic Metabolic Panel:  Recent Labs Lab 05/18/13 1718 05/21/13 0701 05/23/13 0405  NA 142 140  134*  K 4.8 4.0 3.7  CL 99 93* 87*  CO2 21 21 22   GLUCOSE 183* 108* 308*  BUN 75* 76* 62*  CREATININE 11.43* 11.51* 8.63*  CALCIUM 9.6 9.7 9.4  PHOS 6.1* 7.4*  --    Liver Function Tests:  Recent Labs Lab 05/18/13 1718 05/21/13 0701  AST 8  --   ALT 8  --   ALKPHOS 129*  --   BILITOT 0.2*  --   PROT 7.7  --   ALBUMIN 3.0* 3.2*   CBC:  Recent Labs Lab 05/18/13 1718 05/19/13 0822 05/21/13 0700 05/22/13 0750 05/23/13 0405 05/24/13 0358  WBC 6.9 13.0* 23.2* 25.0* 21.2* 22.4*  NEUTROABS 4.4  --   --   --   --   --   HGB 11.3* 12.9 11.6* 10.9* 11.1* 10.9*  HCT 32.6* 37.1 33.3* 31.0* 32.0* 32.0*  MCV 89.8 89.4 89.0 89.1 89.4 90.1  PLT 263 285 275 295 319 415*   Blood Culture    Component Value Date/Time   SDES BLOOD RIGHT HAND 05/22/2013 0755   SPECREQUEST BOTTLES DRAWN AEROBIC ONLY 10CC 05/22/2013 0755   CULT  Value:        BLOOD CULTURE RECEIVED NO GROWTH TO DATE CULTURE WILL BE HELD FOR 5 DAYS BEFORE ISSUING A FINAL NEGATIVE REPORT Performed  at [facility] 05/22/2013 0755   REPTSTATUS PENDING 05/22/2013 0755   CBG:  Recent Labs Lab 05/23/13 1707 05/23/13 2010 05/24/13 0002 05/24/13 0413 05/24/13 0732  GLUCAP 214* 219* 175* 124* 114*  Medications:   . aspirin EC  81 mg Oral Daily  . calcium acetate  667 mg Oral TID WC  . doxercalciferol  1 mcg Intravenous Q T,Th,Sa-HD  . feeding supplement (NEPRO CARB STEADY)  237 mL Oral BID BM  . haloperidol  2 mg Oral Once  . heparin  5,000 Units Subcutaneous 3 times per day  . insulin aspart  0-9 Units Subcutaneous 6 times per day  . insulin glargine  14 Units Subcutaneous QHS  . multivitamin  1 tablet Oral QHS  . piperacillin-tazobactam (ZOSYN)  IV  2.25 g Intravenous Q8H  . sodium chloride  3 mL Intravenous Q12H  . sodium chloride  3 mL Intravenous Q12H  . vancomycin  500 mg Intravenous Q T,Th,Sa-HD

## 2013-05-24 NOTE — Progress Notes (Signed)
NUTRITION FOLLOW-UP  Pt meets criteria for severe MALNUTRITION in the context of chronic illness as evidenced by 12% wt loss x 6 months and severe muscle depletion.  DOCUMENTATION CODES Per approved criteria  -Severe malnutrition in the context of chronic illness   INTERVENTION: Continue Nepro Shake PO BID between meals, husband reports that pt is consuming them. RD to continue to follow nutrition care plan.  NUTRITION DIAGNOSIS: Inadequate oral intake related to AMS as evidenced by limited PO intake.  Goal: Diet advancement.  Met. Intake to meet >90% of estimated nutrition needs.  Monitor:  weight trends, lab trends, I/O's, PO intake, supplement acceptance  ASSESSMENT: PMHx significant for ESRD (TTS), DM, HTN, CAD. Admitted s/p missing HD x 1 week 2/2 lack of transportation. 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  Vascular evaluated pt's foot, found to severe severe PVD on R foot.  Pt advanced to Renal/Carbohydrate Modified Medium diet on 2/24. Pt with minimal intake, poor appetite. Started on Nepro Shakes PO BID between meals yesterday. Boyfriend reports that she is taking the supplements.  AMS improving.  Potassium WNL. Phosphorus is elevated at 7.4.   Height: Ht Readings from Last 1 Encounters:  05/22/13 _0  (1.6 m)    Weight: Wt Readings from Last 1 Encounters:  05/23/13 130 lb 4.7 oz (59.1 kg)  Admit wt 134 lb    BMI:  Body mass index is 23.09 kg/(m^2). Normal weight  Estimated Nutritional Needs: Kcal: 1500 - 1700 Protein: 70 - 80 g Fluid: 1.2 liters  Skin: intact  Diet Order: Diabetic / Renal diet  EDUCATION NEEDS: -No education needs identified at this time   Intake/Output Summary (Last 24 hours) at 05/24/13 1053 Last data filed at 05/24/13 0907  Gross per 24 hour  Intake    697 ml  Output    139 ml  Net    558 ml    Last BM: 2/26  Labs:   Recent Labs Lab 05/18/13 1718 05/21/13 0701 05/23/13 0405  NA 142 140 134*  K 4.8 4.0 3.7   CL 99 93* 87*  CO2 _1 BUN 75* 76* 62*  CREATININE 11.43* 11.51* 8.63*  CALCIUM 9.6 9.7 9.4  MG 2.8*  --   --   PHOS 6.1* 7.4*  --   GLUCOSE 183* 108* 308*    CBG (last 3)   Recent Labs  05/24/13 0002 05/24/13 0413 05/24/13 0732  GLUCAP 175* 124* 114*    Scheduled Meds: . aspirin EC  81 mg Oral Daily  . calcium acetate  667 mg Oral TID WC  . doxercalciferol  1 mcg Intravenous Q T,Th,Sa-HD  . feeding supplement (NEPRO CARB STEADY)  237 mL Oral BID BM  . haloperidol  2 mg Oral Once  . heparin  5,000 Units Subcutaneous 3 times per day  . insulin aspart  0-9 Units Subcutaneous 6 times per day  . insulin glargine  14 Units Subcutaneous QHS  . multivitamin  1 tablet Oral QHS  . piperacillin-tazobactam (ZOSYN)  IV  2.25 g Intravenous Q8H  . sodium chloride  3 mL Intravenous Q12H  . sodium chloride  3 mL Intravenous Q12H  . vancomycin  500 mg Intravenous Q T,Th,Sa-HD    Continuous Infusions:   Inda Coke MS, RD, LDN Inpatient Registered Dietitian Pager: [phone] After-hours pager: [phone]

## 2013-05-24 NOTE — Discharge Instructions (Signed)
Altered Mental Status Altered mental status most often refers to an abnormal change in your responsiveness and awareness. It can affect your speech, thought, mobility, memory, attention span, or alertness. It can range from slight confusion to complete unresponsiveness (coma). Altered mental status can be a sign of a serious underlying medical condition. Rapid evaluation and medical treatment is necessary for patients having an altered mental status. CAUSES   Low blood sugar (hypoglycemia) or diabetes.  Severe loss of body fluids (dehydration) or a body salt (electrolyte) imbalance.  A stroke or other neurologic problem, such as dementia or delirium.  A head injury or tumor.  A drug or alcohol overdose.  Exposure to toxins or poisons.  Depression, anxiety, and stress.  A low oxygen level (hypoxia).  An infection.  Blood loss.  Twitching or shaking (seizure).  Heart problems, such as heart attack or heart rhythm problems (arrhythmias).  A body temperature that is too low or too high (hypothermia or hyperthermia). DIAGNOSIS  A diagnosis is based on your history, symptoms, physical and neurologic examinations, and diagnostic tests. Diagnostic tests may include:  Measurement of your blood pressure, pulse, breathing, and oxygen levels (vital signs).  Blood tests.  Urine tests.  X-ray exams.  A computerized magnetic scan (magnetic resonance imaging, MRI).  A computerized X-ray scan (computed tomography, CT scan). TREATMENT  Treatment will depend on the cause. Treatment may include:  Management of an underlying medical or mental health condition.  Critical care or support in the hospital. [place]   Only take over-the-counter or prescription medicines for pain, discomfort, or fever as directed by your caregiver.  Manage underlying conditions as directed by your caregiver.  Eat a healthy, well-balanced diet to maintain strength.  Join a support group or  prevention program to cope with the condition or trauma that caused the altered mental status. Ask your caregiver to help choose a program that works for you.  Follow up with your caregiver for further examination, therapy, or testing as directed. SEEK MEDICAL CARE IF:   You feel unwell or have chills.  You or your family notice a change in your behavior or your alertness.  You have trouble following your caregiver's treatment plan.  You have questions or concerns. SEEK IMMEDIATE MEDICAL CARE IF:   You have a rapid heartbeat or have chest pain.  You have difficulty breathing.  You have a fever.  You have a headache with a stiff neck.  You cough up blood.  You have blood in your urine or stool.  You have severe agitation or confusion. MAKE SURE YOU:   Understand these instructions.  Will watch your condition.  Will get help right away if you are not doing well or get worse. Document Released: 09/01/2009 Document Revised: 06/06/2011 Document Reviewed: 09/01/2009 [facility] Patient Information 2014 [place]. Pneumonia, Adult Pneumonia is an infection of the lungs.  CAUSES Pneumonia may be caused by bacteria or a virus. Usually, these infections are caused by breathing infectious particles into the lungs (respiratory tract). SYMPTOMS   Cough.  Fever.  Chest pain.  Increased rate of breathing.  Wheezing.  Mucus production. DIAGNOSIS  If you have the common symptoms of pneumonia, your caregiver will typically confirm the diagnosis with a chest X-ray. The X-ray will show an abnormality in the lung (pulmonary infiltrate) if you have pneumonia. Other tests of your blood, urine, or sputum may be done to find the specific cause of your pneumonia. Your caregiver may also do tests (blood gases  or pulse oximetry) to see how well your lungs are working. TREATMENT  Some forms of pneumonia may be spread to other people when you cough or sneeze. You may be asked to wear a  mask before and during your exam. Pneumonia that is caused by bacteria is treated with antibiotic medicine. Pneumonia that is caused by the influenza virus may be treated with an antiviral medicine. Most other viral infections must run their course. These infections will not respond to antibiotics.  PREVENTION A pneumococcal shot (vaccine) is available to prevent a common bacterial cause of pneumonia. This is usually suggested for:  People over 71 years old.  Patients on chemotherapy.  People with chronic lung problems, such as bronchitis or emphysema.  People with immune system problems. If you are over 65 or have a high risk condition, you may receive the pneumococcal vaccine if you have not received it before. In some countries, a routine influenza vaccine is also recommended. This vaccine can help prevent some cases of pneumonia.You may be offered the influenza vaccine as part of your care. If you smoke, it is time to quit. You may receive instructions on how to stop smoking. Your caregiver can provide medicines and counseling to help you quit. HOME CARE INSTRUCTIONS   Cough suppressants may be used if you are losing too much rest. However, coughing protects you by clearing your lungs. You should avoid using cough suppressants if you can.  Your caregiver may have prescribed medicine if he or she thinks your pneumonia is caused by a bacteria or influenza. Finish your medicine even if you start to feel better.  Your caregiver may also prescribe an expectorant. This loosens the mucus to be coughed up.  Only take over-the-counter or prescription medicines for pain, discomfort, or fever as directed by your caregiver.  Do not smoke. Smoking is a common cause of bronchitis and can contribute to pneumonia. If you are a smoker and continue to smoke, your cough may last several weeks after your pneumonia has cleared.  A cold steam vaporizer or humidifier in your room or home may help loosen  mucus.  Coughing is often worse at night. Sleeping in a semi-upright position in a recliner or using a couple pillows under your head will help with this.  Get rest as you feel it is needed. Your body will usually let you know when you need to rest. SEEK IMMEDIATE MEDICAL CARE IF:   Your illness becomes worse. This is especially true if you are elderly or weakened from any other disease.  You cannot control your cough with suppressants and are losing sleep.  You begin coughing up blood.  You develop pain which is getting worse or is uncontrolled with medicines.  You have a fever.  Any of the symptoms which initially brought you in for treatment are getting worse rather than better.  You develop shortness of breath or chest pain. MAKE SURE YOU:   Understand these instructions.  Will watch your condition.  Will get help right away if you are not doing well or get worse. Document Released: 03/14/2005 Document Revised: 06/06/2011 Document Reviewed: 06/03/2010 [facility] Patient Information 2014 [place], [place]. End-Stage Kidney Disease The kidneys are two organs that lie on either side of the spine between the middle of the back and the front of the abdomen. The kidneys:   Remove wastes and extra water from the blood.   Produce important hormones. These help keep bones strong, regulate blood pressure, and help create red  blood cells.   Balance the fluids and chemicals in the blood and tissues. End-stage kidney disease occurs when the kidneys are so damaged that they cannot do their job. When the kidneys cannot do their job, life-threatening problems occur. The body cannot stay clean and strong without the help of the kidneys. In end-stage kidney disease, the kidneys cannot get better.You need a new kidney or treatments to do some of the work healthy kidneys do in order to stay alive. CAUSES  End-stage kidney disease usually occurs when a long-lasting (chronic) kidney disease gets  worse. It may also occur after the kidneys are suddenly damaged (acute kidney injury).  SYMPTOMS   Swelling (edema) of the legs, ankles, or feet.   Tiredness (lethargy).   Nausea or vomiting.   Confusion.   Problems with urination, such as:   Decreased urine production.   Frequent urination, especially at night.   Frequent accidents in children who are potty trained.   Muscle twitches and cramps.   Persistent itchiness.   Loss of appetite.   Headaches.   Abnormally dark or light skin.   Numbness in the hands or feet.   Easy bruising.   Frequent hiccups.   Menstruation stops. DIAGNOSIS  Your caregiver will measure your blood pressure and take some tests. These may include:   Urine tests.   Blood tests.   Imaging tests, such as:   An ultrasound exam.   Computed tomography (CT).  A kidney biopsy. TREATMENT  There are two treatments for end-stage kidney disease:   A procedure that removes toxic wastes from the body (dialysis).   Receiving a new kidney (kidney transplant). Both of these treatments have serious risks and consequences. Your caregiver will help you determine which treatment is best for you based on your health, age, and other factors. In addition to having dialysis or a kidney transplant, you may need to take medicines to control high blood pressure (hypertension) and cholesterol and to decrease phosphorus levels in your blood.  HOME CARE INSTRUCTIONS  Follow your prescribed diet.   Only take over-the-counter or prescription medicines as directed by your caregiver.   Do not take any new medicines (prescription, over-the-counter, or nutritional supplements) unless approved by your caregiver. Many medicines can worsen your kidney damage or need to have the dose adjusted.   Keep all follow-up appointments. MAKE SURE YOU:  Understand these instructions.  Will watch your condition.  Will get help right away if you are  not doing well or get worse Document Released: 06/04/2003 Document Revised: 02/29/2012 Document Reviewed: 11/11/2011 [facility] [place] [facility] Patient Information 2014 [place].

## 2013-05-24 NOTE — Progress Notes (Signed)
[facility]                                                                              Patient Demographics  [name], is a 63 y.o. female, DOB - 06-12-1950, [id number]  Admit date - 05/18/2013   Admitting Physician Verlee Monte, MD  Outpatient Primary MD for the patient is HODGES,BETH, MD  LOS - 6   No chief complaint on file.       Assessment & Plan   Metabolic encephalopathy/ Uremic encephalopathy:  -Improving, patient oriented to self however not time -Continue vancomycin and Zosyn -Patient remained afebrile, chest x-ray shows no active cardiopulmonary disease -Continue Haldol as needed -Will continue to monitor patient -Repeat blood cultures (05/22/13) show no growth   HCAP -CXR 05/19/13 shows infiltrate in left mid zone -Continue vancomycin and Zosyn -Leukocytosis trending downward  Type II or unspecified type diabetes mellitus  -Continue ISS, Lantus with CBG monitoring  End stage renal disease requiring hemodialysis -Nephrology consulted and following -Continue calcitriol  Hypertension -Controlled, BP meds discontinued by nephrology -Patient's family feels that her blood pressure medications are causing her altered mental status -Will restart blood pressure medications as needed -May have been elevated to altered mental status, psychosis  Right foot ischemic changes:  -Vascular surgery consulted, ABI (0.37) showed severe PVD on the right. -Follow up with Dr. Oneida Alar as an outpatient in 2 weeks  Deconditioning -PT consulted for eval and treatment, recommended SNF or CIR  Code Status: Full  Family Communication: Friend at bedside.  Disposition Plan: Admitted.  Pending CIR evaluation or SNF placement.  Time Spent in minutes  20 minutes  Procedures  ABI Right = severe arterial disease. Left = moderate arterial disease  Consults   Nephrology Vascular surgery  DVT Prophylaxis  Heparin  Lab Results  Component Value Date   PLT 415*  05/24/2013    Medications  Scheduled Meds: . aspirin EC  81 mg Oral Daily  . calcium acetate  667 mg Oral TID WC  . doxercalciferol  1 mcg Intravenous Q T,Th,Sa-HD  . feeding supplement (NEPRO CARB STEADY)  237 mL Oral BID BM  . haloperidol  2 mg Oral Once  . heparin  5,000 Units Subcutaneous 3 times per day  . insulin aspart  0-9 Units Subcutaneous 6 times per day  . insulin glargine  14 Units Subcutaneous QHS  . multivitamin  1 tablet Oral QHS  . piperacillin-tazobactam (ZOSYN)  IV  2.25 g Intravenous Q8H  . sodium chloride  3 mL Intravenous Q12H  . sodium chloride  3 mL Intravenous Q12H  . vancomycin  500 mg Intravenous Q T,Th,Sa-HD   Continuous Infusions:  PRN Meds:.sodium chloride, acetaminophen, acetaminophen, haloperidol lactate, hydrALAZINE, ondansetron (ZOFRAN) IV, ondansetron, polyethylene glycol, sodium chloride  Antibiotics    Anti-infectives   Start     Dose/Rate Route Frequency Ordered Stop   05/25/13 0000  levofloxacin (LEVAQUIN) 500 MG tablet    Comments:  Patient received antibiotics in the hospital. Patient needs one more dose for completion of course.   500 mg Oral Daily 05/24/13 1235     05/21/13 1200  vancomycin (VANCOCIN) 500 mg in sodium chloride 0.9 % 100  mL IVPB     500 mg 100 mL/hr over 60 Minutes Intravenous Every T-Th-Sa (Hemodialysis) 05/20/13 1828     05/20/13 1930  vancomycin (VANCOCIN) 1,250 mg in sodium chloride 0.9 % 250 mL IVPB     1,250 mg 166.7 mL/hr over 90 Minutes Intravenous  Once 05/20/13 1828 05/20/13 2246   05/20/13 1800  piperacillin-tazobactam (ZOSYN) IVPB 2.25 g     2.25 g 100 mL/hr over 30 Minutes Intravenous Every 8 hours 05/20/13 1733          Subjective:   [employer] seen and examined today.  Patient has no complaints this morning.  Patient is more alert today and seems to be back at baseline.  Objective:   Filed Vitals:   05/23/13 2145 05/24/13 0552 05/24/13 0736 05/24/13 1422  BP: 117/55 125/52 147/57 137/54    Pulse: 94 87 74 90  Temp: 99.4 F (37.4 C) 98.1 F (36.7 C) 98.9 F (37.2 C) 98.5 F (36.9 C)  TempSrc: Oral Oral Oral Oral  Resp: 18 18 18 18   Height:      Weight: 59.1 kg (130 lb 4.7 oz)     SpO2: 96% 98% 98% 100%    Wt Readings from Last 3 Encounters:  05/23/13 59.1 kg (130 lb 4.7 oz)  11/02/12 68.266 kg (150 lb 8 oz)  09/14/12 65.772 kg (145 lb)     Intake/Output Summary (Last 24 hours) at 05/24/13 1605 Last data filed at 05/24/13 1400  Gross per 24 hour  Intake   1734 ml  Output      0 ml  Net   1734 ml    Exam  General: Well developed, well nourished, NAD, appears stated age  19: NCAT, mucous membranes moist.  Neck: Supple, no JVD, no masses  Cardiovascular: S1 S2 auscultated. Regular rate and rhythm.  Respiratory: Clear to auscultation bilaterally with equal chest rise  Abdomen: Soft, nontender, nondistended, + bowel sounds  Extremities: warm dry without cyanosis clubbing or edema  Neuro: AAOX3,  no focal deficits   Skin: Without rashes exudates or nodules  Psych: Appropriate mood and affect  Data Review   Micro Results Recent Results (from the past 240 hour(s))  MRSA PCR SCREENING     Status: None   Collection Time    05/19/13  4:54 AM      Result Value Ref Range Status   MRSA by PCR NEGATIVE  NEGATIVE Final   Comment:            The GeneXpert MRSA Assay (FDA     approved for NASAL specimens     only), is one component of a     comprehensive MRSA colonization     surveillance program. It is not     intended to diagnose MRSA     infection nor to guide or     monitor treatment for     MRSA infections.  CULTURE, BLOOD (ROUTINE X 2)     Status: None   Collection Time    05/19/13  2:05 PM      Result Value Ref Range Status   Specimen Description BLOOD RIGHT ANTECUBITAL   Final   Special Requests BOTTLES DRAWN AEROBIC ONLY 10CC   Final   Culture  Setup Time     Final   Value: 05/19/2013 18:50     Performed at [facility]    Culture     Final   Value: VIRIDANS STREPTOCOCCUS     Note: Gram  Stain Report Called to,Read Back By and Verified With: ANDY B @ [phone] 05/20/13 BY KRAWS     Performed at [facility]   Report Status 05/22/2013 FINAL   Final  CULTURE, BLOOD (ROUTINE X 2)     Status: None   Collection Time    05/19/13  2:15 PM      Result Value Ref Range Status   Specimen Description BLOOD RIGHT HAND   Final   Special Requests BOTTLES DRAWN AEROBIC ONLY 10CC   Final   Culture  Setup Time     Final   Value: 05/19/2013 18:50     Performed at [facility]   Culture     Final   Value:        BLOOD CULTURE RECEIVED NO GROWTH TO DATE CULTURE WILL BE HELD FOR 5 DAYS BEFORE ISSUING A FINAL NEGATIVE REPORT     Performed at [facility]   Report Status PENDING   Incomplete  CULTURE, BLOOD (ROUTINE X 2)     Status: None   Collection Time    05/22/13  7:50 AM      Result Value Ref Range Status   Specimen Description BLOOD RIGHT ANTECUBITAL   Final   Special Requests BOTTLES DRAWN AEROBIC ONLY [facility]   Final   Culture  Setup Time     Final   Value: 05/22/2013 12:40     Performed at [facility]   Culture     Final   Value:        BLOOD CULTURE RECEIVED NO GROWTH TO DATE CULTURE WILL BE HELD FOR 5 DAYS BEFORE ISSUING A FINAL NEGATIVE REPORT     Performed at [facility]   Report Status PENDING   Incomplete  CULTURE, BLOOD (ROUTINE X 2)     Status: None   Collection Time    05/22/13  7:55 AM      Result Value Ref Range Status   Specimen Description BLOOD RIGHT HAND   Final   Special Requests BOTTLES DRAWN AEROBIC ONLY 10CC   Final   Culture  Setup Time     Final   Value: 05/22/2013 12:40     Performed at [facility]   Culture     Final   Value:        BLOOD CULTURE RECEIVED NO GROWTH TO DATE CULTURE WILL BE HELD FOR 5 DAYS BEFORE ISSUING A FINAL NEGATIVE REPORT     Performed at [facility]   Report Status PENDING   Incomplete    Radiology Reports Dg  Chest 1 View  05/19/2013   CLINICAL DATA:  Confusion.  EXAM: CHEST - 1 VIEW  COMPARISON:  05/18/2013 and 08/06/2012  FINDINGS: There is a small patchy area of density in the left midzone. The lungs are otherwise clear. Heart size and vascularity are normal. No osseous abnormality.  IMPRESSION: Small new area of atelectasis/ infiltrate in the left midzone.   Electronically Signed   By: Geanie Cooley M.D.   On: 05/19/2013 20:41   Ct Head Wo Contrast  05/19/2013   CLINICAL DATA:  Confusion.  Dialysis patient  EXAM: CT HEAD WITHOUT CONTRAST  TECHNIQUE: Contiguous axial images were obtained from the base of the skull through the vertex without intravenous contrast.  COMPARISON:  CT 05/18/2013  FINDINGS: Generalized atrophy. Moderate to advanced chronic microvascular ischemic changes in the white matter. Chronic ischemia in the thalamus bilaterally. These findings are stable.  Negative for  acute infarct.  Negative for acute hemorrhage or mass.  Mucosal edema in the paranasal sinuses. Air-fluid level left maxillary sinus. No acute skull abnormality. Atherosclerotic disease.  IMPRESSION: Atrophy and chronic microvascular ischemia. No acute intracranial abnormality.  Sinusitis with air-fluid level.   Electronically Signed   By: Franchot Gallo M.D.   On: 05/19/2013 12:53   Dg Chest Port 1 View  05/21/2013   CLINICAL DATA:  63 year old female with shortness of breath.  EXAM: PORTABLE CHEST - 1 VIEW  COMPARISON:  05/19/2013 and prior chest radiographs  FINDINGS: Cardiomegaly identified.  There is no evidence of focal airspace disease, pulmonary edema, suspicious pulmonary nodule/mass, pleural effusion, or pneumothorax. No acute bony abnormalities are identified.  IMPRESSION: Cardiomegaly without evidence of active cardiopulmonary disease.   Electronically Signed   By: Hassan Rowan M.D.   On: 05/21/2013 21:00    CBC  Recent Labs Lab 05/18/13 1718 05/19/13 0822 05/21/13 0700 05/22/13 0750 05/23/13 0405 05/24/13 0358   WBC 6.9 13.0* 23.2* 25.0* 21.2* 22.4*  HGB 11.3* 12.9 11.6* 10.9* 11.1* 10.9*  HCT 32.6* 37.1 33.3* 31.0* 32.0* 32.0*  PLT 263 285 275 295 319 415*  MCV 89.8 89.4 89.0 89.1 89.4 90.1  MCH 31.1 31.1 31.0 31.3 31.0 30.7  MCHC 34.7 34.8 34.8 35.2 34.7 34.1  RDW 14.2 14.2 14.1 14.1 14.2 14.0  LYMPHSABS 2.0  --   --   --   --   --   MONOABS 0.4  --   --   --   --   --   EOSABS 0.1  --   --   --   --   --   BASOSABS 0.0  --   --   --   --   --     Chemistries   Recent Labs Lab 05/18/13 1718 05/21/13 0701 05/23/13 0405  NA 142 140 134*  K 4.8 4.0 3.7  CL 99 93* 87*  CO2 21 21 22   GLUCOSE 183* 108* 308*  BUN 75* 76* 62*  CREATININE 11.43* 11.51* 8.63*  CALCIUM 9.6 9.7 9.4  MG 2.8*  --   --   AST 8  --   --   ALT 8  --   --   ALKPHOS 129*  --   --   BILITOT 0.2*  --   --    ------------------------------------------------------------------------------------------------------------------ estimated creatinine clearance is 5.7 ml/min (by C-G formula based on Cr of 8.63). ------------------------------------------------------------------------------------------------------------------ No results found for this basename: HGBA1C,  in the last 72 hours ------------------------------------------------------------------------------------------------------------------ No results found for this basename: CHOL, HDL, LDLCALC, TRIG, CHOLHDL, LDLDIRECT,  in the last 72 hours ------------------------------------------------------------------------------------------------------------------ No results found for this basename: TSH, T4TOTAL, FREET3, T3FREE, THYROIDAB,  in the last 72 hours ------------------------------------------------------------------------------------------------------------------ No results found for this basename: VITAMINB12, FOLATE, FERRITIN, TIBC, IRON, RETICCTPCT,  in the last 72 hours  Coagulation profile No results found for this basename: INR, PROTIME,  in the last  168 hours  No results found for this basename: DDIMER,  in the last 72 hours  Cardiac Enzymes No results found for this basename: CK, CKMB, TROPONINI, MYOGLOBIN,  in the last 168 hours ------------------------------------------------------------------------------------------------------------------ No components found with this basename: POCBNP,     Lulie Hurd D.O. on 05/24/2013 at 4:05 PM  Between 7am to 7pm - Pager - [phone]  After 7pm go to [email or web address] - password [email or web address]  And look for the night coverage person covering for me after hours  [facility] Office  [phone]

## 2013-05-24 NOTE — Discharge Summary (Addendum)
Physician Discharge Summary  [name] [id number] DOB: 03-22-1951 DOA: 05/18/2013  PCP: Kelly Collie, MD  Admit date: 05/18/2013 Discharge date: 05/27/2013  Time spent: 35 minutes  Recommendations for Outpatient Follow-up:  Patient will be discharged to a nursing home for rehabilitation. She should continue therapy as recommended by the rehabilitation center. She will need to follow up with her primary care within one week of discharge, as well as Dr. Oneida Alar within 2 weeks of discharge. She should continue her medications as prescribed.  Discharge Diagnoses:  Principal Problem: Metabolic encephalopathy/uremic encephalopathy HCAP Type 2 diabetes mellitus End-stage renal disease requiring hemodialysis Hypertension Right foot ischemic changes  Discharge Condition: Stable  Diet recommendation: renal/carb modified diet, 1200 mL fluid restrictionper day  [facility] Weights   05/25/13 1242 05/25/13 2010 05/26/13 2004  Weight: 57.4 kg (126 lb 8.7 oz) 58.2 kg (128 lb 4.9 oz) 57.7 kg (127 lb 3.3 oz)    History of present illness:  [name] is a 63 y.o. female  Past medical history of end-stage renal disease dialyzes Tuesday Thursdays and Saturdays, diabetes hypertension and coronary artery disease that was brought to the hospital due to confusion. As per granddaughter as patient cannot give a history she has not had dialysis for over a week as she couldn't get transportation. She was found the day prior to admission confused but when the granddaughter went to the house today she incoherent and not making any sense they took her to [facility]. As per granddaughter the patient had been complaining of nausea and vomiting 3 days prior to admission. Her vitals were 98% on 2 L respirations 20 heart rate 77 blood pressure 154/68 potassium 4.2 BUN of 70 creatinine of 11.5 and a glucose of 200 so she was transferred to call for further evaluation.   Hospital Course:  Metabolic  encephalopathy/ Uremic encephalopathy:  -Improving, patient appears to be at her baseline -Patient remained afebrile, chest x-ray shows no active cardiopulmonary disease  -Continue Haldol as needed  -Repeat blood cultures (05/22/13) show no growth   HCAP  -CXR 05/19/13 shows infiltrate in left mid zone  -Was paced on  vancomycin and Zosyn and has completed antibiotic course -Leukocytosis trending downward   Type II or unspecified type diabetes mellitus  -Continue ISS with CBG monitoring  -Continue Lantus 14u QHS   End stage renal disease requiring hemodialysis  -Nephrology consulted and followed  -Continue calcitriol   Hypertension  -Controlled, BP meds discontinued by nephrology  -Patient's family feels that her blood pressure medications are causing her altered mental status  -Will restart blood pressure medications as needed  -May have been elevated to altered mental status, psychosis   Right foot ischemic changes:  -Vascular surgery consulted, ABI (0.37) showed severe PVD on the right.  -Follow up with Dr. Oneida Alar as an outpatient in 2 weeks  History of CHF -No echocardiogram in the system. -Currently euvolemic and not in exacerbation.   -unknown as to whether her CHF is systolic or diastolic  Severe Protein Caloric Malnutiriton -In the context of sepsis  -Continue feeding supplementation  Procedures: ABI: Right = severe arterial disease. Left = moderate arterial disease  Consultations: Nephrology Vascular surgery  Discharge Exam: Filed Vitals:   05/27/13 0944  BP: 193/70  Pulse: 69  Temp: 97.5 F (36.4 C)  Resp:    Exam  General: Well developed, well nourished, NAD, appears stated age  HEENT: NCAT, mucous membranes moist.  Neck: Supple, no JVD, no masses  Cardiovascular: S1 S2  auscultated. Regular rate and rhythm.  Respiratory: Clear to auscultation bilaterally with equal chest rise  Abdomen: Soft, nontender, nondistended, + bowel sounds  Extremities:  warm dry without cyanosis clubbing or edema  Neuro: AAOX3, no focal deficits  Skin: Without rashes exudates or nodules  Discharge Instructions      Discharge Orders   Future Appointments Provider Department Dept Phone   06/13/2013 10:45 AM Elam Dutch, MD [facility] [phone]   Future Orders Complete By Expires   Discharge instructions  As directed    Comments:     Patient will be discharged to a nursing home for rehabilitation. She should continue therapy as recommended by the rehabilitation center. She will need to follow up with her primary care within one week of discharge, as well as Dr. Oneida Alar within 2 weeks of discharge. She should continue her medications as prescribed.       Medication List    STOP taking these medications       calcitRIOL 0.25 MCG capsule  Commonly known as:  ROCALTROL     carvedilol 25 MG tablet  Commonly known as:  COREG     furosemide 80 MG tablet  Commonly known as:  LASIX     gabapentin 600 MG tablet  Commonly known as:  NEURONTIN     hydrALAZINE 50 MG tablet  Commonly known as:  APRESOLINE     oxyCODONE 5 MG immediate release tablet  Commonly known as:  ROXICODONE     quinapril 20 MG tablet  Commonly known as:  ACCUPRIL      TAKE these medications       amLODipine 5 MG tablet  Commonly known as:  NORVASC  Take 1 tablet (5 mg total) by mouth daily.     amLODipine 5 MG tablet  Commonly known as:  NORVASC  Take 1 tablet (5 mg total) by mouth at bedtime.  Start taking on:  05/28/2013     aspirin EC 81 MG tablet  Take 81 mg by mouth daily.     calcium acetate 667 MG capsule  Commonly known as:  PHOSLO  Take 1 capsule (667 mg total) by mouth 3 (three) times daily with meals.     LANTUS [place]  Inject 20 Units into the skin at bedtime.     multivitamin Tabs tablet  Take 1 tablet by mouth at bedtime.       No Known Allergies Follow-up Information   Follow up with HODGES,BETH, MD. Schedule  an appointment as soon as possible for a visit in 1 week. [facility] followup)    Specialty:  Family Medicine   Contact information:   [address] [place] [address] [phone]       Follow up with Elam Dutch, MD. Schedule an appointment as soon as possible for a visit in 2 weeks.   Specialty:  Vascular Surgery   Contact information:   [address] [place] [address] [phone]        The results of significant diagnostics from this hospitalization (including imaging, microbiology, ancillary and laboratory) are listed below for reference.    Significant Diagnostic Studies: Dg Chest 1 View  05/19/2013   CLINICAL DATA:  Confusion.  EXAM: CHEST - 1 VIEW  COMPARISON:  05/18/2013 and 08/06/2012  FINDINGS: There is a small patchy area of density in the left midzone. The lungs are otherwise clear. Heart size and vascularity are normal. No osseous abnormality.  IMPRESSION: Small new area of  atelectasis/ infiltrate in the left midzone.   Electronically Signed   By: Geanie Cooley M.D.   On: 05/19/2013 20:41   Ct Head Wo Contrast  05/19/2013   CLINICAL DATA:  Confusion.  Dialysis patient  EXAM: CT HEAD WITHOUT CONTRAST  TECHNIQUE: Contiguous axial images were obtained from the base of the skull through the vertex without intravenous contrast.  COMPARISON:  CT 05/18/2013  FINDINGS: Generalized atrophy. Moderate to advanced chronic microvascular ischemic changes in the white matter. Chronic ischemia in the thalamus bilaterally. These findings are stable.  Negative for acute infarct.  Negative for acute hemorrhage or mass.  Mucosal edema in the paranasal sinuses. Air-fluid level left maxillary sinus. No acute skull abnormality. Atherosclerotic disease.  IMPRESSION: Atrophy and chronic microvascular ischemia. No acute intracranial abnormality.  Sinusitis with air-fluid level.   Electronically Signed   By: Marlan Palau M.D.   On: 05/19/2013 12:53   Dg Chest Port 1 View  05/21/2013    CLINICAL DATA:  63 year old female with shortness of breath.  EXAM: PORTABLE CHEST - 1 VIEW  COMPARISON:  05/19/2013 and prior chest radiographs  FINDINGS: Cardiomegaly identified.  There is no evidence of focal airspace disease, pulmonary edema, suspicious pulmonary nodule/mass, pleural effusion, or pneumothorax. No acute bony abnormalities are identified.  IMPRESSION: Cardiomegaly without evidence of active cardiopulmonary disease.   Electronically Signed   By: Laveda Abbe M.D.   On: 05/21/2013 21:00    Microbiology: Recent Results (from the past 240 hour(s))  MRSA PCR SCREENING     Status: None   Collection Time    05/19/13  4:54 AM      Result Value Ref Range Status   MRSA by PCR NEGATIVE  NEGATIVE Final   Comment:            The GeneXpert MRSA Assay (FDA     approved for NASAL specimens     only), is one component of a     comprehensive MRSA colonization     surveillance program. It is not     intended to diagnose MRSA     infection nor to guide or     monitor treatment for     MRSA infections.  CULTURE, BLOOD (ROUTINE X 2)     Status: None   Collection Time    05/19/13  2:05 PM      Result Value Ref Range Status   Specimen Description BLOOD RIGHT ANTECUBITAL   Final   Special Requests BOTTLES DRAWN AEROBIC ONLY 10CC   Final   Culture  Setup Time     Final   Value: 05/19/2013 18:50     Performed at [facility]   Culture     Final   Value: VIRIDANS STREPTOCOCCUS     Note: Gram Stain Report Called to,Read Back By and Verified With: ANDY B @ [phone] 05/20/13 BY KRAWS     Performed at [facility]   Report Status 05/22/2013 FINAL   Final  CULTURE, BLOOD (ROUTINE X 2)     Status: None   Collection Time    05/19/13  2:15 PM      Result Value Ref Range Status   Specimen Description BLOOD RIGHT HAND   Final   Special Requests BOTTLES DRAWN AEROBIC ONLY 10CC   Final   Culture  Setup Time     Final   Value: 05/19/2013 18:50     Performed at [facility]  Final   Value: NO GROWTH 5 DAYS     Performed at [facility]   Report Status 05/25/2013 FINAL   Final  CULTURE, BLOOD (ROUTINE X 2)     Status: None   Collection Time    05/22/13  7:50 AM      Result Value Ref Range Status   Specimen Description BLOOD RIGHT ANTECUBITAL   Final   Special Requests BOTTLES DRAWN AEROBIC ONLY [facility]   Final   Culture  Setup Time     Final   Value: 05/22/2013 12:40     Performed at [facility]   Culture     Final   Value:        BLOOD CULTURE RECEIVED NO GROWTH TO DATE CULTURE WILL BE HELD FOR 5 DAYS BEFORE ISSUING A FINAL NEGATIVE REPORT     Performed at [facility]   Report Status PENDING   Incomplete  CULTURE, BLOOD (ROUTINE X 2)     Status: None   Collection Time    05/22/13  7:55 AM      Result Value Ref Range Status   Specimen Description BLOOD RIGHT HAND   Final   Special Requests BOTTLES DRAWN AEROBIC ONLY 10CC   Final   Culture  Setup Time     Final   Value: 05/22/2013 12:40     Performed at [facility]   Culture     Final   Value:        BLOOD CULTURE RECEIVED NO GROWTH TO DATE CULTURE WILL BE HELD FOR 5 DAYS BEFORE ISSUING A FINAL NEGATIVE REPORT     Performed at [facility]   Report Status PENDING   Incomplete     Labs: Basic Metabolic Panel:  Recent Labs Lab 05/21/13 0701 05/23/13 0405 05/25/13 0456 05/25/13 0902 05/26/13 0553  NA 140 134* 137 137 144  K 4.0 3.7 3.7 3.3* 4.6  CL 93* 87* 91* 94* 102  CO2 21 22 26 26 26   GLUCOSE 108* 308* 86 132* 130*  BUN 76* 62* 50* 38* 21  CREATININE 11.51* 8.63* 6.99* 5.21* 3.99*  CALCIUM 9.7 9.4 10.1 9.3 9.5  PHOS 7.4*  --   --  3.3  3.3  --    Liver Function Tests:  Recent Labs Lab 05/21/13 0701 05/25/13 0902  ALBUMIN 3.2* 2.4*   No results found for this basename: LIPASE, AMYLASE,  in the last 168 hours No results found for this basename: AMMONIA,  in the last 168 hours CBC:  Recent Labs Lab 05/22/13 0750  05/23/13 0405 05/24/13 0358 05/25/13 0456 05/26/13 0553  WBC 25.0* 21.2* 22.4* 15.1* 11.3*  HGB 10.9* 11.1* 10.9* 11.7* 11.2*  HCT 31.0* 32.0* 32.0* 33.9* 33.5*  MCV 89.1 89.4 90.1 89.4 91.5  PLT 295 319 415* 459* 399   Cardiac Enzymes: No results found for this basename: CKTOTAL, CKMB, CKMBINDEX, TROPONINI,  in the last 168 hours BNP: BNP (last 3 results) No results found for this basename: PROBNP,  in the last 8760 hours CBG:  Recent Labs Lab 05/26/13 1157 05/26/13 1701 05/26/13 1959 05/27/13 0022 05/27/13 0747  GLUCAP 159* 199* 253* 184* 99       Signed:  Chevie Birkhead  [facility] 05/27/2013, 10:13 AM

## 2013-05-25 LAB — CULTURE, BLOOD (ROUTINE X 2): CULTURE: NO GROWTH

## 2013-05-25 LAB — BASIC METABOLIC PANEL
BUN: 50 mg/dL — AB (ref 6–23)
CHLORIDE: 91 meq/L — AB (ref 96–112)
CO2: 26 meq/L (ref 19–32)
CREATININE: 6.99 mg/dL — AB (ref 0.50–1.10)
Calcium: 10.1 mg/dL (ref 8.4–10.5)
GFR calc Af Amer: 7 mL/min — ABNORMAL LOW (ref >90)
GFR calc non Af Amer: 6 mL/min — ABNORMAL LOW (ref >90)
GLUCOSE: 86 mg/dL (ref 70–99)
Potassium: 3.7 mEq/L (ref 3.7–5.3)
Sodium: 137 mEq/L (ref 137–147)

## 2013-05-25 LAB — GLUCOSE, CAPILLARY
GLUCOSE-CAPILLARY: 217 mg/dL — AB (ref 70–99)
GLUCOSE-CAPILLARY: 78 mg/dL (ref 70–99)
Glucose-Capillary: 111 mg/dL — ABNORMAL HIGH (ref 70–99)
Glucose-Capillary: 150 mg/dL — ABNORMAL HIGH (ref 70–99)
Glucose-Capillary: 160 mg/dL — ABNORMAL HIGH (ref 70–99)
Glucose-Capillary: 161 mg/dL — ABNORMAL HIGH (ref 70–99)
Glucose-Capillary: 294 mg/dL — ABNORMAL HIGH (ref 70–99)
Glucose-Capillary: 57 mg/dL — ABNORMAL LOW (ref 70–99)

## 2013-05-25 LAB — CBC
HEMATOCRIT: 33.9 % — AB (ref 36.0–46.0)
Hemoglobin: 11.7 g/dL — ABNORMAL LOW (ref 12.0–15.0)
MCH: 30.9 pg (ref 26.0–34.0)
MCHC: 34.5 g/dL (ref 30.0–36.0)
MCV: 89.4 fL (ref 78.0–100.0)
PLATELETS: 459 10*3/uL — AB (ref 150–400)
RBC: 3.79 MIL/uL — AB (ref 3.87–5.11)
RDW: 13.8 % (ref 11.5–15.5)
WBC: 15.1 10*3/uL — ABNORMAL HIGH (ref 4.0–10.5)

## 2013-05-25 LAB — RENAL FUNCTION PANEL
Albumin: 2.4 g/dL — ABNORMAL LOW (ref 3.5–5.2)
BUN: 38 mg/dL — ABNORMAL HIGH (ref 6–23)
CO2: 26 mEq/L (ref 19–32)
Calcium: 9.3 mg/dL (ref 8.4–10.5)
Chloride: 94 mEq/L — ABNORMAL LOW (ref 96–112)
Creatinine, Ser: 5.21 mg/dL — ABNORMAL HIGH (ref 0.50–1.10)
GFR calc Af Amer: 9 mL/min — ABNORMAL LOW (ref >90)
GFR calc non Af Amer: 8 mL/min — ABNORMAL LOW (ref >90)
Glucose, Bld: 132 mg/dL — ABNORMAL HIGH (ref 70–99)
Phosphorus: 3.3 mg/dL (ref 2.3–4.6)
Potassium: 3.3 mEq/L — ABNORMAL LOW (ref 3.7–5.3)
Sodium: 137 mEq/L (ref 137–147)

## 2013-05-25 LAB — PHOSPHORUS: Phosphorus: 3.3 mg/dL (ref 2.3–4.6)

## 2013-05-25 LAB — PREALBUMIN: Prealbumin: 14 mg/dL — ABNORMAL LOW (ref 17.0–34.0)

## 2013-05-25 MED ORDER — HEPARIN SODIUM (PORCINE) 1000 UNIT/ML DIALYSIS
20.0000 [IU]/kg | INTRAMUSCULAR | Status: DC | PRN
  Administered 2013-05-25: 1200 [IU] via INTRAVENOUS_CENTRAL
  Filled 2013-05-25: qty 2

## 2013-05-25 MED ORDER — LIDOCAINE HCL (PF) 1 % IJ SOLN: 5.0000 mL | INTRAMUSCULAR | Status: DC | PRN

## 2013-05-25 MED ORDER — LIDOCAINE-PRILOCAINE 2.5-2.5 % EX CREA: 1.0000 "application " | TOPICAL_CREAM | CUTANEOUS | Status: DC | PRN

## 2013-05-25 MED ORDER — NEPRO/CARBSTEADY PO LIQD: 237.0000 mL | ORAL | Status: DC | PRN

## 2013-05-25 MED ORDER — PENTAFLUOROPROP-TETRAFLUOROETH EX AERO: 1.0000 "application " | INHALATION_SPRAY | CUTANEOUS | Status: DC | PRN

## 2013-05-25 MED ORDER — HEPARIN SODIUM (PORCINE) 1000 UNIT/ML DIALYSIS
1000.0000 [IU] | INTRAMUSCULAR | Status: DC | PRN
  Filled 2013-05-25: qty 1

## 2013-05-25 MED ORDER — ALTEPLASE 2 MG IJ SOLR
2.0000 mg | Freq: Once | INTRAMUSCULAR | Status: DC | PRN
  Filled 2013-05-25: qty 2

## 2013-05-25 MED ORDER — SODIUM CHLORIDE 0.9 % IV SOLN: 100.0000 mL | INTRAVENOUS | Status: DC | PRN

## 2013-05-25 MED ORDER — DOXERCALCIFEROL 4 MCG/2ML IV SOLN
INTRAVENOUS | Status: AC
  Administered 2013-05-25: 1 ug
  Filled 2013-05-25: qty 2

## 2013-05-25 NOTE — Progress Notes (Signed)
[facility] Progress Note  Subjective:   Too weak to stand up.  "They are going to get me rehab"  Objective Filed Vitals:   05/25/13 0546 05/25/13 0750 05/25/13 0842 05/25/13 0847  BP: 116/52 148/55 134/68 147/66  Pulse: 81 74 73 69  Temp: 97.8 F (36.6 C) 98.2 F (36.8 C)    TempSrc: Oral Oral    Resp: 18 18    Height:      Weight:      SpO2: 96% 97%     Physical Exam goal 1.5 Bed weight 60.3 General: NAD supine on HD Heart: RRR Lungs: no wheezes or rales Abdomen: abd soft NT Extremities: no LE edema Dialysis Access: left AVGG Qb 400   Dialysis: TTS [place]  3h 66min 59.5kg 2K/2.0 Bath Heparin 6000 L arm AVG  Hectorol 2 Epo 1200 Venofer none  Recent lab: Hb 11.1 tsat 59% ferritin 1192 phos 6.8 pth 331   Assessment/Plan:  1 AMS- appears to be back to baseline  2 HCAP/ leukocytosis improved at 15K- on vanc/zosyn  2 ESRD TTS - HD being initiated - K 3.7 - 4 K bath so can't use 2 Ca bath 3 HTN/volume- off all BP medication now, restart as needed. Euvolemic. Would use norvasc first if needed.  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 Nutrition - add multivit and nepro between meals 8. Disp - PT eval rec: SNF for rehab   Myriam Jacobson, PA-C [place] [phone] 05/25/2013,8:50 AM  LOS: 7 days   I have seen and examined patient, discussed with PA and agree with assessment and plan as outlined above with additions as indicated. Kelly Splinter MD pager [phone]    cell [phone] 05/25/2013, 12:40 PM      Additional Objective Labs: Basic Metabolic Panel:  Recent Labs Lab 05/18/13 1718 05/21/13 0701  05/23/13 0405 05/25/13 0456  NA 142 140 134* 137  K 4.8 4.0 3.7 3.7  CL 99 93* 87* 91*  CO2 21 21 22 26   GLUCOSE 183* 108* 308* 86  BUN 75* 76* 62* 50*  CREATININE 11.43* 11.51* 8.63* 6.99*  CALCIUM 9.6 9.7 9.4 10.1  PHOS 6.1* 7.4*  --   --    Liver Function Tests:  Recent Labs Lab 05/18/13 1718 05/21/13 0701  AST 8  --   ALT 8  --   ALKPHOS 129*  --   BILITOT 0.2*  --   PROT 7.7  --   ALBUMIN 3.0* 3.2*   CBC:  Recent Labs Lab 05/18/13 1718  05/21/13 0700 05/22/13 0750 05/23/13 0405 05/24/13 0358 05/25/13 0456  WBC 6.9  < > 23.2* 25.0* 21.2* 22.4* 15.1*  NEUTROABS 4.4  --   --   --   --   --   --   HGB 11.3*  < > 11.6* 10.9* 11.1* 10.9* 11.7*  HCT 32.6*  < > 33.3* 31.0* 32.0* 32.0* 33.9*  MCV 89.8  < > 89.0 89.1 89.4 90.1 89.4  PLT 263  < > 275 295 319 415* 459*  < > = values in this interval not displayed.  Blood Culture    Component Value Date/Time   SDES BLOOD RIGHT HAND 05/22/2013 0755   SPECREQUEST BOTTLES DRAWN AEROBIC ONLY 10CC 05/22/2013 0755   CULT  Value:        BLOOD CULTURE RECEIVED NO GROWTH TO DATE CULTURE WILL BE HELD FOR 5 DAYS BEFORE ISSUING A FINAL NEGATIVE REPORT Performed at [facility] 05/22/2013 0755   REPTSTATUS PENDING 05/22/2013 0755   CBG:  Recent Labs Lab 05/24/13 2046 05/24/13 2355 05/25/13 0404 05/25/13 0442 05/25/13 0748  GLUCAP 217* 161* 57* 78 160*   Medications:   . aspirin EC  81 mg Oral Daily  . calcium acetate  667 mg Oral TID WC  . doxercalciferol  1 mcg Intravenous Q T,Th,Sa-HD  . feeding supplement (NEPRO CARB STEADY)  237 mL Oral BID BM  . haloperidol  2 mg Oral Once  . heparin  5,000 Units Subcutaneous 3 times per day  . insulin aspart  0-9 Units Subcutaneous 6 times per day  . insulin glargine  14 Units Subcutaneous QHS  . multivitamin  1 tablet Oral QHS  . piperacillin-tazobactam (ZOSYN)  IV  2.25 g Intravenous Q8H  . sodium chloride  3 mL Intravenous Q12H  . sodium chloride  3 mL Intravenous  Q12H  . vancomycin  500 mg Intravenous Q T,Th,Sa-HD

## 2013-05-25 NOTE — Progress Notes (Signed)
[facility]                                                                              Patient Demographics  [name], is a 63 y.o. female, DOB - 1950-07-27, MS:[id number]  Admit date - 05/18/2013   Admitting Physician Kelly Monte, MD  Outpatient Primary MD for the patient is HODGES,BETH, MD  LOS - 7   No chief complaint on file.       Assessment & Plan   Metabolic encephalopathy/ Uremic encephalopathy:  -Improving, patient oriented to self and time -Continue vancomycin and Zosyn -Patient remained afebrile, chest x-ray shows no active cardiopulmonary disease -Continue Haldol as needed -Will continue to monitor patient -Repeat blood cultures (05/22/13) show no growth   HCAP -CXR 05/19/13 shows infiltrate in left mid zone -Continue vancomycin and Zosyn -Leukocytosis trending downward  Type II or unspecified type diabetes mellitus  -Continue ISS, Lantus with CBG monitoring  End stage renal disease requiring hemodialysis -Nephrology consulted and following -Continue calcitriol  Hypertension -Controlled, BP meds discontinued by nephrology -Patient's family feels that her blood pressure medications are causing her altered mental status -Will restart blood pressure medications as needed -May have been elevated to altered mental status, psychosis  Right foot ischemic changes:  -Vascular surgery consulted, ABI (0.37) showed severe PVD on the right. -Follow up with Dr. Oneida Alar as an outpatient in 2 weeks  Deconditioning -PT consulted for eval and treatment, recommended SNF or CIR  Code Status: Full  Family Communication: Friend at bedside.  Disposition Plan: Admitted.  Pending CIR evaluation or SNF placement.  Time Spent in minutes  20 minutes  Procedures  ABI Right = severe arterial disease. Left = moderate arterial disease  Consults   Nephrology Vascular surgery  DVT Prophylaxis  Heparin  Lab Results  Component Value Date   PLT 459*  05/25/2013    Medications  Scheduled Meds: . aspirin EC  81 mg Oral Daily  . calcium acetate  667 mg Oral TID WC  . doxercalciferol  1 mcg Intravenous Q T,Th,Sa-HD  . feeding supplement (NEPRO CARB STEADY)  237 mL Oral BID BM  . haloperidol  2 mg Oral Once  . heparin  5,000 Units Subcutaneous 3 times per day  . insulin aspart  0-9 Units Subcutaneous 6 times per day  . insulin glargine  14 Units Subcutaneous QHS  . multivitamin  1 tablet Oral QHS  . piperacillin-tazobactam (ZOSYN)  IV  2.25 g Intravenous Q8H  . sodium chloride  3 mL Intravenous Q12H  . sodium chloride  3 mL Intravenous Q12H  . vancomycin  500 mg Intravenous Q T,Th,Sa-HD   Continuous Infusions:  PRN Meds:.sodium chloride, sodium chloride, sodium chloride, acetaminophen, acetaminophen, alteplase, feeding supplement (NEPRO CARB STEADY), haloperidol lactate, heparin, heparin, hydrALAZINE, lidocaine (PF), lidocaine-prilocaine, ondansetron (ZOFRAN) IV, ondansetron, pentafluoroprop-tetrafluoroeth, polyethylene glycol, sodium chloride  Antibiotics    Anti-infectives   Start     Dose/Rate Route Frequency Ordered Stop   05/25/13 0000  levofloxacin (LEVAQUIN) 500 MG tablet    Comments:  Patient received antibiotics in the hospital. Patient needs one more dose for completion of course.   500 mg Oral Daily 05/24/13 1235  05/21/13 1200  vancomycin (VANCOCIN) 500 mg in sodium chloride 0.9 % 100 mL IVPB     500 mg 100 mL/hr over 60 Minutes Intravenous Every T-Th-Sa (Hemodialysis) 05/20/13 1828     05/20/13 1930  vancomycin (VANCOCIN) 1,250 mg in sodium chloride 0.9 % 250 mL IVPB     1,250 mg 166.7 mL/hr over 90 Minutes Intravenous  Once 05/20/13 1828 05/20/13 2246   05/20/13 1800  piperacillin-tazobactam (ZOSYN) IVPB 2.25 g     2.25 g 100 mL/hr over 30 Minutes Intravenous Every 8 hours 05/20/13 1733          Subjective:   [name] seen and examined today in dialysis.  Patient has no complaints this morning.   Patient is more alert today and seems to be back at baseline.  Objective:   Filed Vitals:   05/25/13 0820 05/25/13 0842 05/25/13 0847 05/25/13 0900  BP: 155/66 134/68 147/66 151/69  Pulse: 73 73 69 70  Temp: 97.9 F (36.6 C)     TempSrc: Oral     Resp: 19     Height:      Weight: 58.4 kg (128 lb 12 oz)     SpO2: 99%       Wt Readings from Last 3 Encounters:  05/25/13 58.4 kg (128 lb 12 oz)  11/02/12 68.266 kg (150 lb 8 oz)  09/14/12 65.772 kg (145 lb)     Intake/Output Summary (Last 24 hours) at 05/25/13 [id number] Last data filed at 05/25/13 0700  Gross per 24 hour  Intake   1557 ml  Output      0 ml  Net   1557 ml    Exam  General: Well developed, well nourished, NAD, appears stated age  79: NCAT, mucous membranes moist.  Neck: Supple, no JVD, no masses  Cardiovascular: S1 S2 auscultated. Regular rate and rhythm.  Respiratory: Clear to auscultation bilaterally with equal chest rise  Abdomen: Soft, nontender, nondistended, + bowel sounds  Extremities: warm dry without cyanosis clubbing or edema  Neuro: AAOX3,  no focal deficits   Skin: Without rashes exudates or nodules  Psych: Appropriate mood and affect  Data Review   Micro Results Recent Results (from the past 240 hour(s))  MRSA PCR SCREENING     Status: None   Collection Time    05/19/13  4:54 AM      Result Value Ref Range Status   MRSA by PCR NEGATIVE  NEGATIVE Final   Comment:            The GeneXpert MRSA Assay (FDA     approved for NASAL specimens     only), is one component of a     comprehensive MRSA colonization     surveillance program. It is not     intended to diagnose MRSA     infection nor to guide or     monitor treatment for     MRSA infections.  CULTURE, BLOOD (ROUTINE X 2)     Status: None   Collection Time    05/19/13  2:05 PM      Result Value Ref Range Status   Specimen Description BLOOD RIGHT ANTECUBITAL   Final   Special Requests BOTTLES DRAWN AEROBIC ONLY 10CC    Final   Culture  Setup Time     Final   Value: 05/19/2013 18:50     Performed at [facility]   Culture     Final   Value: VIRIDANS STREPTOCOCCUS  Note: Gram Stain Report Called to,Read Back By and Verified With: ANDY B @ [phone] 05/20/13 BY KRAWS     Performed at [facility]   Report Status 05/22/2013 FINAL   Final  CULTURE, BLOOD (ROUTINE X 2)     Status: None   Collection Time    05/19/13  2:15 PM      Result Value Ref Range Status   Specimen Description BLOOD RIGHT HAND   Final   Special Requests BOTTLES DRAWN AEROBIC ONLY 10CC   Final   Culture  Setup Time     Final   Value: 05/19/2013 18:50     Performed at [facility]   Culture     Final   Value:        BLOOD CULTURE RECEIVED NO GROWTH TO DATE CULTURE WILL BE HELD FOR 5 DAYS BEFORE ISSUING A FINAL NEGATIVE REPORT     Performed at [facility]   Report Status PENDING   Incomplete  CULTURE, BLOOD (ROUTINE X 2)     Status: None   Collection Time    05/22/13  7:50 AM      Result Value Ref Range Status   Specimen Description BLOOD RIGHT ANTECUBITAL   Final   Special Requests BOTTLES DRAWN AEROBIC ONLY [facility]   Final   Culture  Setup Time     Final   Value: 05/22/2013 12:40     Performed at [facility]   Culture     Final   Value:        BLOOD CULTURE RECEIVED NO GROWTH TO DATE CULTURE WILL BE HELD FOR 5 DAYS BEFORE ISSUING A FINAL NEGATIVE REPORT     Performed at [facility]   Report Status PENDING   Incomplete  CULTURE, BLOOD (ROUTINE X 2)     Status: None   Collection Time    05/22/13  7:55 AM      Result Value Ref Range Status   Specimen Description BLOOD RIGHT HAND   Final   Special Requests BOTTLES DRAWN AEROBIC ONLY 10CC   Final   Culture  Setup Time     Final   Value: 05/22/2013 12:40     Performed at [facility]   Culture     Final   Value:        BLOOD CULTURE RECEIVED NO GROWTH TO DATE CULTURE WILL BE HELD FOR 5 DAYS BEFORE ISSUING A FINAL NEGATIVE  REPORT     Performed at [facility]   Report Status PENDING   Incomplete    Radiology Reports Dg Chest 1 View  05/19/2013   CLINICAL DATA:  Confusion.  EXAM: CHEST - 1 VIEW  COMPARISON:  05/18/2013 and 08/06/2012  FINDINGS: There is a small patchy area of density in the left midzone. The lungs are otherwise clear. Heart size and vascularity are normal. No osseous abnormality.  IMPRESSION: Small new area of atelectasis/ infiltrate in the left midzone.   Electronically Signed   By: Rozetta Nunnery M.D.   On: 05/19/2013 20:41   Ct Head Wo Contrast  05/19/2013   CLINICAL DATA:  Confusion.  Dialysis patient  EXAM: CT HEAD WITHOUT CONTRAST  TECHNIQUE: Contiguous axial images were obtained from the base of the skull through the vertex without intravenous contrast.  COMPARISON:  CT 05/18/2013  FINDINGS: Generalized atrophy. Moderate to advanced chronic microvascular ischemic changes in the white matter. Chronic ischemia in the thalamus bilaterally. These findings are stable.  Negative for acute infarct.  Negative for acute hemorrhage or mass.  Mucosal edema in the paranasal sinuses. Air-fluid level left maxillary sinus. No acute skull abnormality. Atherosclerotic disease.  IMPRESSION: Atrophy and chronic microvascular ischemia. No acute intracranial abnormality.  Sinusitis with air-fluid level.   Electronically Signed   By: Marlan Palau M.D.   On: 05/19/2013 12:53   Dg Chest Port 1 View  05/21/2013   CLINICAL DATA:  63 year old female with shortness of breath.  EXAM: PORTABLE CHEST - 1 VIEW  COMPARISON:  05/19/2013 and prior chest radiographs  FINDINGS: Cardiomegaly identified.  There is no evidence of focal airspace disease, pulmonary edema, suspicious pulmonary nodule/mass, pleural effusion, or pneumothorax. No acute bony abnormalities are identified.  IMPRESSION: Cardiomegaly without evidence of active cardiopulmonary disease.   Electronically Signed   By: Laveda Abbe M.D.   On: 05/21/2013 21:00     CBC  Recent Labs Lab 05/18/13 1718  05/21/13 0700 05/22/13 0750 05/23/13 0405 05/24/13 0358 05/25/13 0456  WBC 6.9  < > 23.2* 25.0* 21.2* 22.4* 15.1*  HGB 11.3*  < > 11.6* 10.9* 11.1* 10.9* 11.7*  HCT 32.6*  < > 33.3* 31.0* 32.0* 32.0* 33.9*  PLT 263  < > 275 295 319 415* 459*  MCV 89.8  < > 89.0 89.1 89.4 90.1 89.4  MCH 31.1  < > 31.0 31.3 31.0 30.7 30.9  MCHC 34.7  < > 34.8 35.2 34.7 34.1 34.5  RDW 14.2  < > 14.1 14.1 14.2 14.0 13.8  LYMPHSABS 2.0  --   --   --   --   --   --   MONOABS 0.4  --   --   --   --   --   --   EOSABS 0.1  --   --   --   --   --   --   BASOSABS 0.0  --   --   --   --   --   --   < > = values in this interval not displayed.  Chemistries   Recent Labs Lab 05/18/13 1718 05/21/13 0701 05/23/13 0405 05/25/13 0456  NA 142 140 134* 137  K 4.8 4.0 3.7 3.7  CL 99 93* 87* 91*  CO2 21 21 22 26   GLUCOSE 183* 108* 308* 86  BUN 75* 76* 62* 50*  CREATININE 11.43* 11.51* 8.63* 6.99*  CALCIUM 9.6 9.7 9.4 10.1  MG 2.8*  --   --   --   AST 8  --   --   --   ALT 8  --   --   --   ALKPHOS 129*  --   --   --   BILITOT 0.2*  --   --   --    ------------------------------------------------------------------------------------------------------------------ estimated creatinine clearance is 7 ml/min (by C-G formula based on Cr of 6.99). ------------------------------------------------------------------------------------------------------------------ No results found for this basename: HGBA1C,  in the last 72 hours ------------------------------------------------------------------------------------------------------------------ No results found for this basename: CHOL, HDL, LDLCALC, TRIG, CHOLHDL, LDLDIRECT,  in the last 72 hours ------------------------------------------------------------------------------------------------------------------ No results found for this basename: TSH, T4TOTAL, FREET3, T3FREE, THYROIDAB,  in the last 72  hours ------------------------------------------------------------------------------------------------------------------ No results found for this basename: VITAMINB12, FOLATE, FERRITIN, TIBC, IRON, RETICCTPCT,  in the last 72 hours  Coagulation profile No results found for this basename: INR, PROTIME,  in the last 168 hours  No results found for this basename: DDIMER,  in the last 72 hours  Cardiac Enzymes No results  found for this basename: CK, CKMB, TROPONINI, MYOGLOBIN,  in the last 168 hours ------------------------------------------------------------------------------------------------------------------ No components found with this basename: POCBNP,     Kalieb Freeland D.O. on 05/25/2013 at 9:17 AM  Between 7am to 7pm - Pager - [phone]  After 7pm go to [email or web address] - password [email or web address]  And look for the night coverage person covering for me after hours  [facility] Office  [phone]

## 2013-05-25 NOTE — Progress Notes (Signed)
Hypoglycemic Event  CBG: 57  Treatment: 15 GM carbohydrate snack  Symptoms: None  Follow-up CBG: Time:04:42    CBG Result:78  Possible Reasons for Event: medication regimen  Comments/MD notified:per hypoglycemia protocol    Janeva Peaster Joselita,RN  Remember to initiate Hypoglycemia Order Set & complete

## 2013-05-25 NOTE — Progress Notes (Signed)
While patient was transferring to bedside commode with student RN, incontinent of loose,brown stool. Patient had formed BM in bedside commode with visible bright,red blood. Dr.Mikhail notified.

## 2013-05-26 LAB — BASIC METABOLIC PANEL
BUN: 21 mg/dL (ref 6–23)
CO2: 26 mEq/L (ref 19–32)
Calcium: 9.5 mg/dL (ref 8.4–10.5)
Chloride: 102 mEq/L (ref 96–112)
Creatinine, Ser: 3.99 mg/dL — ABNORMAL HIGH (ref 0.50–1.10)
GFR calc non Af Amer: 11 mL/min — ABNORMAL LOW (ref >90)
GFR, EST AFRICAN AMERICAN: 13 mL/min — AB (ref >90)
GLUCOSE: 130 mg/dL — AB (ref 70–99)
POTASSIUM: 4.6 meq/L (ref 3.7–5.3)
SODIUM: 144 meq/L (ref 137–147)

## 2013-05-26 LAB — CBC
HEMATOCRIT: 33.5 % — AB (ref 36.0–46.0)
HEMOGLOBIN: 11.2 g/dL — AB (ref 12.0–15.0)
MCH: 30.6 pg (ref 26.0–34.0)
MCHC: 33.4 g/dL (ref 30.0–36.0)
MCV: 91.5 fL (ref 78.0–100.0)
Platelets: 399 10*3/uL (ref 150–400)
RBC: 3.66 MIL/uL — ABNORMAL LOW (ref 3.87–5.11)
RDW: 14 % (ref 11.5–15.5)
WBC: 11.3 10*3/uL — AB (ref 4.0–10.5)

## 2013-05-26 LAB — GLUCOSE, CAPILLARY
GLUCOSE-CAPILLARY: 118 mg/dL — AB (ref 70–99)
GLUCOSE-CAPILLARY: 125 mg/dL — AB (ref 70–99)
GLUCOSE-CAPILLARY: 159 mg/dL — AB (ref 70–99)
Glucose-Capillary: 125 mg/dL — ABNORMAL HIGH (ref 70–99)
Glucose-Capillary: 199 mg/dL — ABNORMAL HIGH (ref 70–99)

## 2013-05-26 NOTE — Progress Notes (Signed)
ANTIBIOTIC CONSULT NOTE - FOLLOW UP  Pharmacy Consult for Vancomycin and Zosyn Indication: rule out pneumonia, bacteremia  No Known Allergies  Patient Measurements: Height: 5\' 3"  (160 cm) Weight: 128 lb 4.9 oz (58.2 kg) IBW/kg (Calculated) : 52.4  Vital Signs: Temp: 98 F (36.7 C) (03/01 0750) Temp src: Oral (03/01 0750) BP: 159/59 mmHg (03/01 0750) Pulse Rate: 73 (03/01 0750) Intake/Output from previous day: 02/28 0701 - 03/01 0700 In: 510 [P.O.:360; IV Piggyback:150] Out: 1000  Intake/Output from this shift:    Labs:  Recent Labs  05/24/13 0358 05/25/13 0456 05/25/13 0902 05/26/13 0553  WBC 22.4* 15.1*  --  11.3*  HGB 10.9* 11.7*  --  11.2*  PLT 415* 459*  --  399  CREATININE  --  6.99* 5.21* 3.99*   Estimated Creatinine Clearance: 12.2 ml/min (by C-G formula based on Cr of 3.99).    Microbiology: Recent Results (from the past 720 hour(s))  MRSA PCR SCREENING     Status: None   Collection Time    05/19/13  4:54 AM      Result Value Ref Range Status   MRSA by PCR NEGATIVE  NEGATIVE Final   Comment:            The GeneXpert MRSA Assay (FDA     approved for NASAL specimens     only), is one component of a     comprehensive MRSA colonization     surveillance program. It is not     intended to diagnose MRSA     infection nor to guide or     monitor treatment for     MRSA infections.  CULTURE, BLOOD (ROUTINE X 2)     Status: None   Collection Time    05/19/13  2:05 PM      Result Value Ref Range Status   Specimen Description BLOOD RIGHT ANTECUBITAL   Final   Special Requests BOTTLES DRAWN AEROBIC ONLY 10CC   Final   Culture  Setup Time     Final   Value: 05/19/2013 18:50     Performed at [facility]   Culture     Final   Value: VIRIDANS STREPTOCOCCUS     Note: Gram Stain Report Called to,Read Back By and Verified With: ANDY B @ [phone] 05/20/13 BY KRAWS     Performed at [facility]   Report Status 05/22/2013 FINAL   Final  CULTURE,  BLOOD (ROUTINE X 2)     Status: None   Collection Time    05/19/13  2:15 PM      Result Value Ref Range Status   Specimen Description BLOOD RIGHT HAND   Final   Special Requests BOTTLES DRAWN AEROBIC ONLY 10CC   Final   Culture  Setup Time     Final   Value: 05/19/2013 18:50     Performed at [facility]   Culture     Final   Value: NO GROWTH 5 DAYS     Performed at [facility]   Report Status 05/25/2013 FINAL   Final  CULTURE, BLOOD (ROUTINE X 2)     Status: None   Collection Time    05/22/13  7:50 AM      Result Value Ref Range Status   Specimen Description BLOOD RIGHT ANTECUBITAL   Final   Special Requests BOTTLES DRAWN AEROBIC ONLY [facility]   Final   Culture  Setup Time     Final   Value:  05/22/2013 12:40     Performed at [facility]   Culture     Final   Value:        BLOOD CULTURE RECEIVED NO GROWTH TO DATE CULTURE WILL BE HELD FOR 5 DAYS BEFORE ISSUING A FINAL NEGATIVE REPORT     Performed at [facility]   Report Status PENDING   Incomplete  CULTURE, BLOOD (ROUTINE X 2)     Status: None   Collection Time    05/22/13  7:55 AM      Result Value Ref Range Status   Specimen Description BLOOD RIGHT HAND   Final   Special Requests BOTTLES DRAWN AEROBIC ONLY 10CC   Final   Culture  Setup Time     Final   Value: 05/22/2013 12:40     Performed at [facility]   Culture     Final   Value:        BLOOD CULTURE RECEIVED NO GROWTH TO DATE CULTURE WILL BE HELD FOR 5 DAYS BEFORE ISSUING A FINAL NEGATIVE REPORT     Performed at [facility]   Report Status PENDING   Incomplete    Anti-infectives   Start     Dose/Rate Route Frequency Ordered Stop   05/25/13 0000  levofloxacin (LEVAQUIN) 500 MG tablet    Comments:  Patient received antibiotics in the hospital. Patient needs one more dose for completion of course.   500 mg Oral Daily 05/24/13 1235     05/21/13 1200  vancomycin (VANCOCIN) 500 mg in sodium chloride 0.9 % 100 mL IVPB      500 mg 100 mL/hr over 60 Minutes Intravenous Every T-Th-Sa (Hemodialysis) 05/20/13 1828     05/20/13 1930  vancomycin (VANCOCIN) 1,250 mg in sodium chloride 0.9 % 250 mL IVPB     1,250 mg 166.7 mL/hr over 90 Minutes Intravenous  Once 05/20/13 1828 05/20/13 2246   05/20/13 1800  piperacillin-tazobactam (ZOSYN) IVPB 2.25 g     2.25 g 100 mL/hr over 30 Minutes Intravenous Every 8 hours 05/20/13 1733        Assessment: 63 yo F admitted 3/23 with AMS/metabolic encephalopathy likely related to missed HD sessions.  But Pt's family attributed AMS to BP meds; and BP meds are currently held by nephrology.  Pt had HD yesterday and tolerated it well.  Pt continues on Vancomycin and Zosyn for r/o PNA vs bacteremia.  1/2 blood cx growing Viridans Strep (?possible contaminant).  Repeat cx from 2/25 are still NGTD.  Pt's WBC is still elevated at 11.3 and is currently afebrile.  Goal of Therapy:  Pre-HD Vancomycin level ~ 15-25 mcg/ml  Plan:  Continue Vancomycin 500 mg after each HD. Continue Zosyn 2.25 gm IV q8h.  Jeronimo Norma, PharmD Clinical Pharmacist Resident Pager: [phone]   05/26/2013 11:15 AM

## 2013-05-26 NOTE — Progress Notes (Signed)
[facility]                                                                              Patient Demographics  [name], is a 63 y.o. female, DOB - 01-Sep-1950, [id number]  Admit date - 05/18/2013   Admitting Physician Verlee Monte, MD  Outpatient Primary MD for the patient is HODGES,BETH, MD  LOS - 8   No chief complaint on file.       Assessment & Plan   Metabolic encephalopathy/ Uremic encephalopathy:  -Improving, patient oriented to self, place, and time -Continue vancomycin and zosyn -Patient remained afebrile, chest x-ray shows no active cardiopulmonary disease -Continue Haldol as needed -Will continue to monitor patient -Repeat blood cultures (05/22/13) show no growth   HCAP -CXR 05/19/13 shows infiltrate in left mid zone -Continue vancomycin and Zosyn -Leukocytosis trending downward  Type II or unspecified type diabetes mellitus  -Continue ISS, Lantus with CBG monitoring  End stage renal disease requiring hemodialysis -Nephrology consulted and following -Continue calcitriol  Hypertension -BP meds discontinued by nephrology -Patient's family feels that her blood pressure medications are causing her altered mental status -Will restart blood pressure medications as needed -May have been elevated to altered mental status, psychosis  Right foot ischemic changes:  -Vascular surgery consulted, ABI (0.37) showed severe PVD on the right. -Follow up with Dr. Oneida Alar as an outpatient in 2 weeks  Deconditioning -PT consulted for eval and treatment, recommended SNF or CIR  Code Status: Full  Family Communication: None at bedside.  Disposition Plan: Admitted.  Pending CIR evaluation or SNF placement.  Time Spent in minutes  20 minutes  Procedures  ABI Right = severe arterial disease. Left = moderate arterial disease  Consults   Nephrology Vascular surgery  DVT Prophylaxis  Heparin  Lab Results  Component Value Date   PLT 399 05/26/2013      Medications  Scheduled Meds: . aspirin EC  81 mg Oral Daily  . calcium acetate  667 mg Oral TID WC  . doxercalciferol  1 mcg Intravenous Q T,Th,Sa-HD  . feeding supplement (NEPRO CARB STEADY)  237 mL Oral BID BM  . haloperidol  2 mg Oral Once  . heparin  5,000 Units Subcutaneous 3 times per day  . insulin aspart  0-9 Units Subcutaneous 6 times per day  . insulin glargine  14 Units Subcutaneous QHS  . multivitamin  1 tablet Oral QHS  . piperacillin-tazobactam (ZOSYN)  IV  2.25 g Intravenous Q8H  . sodium chloride  3 mL Intravenous Q12H  . sodium chloride  3 mL Intravenous Q12H  . vancomycin  500 mg Intravenous Q T,Th,Sa-HD   Continuous Infusions:  PRN Meds:.sodium chloride, acetaminophen, acetaminophen, haloperidol lactate, hydrALAZINE, ondansetron (ZOFRAN) IV, ondansetron, polyethylene glycol, sodium chloride  Antibiotics    Anti-infectives   Start     Dose/Rate Route Frequency Ordered Stop   05/25/13 0000  levofloxacin (LEVAQUIN) 500 MG tablet    Comments:  Patient received antibiotics in the hospital. Patient needs one more dose for completion of course.   500 mg Oral Daily 05/24/13 1235     05/21/13 1200  vancomycin (VANCOCIN) 500 mg in sodium chloride 0.9 %  100 mL IVPB     500 mg 100 mL/hr over 60 Minutes Intravenous Every T-Th-Sa (Hemodialysis) 05/20/13 1828     05/20/13 1930  vancomycin (VANCOCIN) 1,250 mg in sodium chloride 0.9 % 250 mL IVPB     1,250 mg 166.7 mL/hr over 90 Minutes Intravenous  Once 05/20/13 1828 05/20/13 2246   05/20/13 1800  piperacillin-tazobactam (ZOSYN) IVPB 2.25 g     2.25 g 100 mL/hr over 30 Minutes Intravenous Every 8 hours 05/20/13 1733          Subjective:   [employer] seen and examined today.  Patient has no complaints this morning.  Patient is more alert today and seems to be back at baseline.  Speaking to her brother on the phone.  Objective:   Filed Vitals:   05/25/13 1640 05/25/13 2010 05/26/13 0418 05/26/13 0750  BP:  152/62 172/63 153/59 159/59  Pulse: 82 82 78 73  Temp: 97.9 F (36.6 C) 98.8 F (37.1 C) 98.3 F (36.8 C) 98 F (36.7 C)  TempSrc: Oral Oral Oral Oral  Resp: 17 18 18 18   Height:      Weight:  58.2 kg (128 lb 4.9 oz)    SpO2: 96% 100% 99% 100%    Wt Readings from Last 3 Encounters:  05/25/13 58.2 kg (128 lb 4.9 oz)  11/02/12 68.266 kg (150 lb 8 oz)  09/14/12 65.772 kg (145 lb)     Intake/Output Summary (Last 24 hours) at 05/26/13 0906 Last data filed at 05/26/13 0700  Gross per 24 hour  Intake    510 ml  Output   1000 ml  Net   -490 ml    Exam  General: Well developed, well nourished, NAD, appears stated age  HEENT: NCAT, mucous membranes moist.  Neck: Supple, no JVD, no masses  Cardiovascular: S1 S2 auscultated. Regular rate and rhythm.  Respiratory: Clear to auscultation bilaterally with equal chest rise  Abdomen: Soft, nontender, nondistended, + bowel sounds  Extremities: warm dry without cyanosis clubbing or edema  Neuro: AAOX3,  no focal deficits   Skin: Without rashes exudates or nodules  Psych: Appropriate mood and affect  Data Review   Micro Results Recent Results (from the past 240 hour(s))  MRSA PCR SCREENING     Status: None   Collection Time    05/19/13  4:54 AM      Result Value Ref Range Status   MRSA by PCR NEGATIVE  NEGATIVE Final   Comment:            The GeneXpert MRSA Assay (FDA     approved for NASAL specimens     only), is one component of a     comprehensive MRSA colonization     surveillance program. It is not     intended to diagnose MRSA     infection nor to guide or     monitor treatment for     MRSA infections.  CULTURE, BLOOD (ROUTINE X 2)     Status: None   Collection Time    05/19/13  2:05 PM      Result Value Ref Range Status   Specimen Description BLOOD RIGHT ANTECUBITAL   Final   Special Requests BOTTLES DRAWN AEROBIC ONLY 10CC   Final   Culture  Setup Time     Final   Value: 05/19/2013 18:50     Performed  at [facility]   Culture     Final   Value: VIRIDANS STREPTOCOCCUS  Note: Gram Stain Report Called to,Read Back By and Verified With: ANDY B @ [phone] 05/20/13 BY KRAWS     Performed at [facility]   Report Status 05/22/2013 FINAL   Final  CULTURE, BLOOD (ROUTINE X 2)     Status: None   Collection Time    05/19/13  2:15 PM      Result Value Ref Range Status   Specimen Description BLOOD RIGHT HAND   Final   Special Requests BOTTLES DRAWN AEROBIC ONLY 10CC   Final   Culture  Setup Time     Final   Value: 05/19/2013 18:50     Performed at [facility]   Culture     Final   Value: NO GROWTH 5 DAYS     Performed at [facility]   Report Status 05/25/2013 FINAL   Final  CULTURE, BLOOD (ROUTINE X 2)     Status: None   Collection Time    05/22/13  7:50 AM      Result Value Ref Range Status   Specimen Description BLOOD RIGHT ANTECUBITAL   Final   Special Requests BOTTLES DRAWN AEROBIC ONLY [facility]   Final   Culture  Setup Time     Final   Value: 05/22/2013 12:40     Performed at [facility]   Culture     Final   Value:        BLOOD CULTURE RECEIVED NO GROWTH TO DATE CULTURE WILL BE HELD FOR 5 DAYS BEFORE ISSUING A FINAL NEGATIVE REPORT     Performed at [facility]   Report Status PENDING   Incomplete  CULTURE, BLOOD (ROUTINE X 2)     Status: None   Collection Time    05/22/13  7:55 AM      Result Value Ref Range Status   Specimen Description BLOOD RIGHT HAND   Final   Special Requests BOTTLES DRAWN AEROBIC ONLY 10CC   Final   Culture  Setup Time     Final   Value: 05/22/2013 12:40     Performed at [facility]   Culture     Final   Value:        BLOOD CULTURE RECEIVED NO GROWTH TO DATE CULTURE WILL BE HELD FOR 5 DAYS BEFORE ISSUING A FINAL NEGATIVE REPORT     Performed at [facility]   Report Status PENDING   Incomplete    Radiology Reports Dg Chest 1 View  05/19/2013   CLINICAL DATA:  Confusion.  EXAM: CHEST - 1  VIEW  COMPARISON:  05/18/2013 and 08/06/2012  FINDINGS: There is a small patchy area of density in the left midzone. The lungs are otherwise clear. Heart size and vascularity are normal. No osseous abnormality.  IMPRESSION: Small new area of atelectasis/ infiltrate in the left midzone.   Electronically Signed   By: Rozetta Nunnery M.D.   On: 05/19/2013 20:41   Ct Head Wo Contrast  05/19/2013   CLINICAL DATA:  Confusion.  Dialysis patient  EXAM: CT HEAD WITHOUT CONTRAST  TECHNIQUE: Contiguous axial images were obtained from the base of the skull through the vertex without intravenous contrast.  COMPARISON:  CT 05/18/2013  FINDINGS: Generalized atrophy. Moderate to advanced chronic microvascular ischemic changes in the white matter. Chronic ischemia in the thalamus bilaterally. These findings are stable.  Negative for acute infarct.  Negative for acute hemorrhage or mass.  Mucosal edema in the paranasal sinuses. Air-fluid level left maxillary  sinus. No acute skull abnormality. Atherosclerotic disease.  IMPRESSION: Atrophy and chronic microvascular ischemia. No acute intracranial abnormality.  Sinusitis with air-fluid level.   Electronically Signed   By: Franchot Gallo M.D.   On: 05/19/2013 12:53   Dg Chest Port 1 View  05/21/2013   CLINICAL DATA:  63 year old female with shortness of breath.  EXAM: PORTABLE CHEST - 1 VIEW  COMPARISON:  05/19/2013 and prior chest radiographs  FINDINGS: Cardiomegaly identified.  There is no evidence of focal airspace disease, pulmonary edema, suspicious pulmonary nodule/mass, pleural effusion, or pneumothorax. No acute bony abnormalities are identified.  IMPRESSION: Cardiomegaly without evidence of active cardiopulmonary disease.   Electronically Signed   By: Hassan Rowan M.D.   On: 05/21/2013 21:00    CBC  Recent Labs Lab 05/22/13 0750 05/23/13 0405 05/24/13 0358 05/25/13 0456 05/26/13 0553  WBC 25.0* 21.2* 22.4* 15.1* 11.3*  HGB 10.9* 11.1* 10.9* 11.7* 11.2*  HCT 31.0*  32.0* 32.0* 33.9* 33.5*  PLT 295 319 415* 459* 399  MCV 89.1 89.4 90.1 89.4 91.5  MCH 31.3 31.0 30.7 30.9 30.6  MCHC 35.2 34.7 34.1 34.5 33.4  RDW 14.1 14.2 14.0 13.8 14.0    Chemistries   Recent Labs Lab 05/21/13 0701 05/23/13 0405 05/25/13 0456 05/25/13 0902 05/26/13 0553  NA 140 134* 137 137 144  K 4.0 3.7 3.7 3.3* 4.6  CL 93* 87* 91* 94* 102  CO2 21 22 26 26 26   GLUCOSE 108* 308* 86 132* 130*  BUN 76* 62* 50* 38* 21  CREATININE 11.51* 8.63* 6.99* 5.21* 3.99*  CALCIUM 9.7 9.4 10.1 9.3 9.5   ------------------------------------------------------------------------------------------------------------------ estimated creatinine clearance is 12.2 ml/min (by C-G formula based on Cr of 3.99). ------------------------------------------------------------------------------------------------------------------ No results found for this basename: HGBA1C,  in the last 72 hours ------------------------------------------------------------------------------------------------------------------ No results found for this basename: CHOL, HDL, LDLCALC, TRIG, CHOLHDL, LDLDIRECT,  in the last 72 hours ------------------------------------------------------------------------------------------------------------------ No results found for this basename: TSH, T4TOTAL, FREET3, T3FREE, THYROIDAB,  in the last 72 hours ------------------------------------------------------------------------------------------------------------------ No results found for this basename: VITAMINB12, FOLATE, FERRITIN, TIBC, IRON, RETICCTPCT,  in the last 72 hours  Coagulation profile No results found for this basename: INR, PROTIME,  in the last 168 hours  No results found for this basename: DDIMER,  in the last 72 hours  Cardiac Enzymes No results found for this basename: CK, CKMB, TROPONINI, MYOGLOBIN,  in the last 168  hours ------------------------------------------------------------------------------------------------------------------ No components found with this basename: POCBNP,     Alany Borman D.O. on 05/26/2013 at 9:06 AM  Between 7am to 7pm - Pager - [phone]  After 7pm go to [email or web address] - password [email or web address]  And look for the night coverage person covering for me after hours  [facility] Office  [phone]

## 2013-05-26 NOTE — Progress Notes (Signed)
[facility] Progress Note  Subjective:   Too weak to stand up.  "They are going to get me rehab"  Objective Filed Vitals:   05/25/13 2010 05/26/13 0418 05/26/13 0750 05/26/13 1158  BP: 172/63 153/59 159/59 156/57  Pulse: 82 78 73 66  Temp: 98.8 F (37.1 C) 98.3 F (36.8 C) 98 F (36.7 C) 97.9 F (36.6 C)  TempSrc: Oral Oral Oral Oral  Resp: 18 18 18 17   Height:      Weight: 58.2 kg (128 lb 4.9 oz)     SpO2: 100% 99% 100% 100%   Physical Exam goal 1.5 Bed weight 60.3 General: NAD supine on HD Heart: RRR Lungs: no wheezes or rales Abdomen: abd soft NT Extremities: no LE edema Dialysis Access: left AVGG Qb 400   Dialysis: TTS [facility]  3h 65min 59.5kg 2K/2.0 Bath Heparin 6000 L arm AVG  Hectorol 2 Epo 1200 Venofer none  Recent lab: Hb 11.1 tsat 59% ferritin 1192 phos 6.8 pth 331   Assessment/Plan:  1 AMS- appears to be back to baseline  2 HCAP/ leukocytosis improved at 15K- on vanc/zosyn  2 ESRD TTS - HD being initiated - K 3.7 - 4 K bath so can't use 2 Ca bath 3 HTN/volume- off all BP medication now, restart as needed. Euvolemic. Would use norvasc first if needed.  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 SNFP vs CIR according to primary's notes. HD TTS. MS to baseline, mild dementia, and quite debilitated. Kelly Splinter MD pager [phone]    cell [phone] 05/26/2013, 2:36 PM        Additional  Objective Labs: Basic Metabolic Panel:  Recent Labs Lab 05/21/13 0701  05/25/13 0456 05/25/13 0902 05/26/13 0553  NA 140  < > 137 137 144  K 4.0  < > 3.7 3.3* 4.6  CL 93*  < > 91* 94* 102  CO2 21  < > 26 26 26   GLUCOSE 108*  < > 86 132* 130*  BUN 76*  < > 50* 38* 21  CREATININE 11.51*  < > 6.99* 5.21* 3.99*  CALCIUM 9.7  < > 10.1 9.3 9.5  PHOS 7.4*  --   --  3.3  3.3  --   < > = values in this interval not displayed. Liver Function Tests:  Recent Labs Lab 05/21/13 0701 05/25/13 0902  ALBUMIN 3.2* 2.4*   CBC:  Recent Labs Lab 05/22/13 0750 05/23/13 0405 05/24/13 0358 05/25/13 0456 05/26/13 0553  WBC 25.0* 21.2* 22.4* 15.1* 11.3*  HGB 10.9* 11.1* 10.9* 11.7* 11.2*  HCT 31.0* 32.0* 32.0* 33.9* 33.5*  MCV 89.1 89.4 90.1 89.4 91.5  PLT 295 319 415* 459* 399   Blood Culture    Component Value Date/Time   SDES BLOOD RIGHT HAND 05/22/2013 0755   SPECREQUEST BOTTLES DRAWN AEROBIC ONLY 10CC 05/22/2013 0755  CULT  Value:        BLOOD CULTURE RECEIVED NO GROWTH TO DATE CULTURE WILL BE HELD FOR 5 DAYS BEFORE ISSUING A FINAL NEGATIVE REPORT Performed at [facility] 05/22/2013 0755   REPTSTATUS PENDING 05/22/2013 0755   CBG:  Recent Labs Lab 05/25/13 2004 05/26/13 0001 05/26/13 0415 05/26/13 0755 05/26/13 1157  GLUCAP 150* 118* 125* 125* 159*   Medications:   . aspirin EC  81 mg Oral Daily  . calcium acetate  667 mg Oral TID WC  . doxercalciferol  1 mcg Intravenous Q T,Th,Sa-HD  . feeding supplement (NEPRO CARB STEADY)  237 mL Oral BID BM  . haloperidol  2 mg Oral Once  . heparin  5,000 Units Subcutaneous 3 times per day  . insulin aspart  0-9 Units Subcutaneous 6 times per day  . insulin glargine  14 Units Subcutaneous QHS  . multivitamin  1 tablet Oral QHS  . piperacillin-tazobactam (ZOSYN)  IV  2.25 g Intravenous Q8H  . sodium chloride  3 mL Intravenous Q12H  . sodium chloride  3 mL Intravenous Q12H  . vancomycin  500 mg Intravenous Q T,Th,Sa-HD

## 2013-05-27 LAB — GLUCOSE, CAPILLARY
GLUCOSE-CAPILLARY: 99 mg/dL (ref 70–99)
GLUCOSE-CAPILLARY: 273 mg/dL — AB (ref 70–99)
Glucose-Capillary: 164 mg/dL — ABNORMAL HIGH (ref 70–99)
Glucose-Capillary: 184 mg/dL — ABNORMAL HIGH (ref 70–99)
Glucose-Capillary: 253 mg/dL — ABNORMAL HIGH (ref 70–99)

## 2013-05-27 MED ORDER — AMLODIPINE BESYLATE 5 MG PO TABS: 5.0000 mg | ORAL_TABLET | Freq: Every day | ORAL | Status: DC

## 2013-05-27 MED ORDER — INSULIN DETEMIR 100 UNIT/ML FLEXPEN: 14.0000 [IU] | PEN_INJECTOR | Freq: Every day | SUBCUTANEOUS | Status: DC

## 2013-05-27 MED ORDER — AMLODIPINE BESYLATE 5 MG PO TABS
5.0000 mg | ORAL_TABLET | Freq: Every day | ORAL | Status: DC
  Administered 2013-05-27: 5 mg via ORAL
  Filled 2013-05-27 (×2): qty 1

## 2013-05-27 NOTE — Clinical Social Work Psychosocial (Signed)
Clinical Social Work Department BRIEF PSYCHOSOCIAL ASSESSMENT 05/27/2013  Patient:  [name], [name]     Account Number:  [id number]     Admit date:  05/18/2013  Clinical Social Worker:  Frederico Hamman  Date/Time:  05/27/2013 06:42 AM  Referred by:  Physician  Date Referred:  05/24/2013 Referred for  SNF Placement   Other Referral:   Interview type:  Patient Other interview type:   CSW talked with patient and her daughter [name] and grandaughter [name].    PSYCHOSOCIAL DATA Living Status:  FAMILY Admitted from facility:   Level of care:   Primary support name:  [name] Primary support relationship to patient:  CHILD, ADULT Degree of support available:   Strong family support    CURRENT CONCERNS Current Concerns  Post-Acute Placement   Other Concerns:    SOCIAL WORK ASSESSMENT / PLAN CSW visited with patient and talked with her about discharge planning and recommendation of [facility]. Patient is alert and oriented x3 (not to time) and was able to converse with CSW. [name] is agreeable to going somewhere for rehab, however when CSW presented her with the SNF list, she informed CSW that she can't see and gave permission for her daughter to be contacted.    CSW spoke with [name] by phone and discussed ST rehab, provided support, empathetic listening and answered her questions. Daughter in agreement with ST rehab. CSW explained facility search process and reviewed available facilities in [facility]. Daughter interested in [facility] as they live in that town and transport patient to dialysis. CSW also spoke by phone with granddaughter [name] regarding ST rehab and answered her questions. [name] was able to provide CSW with patient's medicare number and completed admissions paperwork at facility.   Assessment/plan status:  No Further Intervention Required Other assessment/ plan:   Information/referral to community resources:    SNF list    PATIENT'S/FAMILY'S RESPONSE TO PLAN OF CARE: Patient very pleasant and receptive to talking with CSW. She gave consent for contact with daughter. Both daughter and granddaughter very concerned about patient and her care and in agreement with ST rehab. Granddaughter [name] handles patient's business and was willing to complete paperwork at facility.

## 2013-05-27 NOTE — Consult Note (Signed)
Physical Medicine and Rehabilitation Consult Reason for Consult: Metabolic/uremic encephalopathy Referring Physician: [facility]    HPI: [name] is a 63 y.o. right-handed female with history of diabetes mellitus and peripheral neuropathy, diastolic congestive heart failure, end-stage renal disease with hemodialysis. Admitted 05/18/2013 with altered mental status as well as bouts of nausea. By report patient had missed her latest week of dialysis due to transportation issues.. Cranial CT scan negative for acute abnormalities. BUN 70 creatinine 11.5 on admission. Suspect uremic encephalopathy. Renal service consulted hemodialysis ongoing. Mental status continues to improve. Blood cultures showed no growth. Maintained on empiric antibiotics. Noted right foot with ischemic changes and vascular surgery Dr. Oneida Alar consulted. ABIs(0.37) showed severe peripheral vascular disease on the right. Plan is for arteriogram which could be completed on an outpatient basis. Maintained on subcutaneous heparin for DVT prophylaxis. Physical therapy evaluation completed an ongoing. M.D. as requested physical medicine rehabilitation consult to consider inpatient rehabilitation services   Review of Systems  Cardiovascular: Positive for leg swelling.  Gastrointestinal: Positive for constipation.  Musculoskeletal: Positive for myalgias.  Psychiatric/Behavioral: Positive for depression and memory loss.  All other systems reviewed and are negative.   Past Medical History  Diagnosis Date  . Diabetes mellitus   . Hypertension   . CHF (congestive heart failure)   . Hyperlipidemia   . End-stage renal disease on hemodialysis     Started dialysis in late 2014. Gets HD in [place] on TTS schedule.     . Thyroid disease   . Depression   . Shortness of breath     at night  . Arthritis   . Dementia   . Stroke     2013, weakness- all over  . Constipation   . Vision disturbance   . End stage renal disease  03/30/2011   Past Surgical History  Procedure Laterality Date  . Arteriovenous graft placement      left upper extremity  . Tubal ligation      x2  . Av fistula placement Left 07/02/2012    Procedure: ARTERIOVENOUS (AV) FISTULA CREATION;  Surgeon: Conrad [place], MD;  Location: [place];  Service: Vascular;  Laterality: Left;  . Av fistula placement Left 10/01/2012    Procedure: INSERTION OF ARTERIOVENOUS (AV) GORE-TEX GRAFT LEFT UPPER ARM;  Surgeon: Conrad [place], MD;  [facility];  Service: Vascular;  Laterality: Left;  Ultrasound guided   Family History  Problem Relation Age of Onset  . Diabetes Father   . Stroke Father   . Heart disease Mother    Social History:  reports that she has never smoked. She has never used smokeless tobacco. She reports that she does not drink alcohol or use illicit drugs. Allergies: No Known Allergies Medications Prior to Admission  Medication Sig Dispense Refill  . aspirin EC 81 MG tablet Take 81 mg by mouth daily.      . Insulin Glargine (LANTUS [place]) Inject 20 Units into the skin at bedtime.        . [DISCONTINUED] amLODipine (NORVASC) 5 MG tablet Take 5 mg by mouth daily.        . [DISCONTINUED] calcitRIOL (ROCALTROL) 0.25 MCG capsule Take 0.25 mcg by mouth daily.        . [DISCONTINUED] carvedilol (COREG) 25 MG tablet Take 25 mg by mouth 2 (two) times daily with a meal.        . [DISCONTINUED] furosemide (LASIX) 80 MG tablet Take 80 mg by mouth daily.        . [  DISCONTINUED] gabapentin (NEURONTIN) 600 MG tablet Take 600 mg by mouth 3 (three) times daily.        . [DISCONTINUED] hydrALAZINE (APRESOLINE) 50 MG tablet Take 50 mg by mouth 3 (three) times daily.      . [DISCONTINUED] oxyCODONE (ROXICODONE) 5 MG immediate release tablet Take 1-2 tablets (5-10 mg total) by mouth every 4 (four) hours as needed for pain.  30 tablet  0  . [DISCONTINUED] quinapril (ACCUPRIL) 20 MG tablet Take 20 mg by mouth daily.        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, th, sat Functional Status:  Mobility:     Ambulation/Gait Ambulation Distance (Feet): 10 Feet (x 2) General Gait Details: assist for proximity to walker, for posture, balance and safety; second trial wtih less assist due to shorter walker and had shoes on    ADL:    Cognition: Cognition Overall Cognitive Status: Impaired/Different from baseline Orientation Level: Oriented to person;Oriented to place;Oriented to situation;Disoriented to time Cognition Arousal/Alertness: Awake/alert Behavior During Therapy: Agitated Overall Cognitive Status: Impaired/Different from baseline Area of Impairment: Orientation;Attention;Following commands;Safety/judgement;Problem solving Orientation Level: Disoriented to;Time Current Attention Level: Sustained Following Commands: Follows one step commands with increased time Safety/Judgement: Decreased awareness of safety Problem Solving: Slow processing;Decreased initiation  Blood pressure 169/59, pulse 76, temperature 98.5 F (36.9 C), temperature source Oral, resp. rate 18, height 5\' 3"  (1.6 m), weight 57.7 kg (127 lb 3.3 oz), SpO2 100.00%. Physical Exam  HENT:  Head: Normocephalic.  Eyes:  Pupils sluggish to light  Neck: Normal range of motion. Neck supple. No thyromegaly present.  Cardiovascular: Normal rate and regular rhythm.   Respiratory: Effort normal and breath sounds normal. No respiratory distress.  GI: Soft. Bowel sounds are normal. She exhibits no distension.  Musculoskeletal:  Both feet sensitive to touch over the toes. Toes slightly cool to touch.   Neurological: She is alert.  Patient provides name and age as  well as date of birth. She does follow simple commands. She is a poor medical historian. UE strength 4/5. RLE 2+ prox to 3/5 distally. LLE is 3+ prox to 3+ distally. ?decreased PP over distal toes.   Skin: Skin is warm and dry.  Psychiatric: She has a normal mood and affect. Her behavior is normal. Judgment and thought content normal.    Results for orders placed during the hospital encounter of 05/18/13 (from the past 24 hour(s))  GLUCOSE, CAPILLARY     Status: Abnormal   Collection Time    05/26/13  7:55 AM      Result Value Ref Range   Glucose-Capillary 125 (*) 70 - 99 mg/dL  GLUCOSE, CAPILLARY     Status: Abnormal   Collection Time    05/26/13 11:57 AM      Result Value Ref Range   Glucose-Capillary 159 (*) 70 - 99 mg/dL  GLUCOSE, CAPILLARY     Status: Abnormal   Collection Time    05/26/13  5:01 PM      Result Value Ref Range   Glucose-Capillary 199 (*) 70 - 99 mg/dL  GLUCOSE, CAPILLARY     Status: Abnormal   Collection Time    05/26/13  7:59 PM      Result Value Ref  Range   Glucose-Capillary 253 (*) 70 - 99 mg/dL  GLUCOSE, CAPILLARY     Status: Abnormal   Collection Time    05/27/13 12:22 AM      Result Value Ref Range   Glucose-Capillary 184 (*) 70 - 99 mg/dL   No results found.  Assessment/Plan: Diagnosis: metabolic encephalopathy 1. Does the need for close, 24 hr/day medical supervision in concert with the patient's rehab needs make it unreasonable for this patient to be served in a less intensive setting? Yes 2. Co-Morbidities requiring supervision/potential complications: ESRD, DM 3. Due to bladder management, bowel management, safety, skin/wound care, disease management, medication administration, pain management and patient education, does the patient require 24 hr/day rehab nursing? Yes 4. Does the patient require coordinated care of a physician, rehab nurse, PT (1-2 hrs/day, 5 days/week) and OT (1-2 hrs/day, 5 days/week) to address physical and functional  deficits in the context of the above medical diagnosis(es)? Yes Addressing deficits in the following areas: balance, endurance, locomotion, strength, transferring, bowel/bladder control, bathing, dressing, feeding, grooming and toileting 5. Can the patient actively participate in an intensive therapy program of at least 3 hrs of therapy per day at least 5 days per week? Yes 6. The potential for patient to make measurable gains while on inpatient rehab is excellent 7. Anticipated functional outcomes upon discharge from inpatient rehab are min assist with PT, min assist with OT, ?TBD with SLP. 8. Estimated rehab length of stay to reach the above functional goals is: 14-18 days 9. Does the patient have adequate social supports to accommodate these discharge functional goals? Yes 10. Anticipated D/C setting: Home 11. Anticipated post D/C treatments: [place] therapy 12. Overall Rehab/Functional Prognosis: excellent  RECOMMENDATIONS: This patient's condition is appropriate for continued rehabilitative care in the following setting: CIR Patient has agreed to participate in recommended program. Yes Note that insurance prior authorization may be required for reimbursement for recommended care.  Comment: Rehab Admissions Coordinator to follow up.  Thanks,  Meredith Staggers, MD, Mellody Drown     05/27/2013

## 2013-05-27 NOTE — Progress Notes (Signed)
[facility] Progress Note  Subjective:   Waiting on breakfast.  Doesn't know d/c plan. Could walk prior to admission.  Objective Filed Vitals:   05/26/13 1805 05/26/13 1851 05/26/13 2004 05/27/13 0429  BP: 200/66 154/85 176/64 169/59  Pulse: 76  81 76  Temp:   98.7 F (37.1 C) 98.5 F (36.9 C)  TempSrc:   Oral Oral  Resp:   18 18  Height:      Weight:   57.7 kg (127 lb 3.3 oz)   SpO2:   100% 100%   Physical Exam General: NAD in chair Heart: RRR Lungs: no wheezes or rales Abdomen: soft  Extremities: no LE edema Dialysis Access: left AVGG Neuro:  [facility], 19..no..20-0-5-1  Dialysis: TTS [place]  3h 4min 59.5kg 2K/2.0 Bath Heparin 6000 L arm AVG  Hectorol 2 Epo 1200 Venofer none  Recent lab: Hb 11.1 tsat 59% ferritin 1192 phos 6.8 pth 331   Assessment/Plan:  1 AMS- appears to be back to baseline though a little confused with details 2 HCAP/ leukocytosis improved at 11.3K- on vanc/zosyn  2 ESRD TTS via AVG - HD- K 4.6 Sat 3 HTN/volume- off all BP medication now, restart as needed. Euvolemic. start norvasc 5 now and then q HS starting Tuesday; continue to titrate volume.  Does not tolerate low BPs  4 2HPT- phoslo/hectorol were held for slightly high Ca, may need nonCa binder - stopped calcitriol; resumed hectorol at 1 and resumed phoslo 1 ac for now; BMP done this am; need to check Alb and P - ask lab to add on before dosing hectorol today; may need to change to none Ca based binder - use 2 Ca bath 5 Anemia- Hgb 11.1>10.9>11.7>11.2 stable holding esa for now  6 DM on insulin  7 Nutrition - add multivit and nepro between meals  8 Dispo- PT eval rec: SNF vs rehab   Myriam Jacobson, PA-C [place] [phone] 05/27/2013,9:02 AM  LOS: 9 days   Patient seen and examined, agree with above note with above modifications. Hopeful for discharge soon  Corliss Parish, MD 05/27/2013      Additional Objective Labs: Basic Metabolic  Panel:  Recent Labs Lab 05/21/13 0701  05/25/13 0456 05/25/13 0902 05/26/13 0553  NA 140  < > 137 137 144  K 4.0  < > 3.7 3.3* 4.6  CL 93*  < > 91* 94* 102  CO2 21  < > 26 26 26   GLUCOSE 108*  < > 86 132* 130*  BUN 76*  < > 50* 38* 21  CREATININE 11.51*  < > 6.99* 5.21* 3.99*  CALCIUM 9.7  < > 10.1 9.3 9.5  PHOS 7.4*  --   --  3.3  3.3  --   < > = values in this interval not displayed. Liver Function Tests:  Recent Labs Lab 05/21/13 0701 05/25/13 0902  ALBUMIN 3.2* 2.4*   CBC:  Recent Labs Lab 05/22/13 0750 05/23/13 0405 05/24/13 0358 05/25/13 0456 05/26/13 0553  WBC 25.0* 21.2* 22.4* 15.1* 11.3*  HGB 10.9* 11.1* 10.9* 11.7* 11.2*  HCT 31.0* 32.0* 32.0* 33.9* 33.5*  MCV 89.1 89.4 90.1 89.4 91.5  PLT 295 319 415* 459* 399  CBG:  Recent Labs Lab 05/26/13 1157 05/26/13 1701 05/26/13 1959 05/27/13 0022 05/27/13 0747  GLUCAP 159* 199* 253* 184* 99   IMedications:   . aspirin EC  81 mg Oral Daily  . calcium acetate  667 mg Oral TID WC  .  doxercalciferol  1 mcg Intravenous Q T,Th,Sa-HD  . feeding supplement (NEPRO CARB STEADY)  237 mL Oral BID BM  . haloperidol  2 mg Oral Once  . heparin  5,000 Units Subcutaneous 3 times per day  . insulin aspart  0-9 Units Subcutaneous 6 times per day  . insulin glargine  14 Units Subcutaneous QHS  . multivitamin  1 tablet Oral QHS  . piperacillin-tazobactam (ZOSYN)  IV  2.25 g Intravenous Q8H  . sodium chloride  3 mL Intravenous Q12H  . sodium chloride  3 mL Intravenous Q12H  . vancomycin  500 mg Intravenous Q T,Th,Sa-HD

## 2013-05-27 NOTE — Progress Notes (Signed)
Physical Therapy Treatment Patient Details Name: [name] MRN: [id number] DOB: 27-Dec-1950 Today's Date: 05/27/2013 Time: 4098-1191 PT Time Calculation (min): 24 min  PT Assessment / Plan / Recommendation  History of Present Illness [name] is a 63 y.o. female  Past medical history of end-stage renal disease dialyzes Tuesday Thursdays and Saturdays, diabetes hypertension and coronary artery disease that was brought to the hospital due to confusion, missed dialysis.   PT Comments   Patient ambulating better with increased postural control, increased endurance and tolerance.  Still needing SNF rehab due to needing hands on assist and prior to admission was independent with ambulation.  Will continue to follow acutely.  Follow Up Recommendations  Supervision/Assistance - 24 hour;SNF           Equipment Recommendations  None recommended by PT    Recommendations for Other Services  None  Frequency Min 3X/week   Progress towards PT Goals Progress towards PT goals: Progressing toward goals  Plan Current plan remains appropriate    Precautions / Restrictions Precautions Precautions: Fall   Pertinent Vitals/Pain C/o left great toe pain    Mobility  Bed Mobility General bed mobility comments: NT pt in chair Transfers Overall transfer level: Needs assistance Equipment used: Rolling walker (2 wheeled) Transfers: Sit to/from [facility] Sit to Stand: Min assist;Mod assist Stand pivot transfers: Min assist General transfer comment: increased assist from low recliner, from [facility] needed decreased assist, cues for hand placement,  Pivot with walker to [facility] Ambulation/Gait Ambulation/Gait assistance: Min assist Ambulation Distance (Feet): 50 Feet Assistive device: Rolling walker (2 wheeled) Gait Pattern/deviations: Step-through pattern;Decreased stride length;Trunk flexed;Antalgic General Gait Details: decreased stance time on left, c/o painful on bottoms of feet  despite wearing shoes; cues and assist for walker due to veering right, cues for eyes ahead      PT Goals (current goals can now be found in the care plan section)    Visit Information  Last PT Received On: 05/27/13 Assistance Needed: +1 History of Present Illness: [name] is a 63 y.o. female  Past medical history of end-stage renal disease dialyzes Tuesday Thursdays and Saturdays, diabetes hypertension and coronary artery disease that was brought to the hospital due to confusion, missed dialysis.    Subjective Data      Cognition  Cognition Arousal/Alertness: Awake/alert Behavior During Therapy: WFL for tasks assessed/performed Overall Cognitive Status: Within Functional Limits for tasks assessed    Balance  Balance Overall balance assessment: Needs assistance Sitting balance-Leahy Scale: Fair Standing balance support: Bilateral upper extremity supported Standing balance-Leahy Scale: Poor Standing balance comment: UE support needed for balance  End of Session PT - End of Session Equipment Utilized During Treatment: Gait belt Activity Tolerance: Patient limited by pain Patient left: in chair;with call bell/phone within reach;with chair alarm set   GP     [facility] 05/27/2013, 12:54 PM Magda Kiel, [place] 05/27/2013

## 2013-05-27 NOTE — Clinical Social Work Placement (Signed)
Clinical Social Work Department CLINICAL SOCIAL WORK PLACEMENT NOTE 05/27/2013  Patient:  [name], [name]  Account Number:  [id number] Admit date:  05/18/2013  Clinical Social Worker:  Lakayla Barrington Givens, LCSW  Date/time:  05/27/2013 06:54 AM  Clinical Social Work is seeking post-discharge placement for this patient at the following level of care:   SKILLED NURSING   (*CSW will update this form in Epic as items are completed)   05/27/2013  Patient/family provided with [facility] Department of Clinical Social Work's list of facilities offering this level of care within the geographic area requested by the patient (or if unable, by the patient's family).  05/27/2013  Patient/family informed of their freedom to choose among providers that offer the needed level of care, that participate in Medicare, Medicaid or managed care program needed by the patient, have an available bed and are willing to accept the patient.    Patient/family informed of [facility]' ownership interest in [facility], as well as of the fact that they are under no obligation to receive care at this facility.  PASARR submitted to EDS on  PASARR number received from [place] on   FL2 transmitted to all facilities in geographic area requested by pt/family on  05/27/2013 FL2 transmitted to all facilities within larger geographic area on   Patient informed that his/her managed care company has contracts with or will negotiate with  certain facilities, including the following:     Patient/family informed of bed offers received:  05/27/2013 Patient chooses bed at OTHER Physician recommends and patient chooses bed at    Patient to be transferred to [facility] on  05/27/2013 Patient to be transferred to facility by ambulance  The following physician request were entered in Epic:   Additional Comments: 05/27/13: Patient discharged to [facility] today via ambulance. Discharge paperwork transmitted to facility.

## 2013-05-28 LAB — CULTURE, BLOOD (ROUTINE X 2)
CULTURE: NO GROWTH
Culture: NO GROWTH

## 2013-06-03 ENCOUNTER — Encounter (HOSPITAL_COMMUNITY): Payer: Self-pay | Admitting: *Deleted

## 2013-06-03 ENCOUNTER — Inpatient Hospital Stay (HOSPITAL_COMMUNITY): Payer: Medicaid Other

## 2013-06-03 ENCOUNTER — Inpatient Hospital Stay (HOSPITAL_COMMUNITY)
Admission: EM | Admit: 2013-06-03 | Discharge: 2013-06-07 | DRG: 064 | Disposition: A | Discharge status: Home / Self Care | Payer: Medicaid Other | Source: Other Acute Inpatient Hospital | Attending: Internal Medicine | Admitting: Internal Medicine

## 2013-06-03 DIAGNOSIS — E119 Type 2 diabetes mellitus without complications: Secondary | ICD-10-CM | POA: Diagnosis present

## 2013-06-03 DIAGNOSIS — Z794 Long term (current) use of insulin: Secondary | ICD-10-CM

## 2013-06-03 DIAGNOSIS — Z8673 Personal history of transient ischemic attack (TIA), and cerebral infarction without residual deficits: Secondary | ICD-10-CM

## 2013-06-03 DIAGNOSIS — I509 Heart failure, unspecified: Secondary | ICD-10-CM | POA: Diagnosis present

## 2013-06-03 DIAGNOSIS — I2510 Atherosclerotic heart disease of native coronary artery without angina pectoris: Secondary | ICD-10-CM | POA: Diagnosis present

## 2013-06-03 DIAGNOSIS — G9349 Other encephalopathy: Secondary | ICD-10-CM | POA: Diagnosis present

## 2013-06-03 DIAGNOSIS — E213 Hyperparathyroidism, unspecified: Secondary | ICD-10-CM | POA: Diagnosis present

## 2013-06-03 DIAGNOSIS — I639 Cerebral infarction, unspecified: Secondary | ICD-10-CM | POA: Diagnosis present

## 2013-06-03 DIAGNOSIS — D473 Essential (hemorrhagic) thrombocythemia: Secondary | ICD-10-CM | POA: Diagnosis present

## 2013-06-03 DIAGNOSIS — D649 Anemia, unspecified: Secondary | ICD-10-CM | POA: Diagnosis present

## 2013-06-03 DIAGNOSIS — E785 Hyperlipidemia, unspecified: Secondary | ICD-10-CM | POA: Diagnosis present

## 2013-06-03 DIAGNOSIS — F319 Bipolar disorder, unspecified: Secondary | ICD-10-CM | POA: Diagnosis present

## 2013-06-03 DIAGNOSIS — E1121 Type 2 diabetes mellitus with diabetic nephropathy: Secondary | ICD-10-CM

## 2013-06-03 DIAGNOSIS — J189 Pneumonia, unspecified organism: Secondary | ICD-10-CM | POA: Diagnosis present

## 2013-06-03 DIAGNOSIS — I6789 Other cerebrovascular disease: Secondary | ICD-10-CM

## 2013-06-03 DIAGNOSIS — I6350 Cerebral infarction due to unspecified occlusion or stenosis of unspecified cerebral artery: Principal | ICD-10-CM | POA: Diagnosis present

## 2013-06-03 DIAGNOSIS — I10 Essential (primary) hypertension: Secondary | ICD-10-CM | POA: Diagnosis present

## 2013-06-03 DIAGNOSIS — M129 Arthropathy, unspecified: Secondary | ICD-10-CM | POA: Diagnosis present

## 2013-06-03 DIAGNOSIS — I739 Peripheral vascular disease, unspecified: Secondary | ICD-10-CM | POA: Diagnosis present

## 2013-06-03 DIAGNOSIS — N184 Chronic kidney disease, stage 4 (severe): Secondary | ICD-10-CM

## 2013-06-03 DIAGNOSIS — E1165 Type 2 diabetes mellitus with hyperglycemia: Secondary | ICD-10-CM

## 2013-06-03 DIAGNOSIS — I672 Cerebral atherosclerosis: Secondary | ICD-10-CM | POA: Diagnosis present

## 2013-06-03 DIAGNOSIS — N186 End stage renal disease: Secondary | ICD-10-CM | POA: Diagnosis present

## 2013-06-03 DIAGNOSIS — F0150 Vascular dementia without behavioral disturbance: Secondary | ICD-10-CM | POA: Diagnosis present

## 2013-06-03 DIAGNOSIS — IMO0001 Reserved for inherently not codable concepts without codable children: Secondary | ICD-10-CM

## 2013-06-03 DIAGNOSIS — I120 Hypertensive chronic kidney disease with stage 5 chronic kidney disease or end stage renal disease: Secondary | ICD-10-CM | POA: Diagnosis present

## 2013-06-03 DIAGNOSIS — Z992 Dependence on renal dialysis: Secondary | ICD-10-CM

## 2013-06-03 DIAGNOSIS — G9340 Encephalopathy, unspecified: Secondary | ICD-10-CM

## 2013-06-03 DIAGNOSIS — H539 Unspecified visual disturbance: Secondary | ICD-10-CM | POA: Diagnosis present

## 2013-06-03 DIAGNOSIS — Z7982 Long term (current) use of aspirin: Secondary | ICD-10-CM

## 2013-06-03 LAB — CREATININE, SERUM
CREATININE: 7.3 mg/dL — AB (ref 0.50–1.10)
GFR calc non Af Amer: 5 mL/min — ABNORMAL LOW (ref >90)
GFR, EST AFRICAN AMERICAN: 6 mL/min — AB (ref >90)

## 2013-06-03 LAB — CBC
HCT: 35.7 % — ABNORMAL LOW (ref 36.0–46.0)
Hemoglobin: 12.5 g/dL (ref 12.0–15.0)
MCH: 31.6 pg (ref 26.0–34.0)
MCHC: 35 g/dL (ref 30.0–36.0)
MCV: 90.2 fL (ref 78.0–100.0)
PLATELETS: 640 10*3/uL — AB (ref 150–400)
RBC: 3.96 MIL/uL (ref 3.87–5.11)
RDW: 13.8 % (ref 11.5–15.5)
WBC: 14.1 10*3/uL — AB (ref 4.0–10.5)

## 2013-06-03 LAB — GLUCOSE, CAPILLARY
GLUCOSE-CAPILLARY: 87 mg/dL (ref 70–99)
Glucose-Capillary: 80 mg/dL (ref 70–99)
Glucose-Capillary: 159 mg/dL — ABNORMAL HIGH (ref 70–99)
Glucose-Capillary: 145 mg/dL — ABNORMAL HIGH (ref 70–99)
Glucose-Capillary: 153 mg/dL — ABNORMAL HIGH (ref 70–99)

## 2013-06-03 MED ORDER — INSULIN ASPART 100 UNIT/ML ~~LOC~~ SOLN
0.0000 [IU] | Freq: Every day | SUBCUTANEOUS | Status: DC
  Administered 2013-06-04: 4 [IU] via SUBCUTANEOUS
  Administered 2013-06-05: 2 [IU] via SUBCUTANEOUS

## 2013-06-03 MED ORDER — INSULIN DETEMIR 100 UNIT/ML FLEXPEN: 14.0000 [IU] | PEN_INJECTOR | Freq: Every day | SUBCUTANEOUS | Status: DC

## 2013-06-03 MED ORDER — HYDRALAZINE HCL 20 MG/ML IJ SOLN
10.0000 mg | Freq: Four times a day (QID) | INTRAMUSCULAR | Status: DC | PRN
  Administered 2013-06-03: 10 mg via INTRAVENOUS
  Filled 2013-06-03: qty 1

## 2013-06-03 MED ORDER — HALOPERIDOL LACTATE 5 MG/ML IJ SOLN
2.0000 mg | Freq: Four times a day (QID) | INTRAMUSCULAR | Status: DC | PRN
  Administered 2013-06-03 – 2013-06-04 (×2): 2 mg via INTRAVENOUS
  Filled 2013-06-03 (×2): qty 1

## 2013-06-03 MED ORDER — RENA-VITE PO TABS
1.0000 | ORAL_TABLET | Freq: Every day | ORAL | Status: DC
  Administered 2013-06-03 – 2013-06-04 (×2): 1 via ORAL
  Administered 2013-06-05: 22:00:00 via ORAL
  Administered 2013-06-06: 1 via ORAL
  Filled 2013-06-03 (×5): qty 1

## 2013-06-03 MED ORDER — INSULIN DETEMIR 100 UNIT/ML ~~LOC~~ SOLN
14.0000 [IU] | Freq: Every day | SUBCUTANEOUS | Status: DC
  Administered 2013-06-03 – 2013-06-06 (×4): 14 [IU] via SUBCUTANEOUS
  Filled 2013-06-03 (×6): qty 0.14

## 2013-06-03 MED ORDER — ASPIRIN EC 81 MG PO TBEC
81.0000 mg | DELAYED_RELEASE_TABLET | Freq: Every day | ORAL | Status: DC
Start: 2013-06-03 — End: 2013-06-06
  Administered 2013-06-03 – 2013-06-06 (×4): 81 mg via ORAL
  Filled 2013-06-03 (×4): qty 1

## 2013-06-03 MED ORDER — ACETAMINOPHEN 325 MG PO TABS
650.0000 mg | ORAL_TABLET | Freq: Four times a day (QID) | ORAL | Status: DC | PRN
  Administered 2013-06-03 – 2013-06-07 (×3): 650 mg via ORAL
  Filled 2013-06-03 (×3): qty 2

## 2013-06-03 MED ORDER — CALCIUM ACETATE 667 MG PO CAPS
667.0000 mg | ORAL_CAPSULE | Freq: Three times a day (TID) | ORAL | Status: DC
  Administered 2013-06-03 – 2013-06-07 (×10): 667 mg via ORAL
  Filled 2013-06-03 (×15): qty 1

## 2013-06-03 MED ORDER — SENNOSIDES-DOCUSATE SODIUM 8.6-50 MG PO TABS
1.0000 | ORAL_TABLET | Freq: Every evening | ORAL | Status: DC | PRN
  Filled 2013-06-03: qty 1

## 2013-06-03 MED ORDER — INSULIN ASPART 100 UNIT/ML ~~LOC~~ SOLN: 0.0000 [IU] | Freq: Three times a day (TID) | SUBCUTANEOUS | Status: DC

## 2013-06-03 MED ORDER — INSULIN ASPART 100 UNIT/ML ~~LOC~~ SOLN
0.0000 [IU] | Freq: Three times a day (TID) | SUBCUTANEOUS | Status: DC
  Administered 2013-06-03: 2 [IU] via SUBCUTANEOUS
  Administered 2013-06-04: 1 [IU] via SUBCUTANEOUS
  Administered 2013-06-05: 3 [IU] via SUBCUTANEOUS
  Administered 2013-06-05: 1 [IU] via SUBCUTANEOUS
  Administered 2013-06-05: 5 [IU] via SUBCUTANEOUS
  Administered 2013-06-06: 1 [IU] via SUBCUTANEOUS
  Administered 2013-06-06: 2 [IU] via SUBCUTANEOUS
  Administered 2013-06-07: 1 [IU] via SUBCUTANEOUS

## 2013-06-03 MED ORDER — CALCIUM ACETATE 667 MG PO CAPS
667.0000 mg | ORAL_CAPSULE | Freq: Three times a day (TID) | ORAL | Status: DC
  Filled 2013-06-03 (×3): qty 1

## 2013-06-03 MED ORDER — DOXERCALCIFEROL 4 MCG/2ML IV SOLN
1.0000 ug | INTRAVENOUS | Status: DC
  Administered 2013-06-04 – 2013-06-06 (×2): 1 ug via INTRAVENOUS
  Filled 2013-06-03 (×2): qty 2

## 2013-06-03 MED ORDER — HEPARIN SODIUM (PORCINE) 5000 UNIT/ML IJ SOLN
5000.0000 [IU] | Freq: Three times a day (TID) | INTRAMUSCULAR | Status: DC
  Administered 2013-06-03 – 2013-06-07 (×11): 5000 [IU] via SUBCUTANEOUS
  Filled 2013-06-03 (×15): qty 1

## 2013-06-03 NOTE — Progress Notes (Signed)
  Echocardiogram 2D Echocardiogram has been performed.  Donata Clay 06/03/2013, 12:20 PM

## 2013-06-03 NOTE — Consult Note (Signed)
Renal Service Consult Note [facility]  ROYALTI SCHAUF 06/03/2013 [place] Requesting Physician:  Dr Eliseo Squires  Reason for Consult:  ESRD pt with R foot pain and confusion HPI: The patient is a 63 y.o. year-old with DM, HTN, ESRD sent to ED with AMS for 2 days.  Just released from [facility] on 3/2 for AMS and missed HD.  Pt gets HD on TTS schedule in [place].  Head CT at [facility] [place] ED showed multiple infarcts old.  Pt admitted early today.    Pt's only complaint is severe pain in the R foot, especially the R great toe. Not sure how long this has been going on.  She is not oriented to time or place but converses appropriately.    Pt was admitted here 2/21 > 05/27/13 for confusion and missed HD.  Also n/v.  She had dialysis and MS gradually improved but pt was not fully oriented but felt to be back to baseline. She was noted to have bipolar disorder and was given haldol.  Blood cx's were negative.  She had HCAP with LLL infiltrate treated with IV abx.  She had ischemic changes to 1st and 2nd digits on R foot, ABI's showed severe PVD on the R.  F/u w [facility] in 2 wks was arranged.  She had severe malnutrition and was d/c'd to a SNF for rehabilitation.   ROS  no cp, sob or cough  no abd pain, n/v/d  no skin rash  no jt pain  no HA  Past Medical History  Past Medical History  Diagnosis Date  . Diabetes mellitus   . Hypertension   . CHF (congestive heart failure)   . Hyperlipidemia   . End-stage renal disease on hemodialysis     Started dialysis in late 2014. Gets HD in [place] on TTS schedule.     . Thyroid disease   . Depression   . Shortness of breath     at night  . Arthritis   . Dementia   . Stroke     2013, weakness- all over  . Constipation   . Vision disturbance   . End stage renal disease 03/30/2011   Past Surgical History  Past Surgical History  Procedure Laterality Date  . Arteriovenous graft placement      left upper extremity  . Tubal ligation      x2  .  Av fistula placement Left 07/02/2012    Procedure: ARTERIOVENOUS (AV) FISTULA CREATION;  Surgeon: Conrad Hollins, MD;  Location: [place];  Service: Vascular;  Laterality: Left;  . Av fistula placement Left 10/01/2012    Procedure: INSERTION OF ARTERIOVENOUS (AV) GORE-TEX GRAFT LEFT UPPER ARM;  Surgeon: Conrad [place], MD;  Location: [facility];  Service: Vascular;  Laterality: Left;  Ultrasound guided   Family History  Family History  Problem Relation Age of Onset  . Diabetes Father   . Stroke Father   . Heart disease Mother    Social History  reports that she has never smoked. She has never used smokeless tobacco. She reports that she does not drink alcohol or use illicit drugs. Allergies No Known Allergies Home medications Prior to Admission medications   Medication Sig Start Date End Date Taking? Authorizing Provider  amLODipine (NORVASC) 5 MG tablet Take 1 tablet (5 mg total) by mouth at bedtime. 05/28/13   Maryann Mikhail, DO  amLODipine (NORVASC) 5 MG tablet Take 1 tablet (5 mg total) by mouth daily. 05/27/13   Cristal Ford,  DO  aspirin EC 81 MG tablet Take 81 mg by mouth daily.    Historical Provider, MD  calcium acetate (PHOSLO) 667 MG capsule Take 1 capsule (667 mg total) by mouth 3 (three) times daily with meals. 05/24/13   Maryann Mikhail, DO  Insulin Detemir (LEVEMIR FLEXPEN) 100 UNIT/ML Pen Inject 14 Units into the skin daily at 10 pm. 05/27/13   Velta Addison Mikhail, DO  multivitamin (RENA-VIT) TABS tablet Take 1 tablet by mouth at bedtime. 05/24/13   Maryann Mikhail, DO   Liver Function Tests No results found for this basename: AST, ALT, ALKPHOS, BILITOT, PROT, ALBUMIN,  in the last 168 hours No results found for this basename: LIPASE, AMYLASE,  in the last 168 hours CBC No results found for this basename: WBC, NEUTROABS, HGB, HCT, MCV, PLT,  in the last 168 hours Basic Metabolic Panel No results found for this basename: NA, K, CL, CO2, GLUCOSE, BUN, CREATININE, ALB, CALCIUM, PHOS,  in the last  168 hours  Filed Vitals:   06/03/13 0751 06/03/13 0800 06/03/13 0915 06/03/13 1113  BP: 177/52   160/71  Pulse: 81   84  Temp: 97.3 F (36.3 C) 97.4 F (36.3 C)    TempSrc: Axillary     Resp: 20   15  Height:   5\' 3"  (1.6 m)   Weight:   58.2 kg (128 lb 4.9 oz)   SpO2: 97%   100%   Exam: Alert adult female, no distress, fully responsive, cries out in pain from time to time No rash, cyanosis or gangrene Sclera anicteric, throat clear No JVD Chest clear bilat RRR no MRG Abd soft, scaphoid, NT, ND RLE no edema, mild darkening of skin over medial R foot, very tender to minimal palpation of great toe on R, no drainage or ulcer; LLE no edema Neuro "1950"  "hospital in [place]?", grossly nonfocal  Dialysis: TTS [place]  3h 2min 58 kg 2K/2.0 Bath Heparin 6000 L arm AVG  Hectorol 1 Epo 1200 Venofer none    Assessment: 1 AMS- pt has dementia and appears at baseline to me, conversant and appropriate but not fully oriented 2 R foot pain- seen by [facility] last admit, prob ischemic pain; per primary 3 ESRD on hemodialysis 4 HTN norvasc 5, BP's up , no vol excess 5 Anemia labs pending 6 2HPT cont phoslo ac, vit D 7 Dementia, multiinfarct 8 Hx bipolar disorder- no meds listed  9 DM on insulin   Plan- HD tomorrow, UF 2-3 kg   Kelly Splinter MD (pgr) [phone]    (c) [phone] 06/03/2013, 11:39 AM

## 2013-06-03 NOTE — Progress Notes (Signed)
Utilization Review Completed Ronnisha Felber J. Massie Cogliano, RN, BSN, NCM [phone]  

## 2013-06-03 NOTE — H&P (Signed)
[facility] History and Physical  [name] DOB: October 29, 1950 DOA: 06/03/2013  Referring physician: [facility] PCP: Marco Collie, MD   Chief Complaint: confusion  HPI: [name] is a 63 y.o. female who presented to ER with AMS for 2-3 days.   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   Per records at ER in [place], family statesL patient has not been conversing and has been "nearly" unconscious for several days.  It was also reported, patient has bipolar disorder and was recently placed on haldol Past medical history of end-stage renal disease dialyzes Tuesday Thursdays and Saturdays, diabetes hypertension and coronary artery disease.    CT scan done in [place], does show multiple infarcts of indeterminate age Labs show a mildly elevated WBC count.  Myoglobin was also elevated. K was 4  Patient will awaken-  Says 1990, no CP no SOB, no fever, no chills- does not know where she is   Review of Systems:  Unable to obtain as patient cannot cannot provide any further details.  Past Medical History  Diagnosis Date  . Diabetes mellitus   . Hypertension   . CHF (congestive heart failure)   . Hyperlipidemia   . End-stage renal disease on hemodialysis     Started dialysis in late 2014. Gets HD in [place] on TTS schedule.     . Thyroid disease   . Depression   . Shortness of breath     at night  . Arthritis   . Dementia   . Stroke     2013, weakness- all over  . Constipation   . Vision disturbance   . End stage renal disease 03/30/2011   Past Surgical History  Procedure Laterality Date  . Arteriovenous graft placement      left upper extremity  . Tubal ligation      x2  . Av fistula placement Left 07/02/2012    Procedure: ARTERIOVENOUS (AV) FISTULA CREATION;  Surgeon: Conrad Rome, MD;  Location: [facility];  Service: Vascular;  Laterality: Left;  . Av fistula placement Left 10/01/2012    Procedure: INSERTION OF ARTERIOVENOUS (AV) GORE-TEX GRAFT LEFT UPPER ARM;  Surgeon: Conrad Firebaugh, MD;  Location: [facility];  Service: Vascular;  Laterality: Left;  Ultrasound guided   Social History:  reports that she has never smoked. She has never used smokeless tobacco. She reports that she does not drink alcohol or use illicit drugs.  No Known Allergies  Family History  Problem Relation Age of Onset  . Diabetes Father   . Stroke Father   . Heart disease Mother      Prior to Admission medications   Medication Sig Start Date End Date Taking? Authorizing Provider  amitriptyline (ELAVIL) 25 MG tablet Take 25 mg by mouth at bedtime.    Historical Provider, MD  amLODipine (NORVASC) 5 MG tablet Take 5 mg by mouth daily.      Historical Provider, MD  aspirin 81 MG tablet Take 81 mg by mouth daily.      Historical Provider, MD  calcitRIOL (ROCALTROL) 0.25 MCG capsule Take 0.25 mcg by mouth daily.      Historical Provider, MD  carvedilol (COREG) 25 MG tablet Take 25 mg by mouth 2 (two) times daily with a meal.      Historical Provider, MD  furosemide (LASIX) 80 MG tablet Take 80 mg by mouth daily.      Historical  Provider, MD  gabapentin (NEURONTIN) 600 MG tablet Take 600 mg by mouth 3 (three) times daily.      Historical Provider, MD  hydrALAZINE (APRESOLINE) 50 MG tablet Take 50 mg by mouth 3 (three) times daily.    Historical Provider, MD  Insulin Glargine (LANTUS [place]) Inject 20 Units into the skin at bedtime.      Historical Provider, MD  oxyCODONE (ROXICODONE) 5 MG immediate release tablet Take 1-2 tablets (5-10 mg total) by mouth every 4 (four) hours as needed for pain. 10/01/12   Regina J Roczniak, PA-C  quinapril (ACCUPRIL) 20 MG tablet Take 20 mg by mouth daily.    Historical Provider, MD   Physical Exam: Filed Vitals:   06/03/13 0751  BP: 177/52  Pulse: 81  Temp: 97.3 F (36.3 C)  Resp: 20    BP 177/52  Pulse 81  Temp(Src) 97.3 F (36.3 C) (Axillary)  Resp 20  SpO2  97%  General:  Appears calm and comfortable- will awake but falls back to sleep almost immediately Eyes: PERRL, normal lids, irises & conjunctiva ENT: grossly normal hearing, lips & tongue, + JVD Neck: no LAD, masses or thyromegaly Cardiovascular: RRR, no rubs or murmurs Respiratory: CTA bilaterally, no w/r/r. Normal respiratory effort. Abdomen: soft, ntnd Musculoskeletal: grossly normal tone BUE/BLE Psychiatric: grossly normal mood and affect, speech fluent and appropriate when awake Neurologic: grossly non-focal.- moves all 4 ext- no facial drooping          Labs on Admission:  Basic Metabolic Panel: No results found for this basename: NA, K, CL, CO2, GLUCOSE, BUN, CREATININE, CALCIUM, MG, PHOS,  in the last 168 hours Liver Function Tests: No results found for this basename: AST, ALT, ALKPHOS, BILITOT, PROT, ALBUMIN,  in the last 168 hours No results found for this basename: LIPASE, AMYLASE,  in the last 168 hours No results found for this basename: AMMONIA,  in the last 168 hours CBC: No results found for this basename: WBC, NEUTROABS, HGB, HCT, MCV, PLT,  in the last 168 hours Cardiac Enzymes: No results found for this basename: CKTOTAL, CKMB, CKMBINDEX, TROPONINI,  in the last 168 hours  BNP (last 3 results) No results found for this basename: PROBNP,  in the last 8760 hours CBG:  Recent Labs Lab 05/27/13 1132 05/27/13 1628  GLUCAP 164* 273*    Radiological Exams on Admission: No results found.  EKG: Independently reviewed. pending  Assessment/Plan Multiple infarcts on CT at [facility]-  -get MRI- CT from 2/15 did not show any infarcts- IF MRI + will consult neuro -? Age of infarcts -tele -FLP, HgbA1C -neuro checks -stroke swallow screen  AMS due to Uremic encephalopathy/ End stage renal disease: -renal for dialysis -?seizures and postictal states- will get EEG - K is WNL -last dialysis per patient was Saturday  Hypertension: - resume home  meds.  Diabetes mellitus/ diabetic nephropathy: - SSI. -levemir from home  -not sure if patient can tolerated dialysis - consider goals of care based on work up  Code Status: full Family Communication: no family at bedside Disposition Plan: inpatient  Time spent: 75 minutes  Eulogio Bear [facility] Pager [phone]

## 2013-06-04 DIAGNOSIS — I6350 Cerebral infarction due to unspecified occlusion or stenosis of unspecified cerebral artery: Secondary | ICD-10-CM

## 2013-06-04 LAB — GLUCOSE, CAPILLARY
GLUCOSE-CAPILLARY: 84 mg/dL (ref 70–99)
Glucose-Capillary: 128 mg/dL — ABNORMAL HIGH (ref 70–99)

## 2013-06-04 LAB — HEPATIC FUNCTION PANEL
ALK PHOS: 121 U/L — AB (ref 39–117)
ALT: 25 U/L (ref 0–35)
AST: 16 U/L (ref 0–37)
Albumin: 3 g/dL — ABNORMAL LOW (ref 3.5–5.2)
Bilirubin, Direct: <0.2 mg/dL (ref 0.0–0.3)
Total Bilirubin: <0.2 mg/dL — ABNORMAL LOW (ref 0.3–1.2)
Total Protein: 7.5 g/dL (ref 6.0–8.3)

## 2013-06-04 LAB — RENAL FUNCTION PANEL
ALBUMIN: 3 g/dL — AB (ref 3.5–5.2)
BUN: 52 mg/dL — AB (ref 6–23)
CO2: 25 mEq/L (ref 19–32)
Calcium: 10 mg/dL (ref 8.4–10.5)
Chloride: 95 mEq/L — ABNORMAL LOW (ref 96–112)
Creatinine, Ser: 8.84 mg/dL — ABNORMAL HIGH (ref 0.50–1.10)
GFR calc non Af Amer: 4 mL/min — ABNORMAL LOW (ref >90)
GFR, EST AFRICAN AMERICAN: 5 mL/min — AB (ref >90)
Glucose, Bld: 235 mg/dL — ABNORMAL HIGH (ref 70–99)
PHOSPHORUS: 4.9 mg/dL — AB (ref 2.3–4.6)
POTASSIUM: 4.4 meq/L (ref 3.7–5.3)
SODIUM: 140 meq/L (ref 137–147)

## 2013-06-04 LAB — LIPID PANEL
CHOL/HDL RATIO: 6.8 ratio
Cholesterol: 216 mg/dL — ABNORMAL HIGH (ref 0–200)
HDL: 32 mg/dL — ABNORMAL LOW (ref >39)
LDL Cholesterol: 131 mg/dL — ABNORMAL HIGH (ref 0–99)
Triglycerides: 267 mg/dL — ABNORMAL HIGH (ref <150)
VLDL: 53 mg/dL — ABNORMAL HIGH (ref 0–40)

## 2013-06-04 LAB — HEMOGLOBIN A1C
HEMOGLOBIN A1C: 8 % — AB (ref <5.7)
Mean Plasma Glucose: 183 mg/dL — ABNORMAL HIGH (ref <117)

## 2013-06-04 MED ORDER — DOXERCALCIFEROL 4 MCG/2ML IV SOLN
INTRAVENOUS | Status: AC
  Administered 2013-06-04: 1 ug via INTRAVENOUS
  Filled 2013-06-04: qty 2

## 2013-06-04 MED ORDER — LIDOCAINE HCL (PF) 1 % IJ SOLN: 5.0000 mL | INTRAMUSCULAR | Status: DC | PRN

## 2013-06-04 MED ORDER — AMLODIPINE BESYLATE 5 MG PO TABS
5.0000 mg | ORAL_TABLET | Freq: Every day | ORAL | Status: DC
  Administered 2013-06-05 – 2013-06-06 (×3): 5 mg via ORAL
  Filled 2013-06-04 (×5): qty 1

## 2013-06-04 MED ORDER — ALTEPLASE 2 MG IJ SOLR
2.0000 mg | Freq: Once | INTRAMUSCULAR | Status: AC | PRN
  Filled 2013-06-04: qty 2

## 2013-06-04 MED ORDER — NEPRO/CARBSTEADY PO LIQD
237.0000 mL | ORAL | Status: DC | PRN
  Filled 2013-06-04: qty 237

## 2013-06-04 MED ORDER — HEPARIN SODIUM (PORCINE) 1000 UNIT/ML DIALYSIS
6000.0000 [IU] | Freq: Once | INTRAMUSCULAR | Status: AC
  Administered 2013-06-04: 6000 [IU] via INTRAVENOUS_CENTRAL
  Filled 2013-06-04: qty 6

## 2013-06-04 MED ORDER — SODIUM CHLORIDE 0.9 % IV SOLN: 100.0000 mL | INTRAVENOUS | Status: DC | PRN

## 2013-06-04 MED ORDER — SIMVASTATIN 20 MG PO TABS: 20.0000 mg | ORAL_TABLET | Freq: Every day | ORAL | Status: DC

## 2013-06-04 MED ORDER — SIMVASTATIN 20 MG PO TABS
20.0000 mg | ORAL_TABLET | Freq: Every day | ORAL | Status: DC
  Administered 2013-06-05 – 2013-06-06 (×2): 20 mg via ORAL
  Filled 2013-06-04 (×3): qty 1

## 2013-06-04 MED ORDER — LIDOCAINE-PRILOCAINE 2.5-2.5 % EX CREA
1.0000 "application " | TOPICAL_CREAM | CUTANEOUS | Status: DC | PRN
  Filled 2013-06-04: qty 5

## 2013-06-04 MED ORDER — HEPARIN SODIUM (PORCINE) 1000 UNIT/ML DIALYSIS
1000.0000 [IU] | INTRAMUSCULAR | Status: DC | PRN
  Filled 2013-06-04: qty 1

## 2013-06-04 MED ORDER — PENTAFLUOROPROP-TETRAFLUOROETH EX AERO: 1.0000 "application " | INHALATION_SPRAY | CUTANEOUS | Status: DC | PRN

## 2013-06-04 NOTE — Progress Notes (Signed)
[facility] Progress Note [place] TEAM 1 - Stepdown/ICU TEAM   RAMYAH PANKOWSKI JIR:[id number] DOB: February 05, 1951 DOA: 06/03/2013 PCP: Marco Collie, MD  Brief narrative: JAKEYA GHERARDI is a 63 y.o. female presenting on 06/03/2013 with  has a past medical history of Diabetes mellitus; Hypertension; CHF (congestive heart failure); Hyperlipidemia; End-stage renal disease on hemodialysis; Thyroid disease; Depression; Shortness of breath; Arthritis; Dementia; Stroke; Constipation; Vision disturbance; and End stage renal disease (03/30/2011) who presents with altered mental status.   Subjective: She is alert now after dialysis and is able to tell me that she missed her dialysis as her transportation did not come.   Assessment/Plan: Principal Problem:   Acute encephalopathy - resolved w dialysis  - Mri done and pending   Active Problems:   Chronic kidney disease, stage IV (severe) - per nephrology    Type II or unspecified type diabetes mellitus without mention of complication, not stated as uncontrolled - controlled currently  Hyperlipidemia - start Zocor  Leukocytosis - no signs of infection - repeat   Code Status: full code Family Communication: none Disposition Plan: home- tx to med/surg- keep on team 1  Consultants: nephrology  Procedures: none  Antibiotics: Antibiotics Given (last 72 hours)   None       DVT prophylaxis: heparin  Objective: Filed Weights   06/03/13 0915 06/04/13 1241 06/04/13 1640  Weight: 58.2 kg (128 lb 4.9 oz) 58.4 kg (128 lb 12 oz) 56.9 kg (125 lb 7.1 oz)   Blood pressure 149/62, pulse 114, temperature 98.7 F (37.1 C), temperature source Oral, resp. rate 26, height 5\' 3"  (1.6 m), weight 56.9 kg (125 lb 7.1 oz), SpO2 100.00%.  Intake/Output Summary (Last 24 hours) at 06/04/13 1800 Last data filed at 06/04/13 1640  Gross per 24 hour  Intake    720 ml  Output   1650 ml  Net   -930 ml     Exam: General: No acute respiratory  distress Lungs: Clear to auscultation bilaterally without wheezes or crackles Cardiovascular: Regular rate and rhythm without murmur gallop or rub normal S1 and S2 Abdomen: Nontender, nondistended, soft, bowel sounds positive, no rebound, no ascites, no appreciable mass Extremities: No significant cyanosis, clubbing, or edema bilateral lower extremities  Data Reviewed: Basic Metabolic Panel:  Recent Labs Lab 06/03/13 1245 06/04/13 1130  NA  --  140  K  --  4.4  CL  --  95*  CO2  --  25  GLUCOSE  --  235*  BUN  --  52*  CREATININE 7.30* 8.84*  CALCIUM  --  10.0  PHOS  --  4.9*   Liver Function Tests:  Recent Labs Lab 06/04/13 1130  AST 16  ALT 25  ALKPHOS 121*  BILITOT <0.2*  PROT 7.5  ALBUMIN 3.0*  3.0*   No results found for this basename: LIPASE, AMYLASE,  in the last 168 hours No results found for this basename: AMMONIA,  in the last 168 hours CBC:  Recent Labs Lab 06/03/13 1245  WBC 14.1*  HGB 12.5  HCT 35.7*  MCV 90.2  PLT 640*   Cardiac Enzymes: No results found for this basename: CKTOTAL, CKMB, CKMBINDEX, TROPONINI,  in the last 168 hours BNP (last 3 results) No results found for this basename: PROBNP,  in the last 8760 hours CBG:  Recent Labs Lab 06/03/13 1657 06/03/13 1731 06/03/13 2049 06/04/13 0813 06/04/13 1706  GLUCAP 159* 145* 153* 84 128*    No results found for this or any  previous visit (from the past 240 hour(s)).   Studies:  Recent x-ray studies have been reviewed in detail by the Attending Physician  Scheduled Meds:  Scheduled Meds: . aspirin EC  81 mg Oral Daily  . calcium acetate  667 mg Oral TID WC  . doxercalciferol  1 mcg Intravenous Q T,Th,Sa-HD  . heparin  5,000 Units Subcutaneous 3 times per day  . insulin aspart  0-5 Units Subcutaneous QHS  . insulin aspart  0-9 Units Subcutaneous TID WC  . insulin detemir  14 Units Subcutaneous QHS  . multivitamin  1 tablet Oral QHS   Continuous Infusions:   Time spent  on care of this patient: >35 min   Debbe Odea, MD  [facility] Office  [phone] Pager - Text Page per Shea Evans as per below:  On-Call/Text Page:      [email or web address]  If 7PM-7AM, please contact night-coverage [email or web address] 06/04/2013, 6:00 PM   LOS: 1 day
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
## 2013-06-04 NOTE — Progress Notes (Signed)
  [facility] Progress Note   Subjective: Up in chair  Filed Vitals:   06/03/13 2049 06/03/13 2346 06/04/13 0350 06/04/13 0740  BP: 175/64 179/62 171/75 187/63  Pulse: 106 113 114 106  Temp: 98.4 F (36.9 C) 98.8 F (37.1 C) 98.6 F (37 C) 98.7 F (37.1 C)  TempSrc: Oral Oral Oral Oral  Resp: 24 24 15 28   Height:      Weight:      SpO2: 100% 98% 99% 96%   Exam: UP in chair, fully awake and alert No JVD  Chest clear bilat  RRR no MRG  Abd soft, scaphoid, NT, ND  No LE or UE edema, no ulcer or gangrene Neuro nonfocal exam grossly, alert, responds appropriately   Dialysis: TTS [place]  3h 67min 58 kg 2K/2.0 [place] Heparin 6000 L arm AVG  Hectorol 1 Epo 1200 Venofer none   Assessment:  1 AMS- alert today, probably underlying mild dementia (multi-infarct) 2 Foot pain, bilat- ?ischemic  3 ESRD on hemodialysis  4 HTN norvasc 5, BP's up , at dry wt 5 Anemia Hb 12, hold epo 6 2HPT cont phoslo ac, vit D 8 Hx bipolar disorder- no meds listed  9 DM on insulin   Dialysis: HD today, UF 2kg today as tol, renal panel, LFT's with HD   Kelly Splinter MD  pager [phone]    cell [phone]  06/04/2013, 10:44 AM     Recent Labs Lab 06/03/13 1245  CREATININE 7.30*   No results found for this basename: AST, ALT, ALKPHOS, BILITOT, PROT, ALBUMIN,  in the last 168 hours  Recent Labs Lab 06/03/13 1245  WBC 14.1*  HGB 12.5  HCT 35.7*  MCV 90.2  PLT 640*   . aspirin EC  81 mg Oral Daily  . calcium acetate  667 mg Oral TID WC  . doxercalciferol  1 mcg Intravenous Q T,Th,Sa-HD  . heparin  5,000 Units Subcutaneous 3 times per day  . insulin aspart  0-5 Units Subcutaneous QHS  . insulin aspart  0-9 Units Subcutaneous TID WC  . insulin detemir  14 Units Subcutaneous QHS  . multivitamin  1 tablet Oral QHS     acetaminophen, haloperidol lactate, hydrALAZINE, senna-docusate

## 2013-06-04 NOTE — Clinical Documentation Improvement (Signed)
PLEASE CLARIFY TYPE AND ACUITY OF CHF Possible Clinical Conditions?  Chronic Systolic Congestive Heart Failure Chronic Diastolic Congestive Heart Failure Chronic Systolic & Diastolic Congestive Heart Failure Acute Systolic Congestive Heart Failure Acute Diastolic Congestive Heart Failure Acute Systolic & Diastolic Congestive Heart Failure Acute on Chronic Systolic Congestive Heart Failure Acute on Chronic Diastolic Congestive Heart Failure Acute on Chronic Systolic & Diastolic Congestive Heart Failure Other Condition Cannot Clinically Determine  Supporting Information: (As per notes)  "Pt has hx of CHF"   Thank You, Alessandra Grout, RN, BSN, CCDS, Clinical Documentation Specialist:  [phone]   Cell=[phone] [facility]- [facility] Information Management

## 2013-06-04 NOTE — Progress Notes (Signed)
SLP Cancellation Note  Patient Details Name: [name] MRN: [id number] DOB: Jul 31, 1950   Cancelled treatment:        Order rec'd for Speech-Language evaluation. Pt. Is from SNF with plans to d/c to SNF.  Defer Speech evaluation to SLP at SNF.   Quinn Axe T 06/04/2013, 1:32 PM

## 2013-06-04 NOTE — Clinical Social Work Note (Signed)
CSW has made several attempts to complete SNF assessment. Patient is not oriented at this time. CSW will continue to try and reach patient's family. Per PT patient stated that she is from a facility in [place]. CSW Risk manager and admission coordinator confirms that patient was at their facility. Admission coordinator states that facility would like to accept patient back at DC if family wants her to return there at DC. Admission coordinator Eustaquio Maize states that she will try to get in touch with the patient's family as well to notify them that CSW needs to speak with them.  Liz Beach, [place], [place], [phone]

## 2013-06-04 NOTE — Progress Notes (Signed)
OT Cancellation Note  Patient Details Name: [name] MRN: [id number] DOB: 14-Oct-1950   Cancelled Treatment:    Reason Eval/Treat Not Completed: Other (comment) PT from SNF and returning to SNF. Will defer OT to SNF. Please call [phone] or text page [phone] if eval needed for admission. [place], OTR/L  [phone] 06/04/2013 06/04/2013, 9:31 AM

## 2013-06-04 NOTE — Progress Notes (Signed)
Pt transferred to 6E08, receiving RN at bedside and aware of HR now in 120's. NP also aware and no new orders received. Pt family updated.

## 2013-06-04 NOTE — Progress Notes (Signed)
VASCULAR LAB PRELIMINARY  PRELIMINARY  PRELIMINARY  PRELIMINARY  Carotid duplex  completed.    Preliminary report:  Bilateral:  1-39% ICA stenosis.  Vertebral artery flow is antegrade.      Marvyn Torrez, RVT 06/04/2013, 11:11 AM

## 2013-06-04 NOTE — Progress Notes (Signed)
Pt HR sustaining in 130s NP [place] at bedside. Will change level of care to telemetry monitoring. Pt BP and other VSS. Will continue to monitor.

## 2013-06-05 ENCOUNTER — Inpatient Hospital Stay (HOSPITAL_COMMUNITY): Payer: Medicaid Other

## 2013-06-05 DIAGNOSIS — N184 Chronic kidney disease, stage 4 (severe): Secondary | ICD-10-CM

## 2013-06-05 LAB — GLUCOSE, CAPILLARY
GLUCOSE-CAPILLARY: 339 mg/dL — AB (ref 70–99)
GLUCOSE-CAPILLARY: 143 mg/dL — AB (ref 70–99)
GLUCOSE-CAPILLARY: 242 mg/dL — AB (ref 70–99)
Glucose-Capillary: 236 mg/dL — ABNORMAL HIGH (ref 70–99)
Glucose-Capillary: 243 mg/dL — ABNORMAL HIGH (ref 70–99)
Glucose-Capillary: 251 mg/dL — ABNORMAL HIGH (ref 70–99)

## 2013-06-05 LAB — CBC
HCT: 34.4 % — ABNORMAL LOW (ref 36.0–46.0)
Hemoglobin: 11.9 g/dL — ABNORMAL LOW (ref 12.0–15.0)
MCH: 31 pg (ref 26.0–34.0)
MCHC: 34.6 g/dL (ref 30.0–36.0)
MCV: 89.6 fL (ref 78.0–100.0)
PLATELETS: 619 10*3/uL — AB (ref 150–400)
RBC: 3.84 MIL/uL — ABNORMAL LOW (ref 3.87–5.11)
RDW: 14.2 % (ref 11.5–15.5)
WBC: 10 10*3/uL (ref 4.0–10.5)

## 2013-06-05 MED ORDER — INSULIN ASPART 100 UNIT/ML ~~LOC~~ SOLN
5.0000 [IU] | Freq: Three times a day (TID) | SUBCUTANEOUS | Status: DC
  Administered 2013-06-05 – 2013-06-07 (×5): 5 [IU] via SUBCUTANEOUS

## 2013-06-05 MED ORDER — NEPRO/CARBSTEADY PO LIQD
237.0000 mL | Freq: Two times a day (BID) | ORAL | Status: DC
  Administered 2013-06-05 – 2013-06-07 (×3): 237 mL via ORAL

## 2013-06-05 NOTE — Clinical Social Work Psychosocial (Signed)
Clinical Social Work Department BRIEF PSYCHOSOCIAL ASSESSMENT 06/05/2013  Patient:  [name], [name]     Account Number:  [id number]     Admit date:  06/03/2013  Clinical Social Worker:  Lovey Newcomer  Date/Time:  06/05/2013 08:30 AM  Referred by:  Physician  Date Referred:  06/05/2013 Referred for  SNF Placement   Other Referral:   Interview type:  Family Other interview type:   CSW spoke with [place] as patient was not oriented at time of assessment.    PSYCHOSOCIAL DATA Living Status:  ALONE Admitted from facility:  OTHER Level of care:  [place] Primary support name:  [place] Primary support relationship to patient:  CHILD, ADULT Degree of support available:   Support is good.    CURRENT CONCERNS Current Concerns  Post-Acute Placement   Other Concerns:    SOCIAL WORK ASSESSMENT / PLAN CSW received call from [place]. Tobie Poet states that patient was at [facility] prior to this hospital admission. Tobie Poet states that patient lives alone and home and family would be very concerned if patient returned home alone and feels that patient needs to return to [facility]. CSW has confirmed with facility that patient is from [facility] [place]. Admission coordinator states that CSW needs to keep facility updated on when/if patient will possibly be returning as the family has not held the bed for patient. Per PT, patient stated that she wants to go home with home but PT states this would not be a safe DC plan without 24hr supervision due to the patient's deficits in mobility and cognition. Patient is set for transfer to [facility], [facility] has left voicemail with 6E CSW to provide handoff report.   Assessment/plan status:  Psychosocial Support/Ongoing Assessment of Needs Other assessment/ plan:   Complete FL2, Fax, PASRR   Information/referral to community resources:   CSW contact information given to [place].     PATIENT'S/FAMILY'S RESPONSE TO PLAN OF CARE: Patient states that she wants to return home with home health services. Patient's granddaughter Tobie Poet states that family feels like patient needs to return to SNF before returning home. Tobie Poet was pleasant, appropriate, appreciative of CSW contact. CSW will assist with DC needs if appropriate.       Liz Beach, [place], [place], [phone]

## 2013-06-05 NOTE — Progress Notes (Signed)
Physical Therapy Treatment Patient Details Name: KYANN HEYDT MRN: [id number] DOB: 1950-08-05 Today's Date: 06/05/2013 Time: 1100-1210 PT Time Calculation (min): 70 min  PT Assessment / Plan / Recommendation  History of Present Illness MIGUELINA FORE is a 63 y.o. female who presented to ER with AMS for 2-3 days.  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      PT Comments   Patient continues to need fair amount of assistance and takes awhile to walk across the room to bathroom.  Spoke with Mignon Pine, pt's granddaughter who reports pt will have capable 24 hour assist.  Feel if she can get HHPT, wheelchair and 3:1 may be okay to return home with family assist.  Granddaughter also stated she had aide 2 hours a day [facility].  Follow Up Recommendations  Home health PT;Supervision/Assistance - 24 hour           Equipment Recommendations  Wheelchair (measurements PT);3in1 (PT)    Recommendations for Other Services  OT  Frequency Min 3X/week   Progress towards PT Goals Progress towards PT goals: Progressing toward goals  Plan Discharge plan needs to be updated;Frequency needs to be updated    Precautions / Restrictions Precautions Precautions: Fall   Pertinent Vitals/Pain No pain complaints    Mobility  Bed Mobility Overal bed mobility: Needs Assistance Bed Mobility: Supine to Sit Supine to sit: Mod assist;HOB elevated General bed mobility comments: with rail to lift trunk, assist to scoot to edge of bed and get legs off bed Transfers Overall transfer level: Needs assistance Equipment used: Rolling walker (2 wheeled) Transfers: Sit to/from Stand Sit to Stand: Mod assist General transfer comment: increased time to come fully upright needs cues for hip extension  Ambulation/Gait Ambulation/Gait assistance: Min assist;Mod assist Ambulation Distance (Feet): 25 Feet (and 15') Assistive device:  Rolling walker (2 wheeled) Gait Pattern/deviations: Step-to pattern;Step-through pattern;Trunk flexed;Shuffle;Wide base of support;Decreased stride length General Gait Details: assist for walker management, cues for step length, posture, proximity to walker      PT Goals (current goals can now be found in the care plan section)    Visit Information  Last PT Received On: 06/05/13 Assistance Needed: +1 History of Present Illness: RIVKA BAUNE is a 63 y.o. female who presented to ER with AMS for 2-3 days.  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       Subjective Data      Cognition  Cognition Arousal/Alertness: Awake/alert Behavior During Therapy: WFL for tasks assessed/performed Following Commands: Follows one step commands with increased time Safety/Judgement: Decreased awareness of safety Problem Solving: Slow processing    Balance  Balance Sitting balance-Leahy Scale: Poor Sitting balance - Comments: leaning forward, close supervision and cues for posture Standing balance-Leahy Scale: Poor Standing balance comment: stood at sink to wash hands, unable to stand without at least one UE support  End of Session PT - End of Session Equipment Utilized During Treatment: Gait belt Activity Tolerance: Patient limited by fatigue Patient left: in chair;with call bell/phone within reach;with chair alarm set   GP     [facility] 06/05/2013, 1:29 PM [place], [place] 06/05/2013

## 2013-06-05 NOTE — Progress Notes (Signed)
Subjective:  Awoken from sleep , no cos, alert oriented to mch, wed, hd is tts. Tolerated hd yesterday Objective Vital signs in last 24 hours: Filed Vitals:   06/04/13 2000 06/04/13 2339 06/05/13 0332 06/05/13 1000  BP:  160/60 138/100 132/53  Pulse: 123 122 122 106  Temp: 99.1 F (37.3 C) 98.8 F (37.1 C) 99 F (37.2 C) 98 F (36.7 C)  TempSrc: Oral Oral Oral Oral  Resp: 21 20 20 22   Height:  5\' 3"  (1.6 m)    Weight:      SpO2: 97% 94% 96% 98%  Exam:   NAD OX2 and fully awake and alert  And seems appropriate  Chest clear bilat  RRR no MRG  Abd soft, scaphoid, NT, ND  No LE or UE edema  L A AVGG Pos. Bruit  Dialysis: TTS [place]  3h 73min 58 kg 2K/2.0 [place] Heparin 6000 L arm AVG  Hectorol 1 Epo 1200 Venofer none   Assessment:  1 AMS- alert today, probably underlying mild dementia (multi-infarct)  With  element bipolar dz/ [facility] did not miss [facility], "has had some op episodes  [facility]" 2 Foot pain, bilat- ?ischemic  3 ESRD on hemodialysis TTS [facility] 4 HTN norvasc 5, BP's stable after hd and  at dry wt  5 Anemia Hb 12>11.9  hold epo  6 2HPT cont phoslo ac, vit D  8 Hx bipolar disorder- no meds listed ./ ?haldol in past 9 DM on insulin     Plan = HD in am on schedule Labs: Basic Metabolic Panel:  Recent Labs Lab 06/03/13 1245 06/04/13 1130  NA  --  140  K  --  4.4  CL  --  95*  CO2  --  25  GLUCOSE  --  235*  BUN  --  52*  CREATININE 7.30* 8.84*  CALCIUM  --  10.0  PHOS  --  4.9*   Liver Function Tests:  Recent Labs Lab 06/04/13 1130  AST 16  ALT 25  ALKPHOS 121*  BILITOT <0.2*  PROT 7.5  ALBUMIN 3.0*  3.0*   CBC:  Recent Labs Lab 06/03/13 1245 06/05/13 0718  WBC 14.1* 10.0  HGB 12.5 11.9*  HCT 35.7* 34.4*  MCV 90.2 89.6  PLT 640* 619*  CBG:  Recent Labs Lab 06/04/13 0813 06/04/13 1706 06/04/13 2134 06/04/13 2334 06/05/13 0758  GLUCAP 84 128* 339* 243* 143*   Studies/Results: No results  found. Medications:   . amLODipine  5 mg Oral QHS  . aspirin EC  81 mg Oral Daily  . calcium acetate  667 mg Oral TID WC  . doxercalciferol  1 mcg Intravenous Q T,Th,Sa-HD  . feeding supplement (NEPRO CARB STEADY)  237 mL Oral BID BM  . heparin  5,000 Units Subcutaneous 3 times per day  . insulin aspart  0-5 Units Subcutaneous QHS  . insulin aspart  0-9 Units Subcutaneous TID WC  . insulin detemir  14 Units Subcutaneous QHS  . multivitamin  1 tablet Oral QHS  . simvastatin  20 mg Oral q1800    Ernest Haber, PA-C [place] [phone] 06/05/2013,11:28 AM  LOS: 2 days   Pt seen, examined and agree w A/P as above. Fully alert, at baseline . MRI pending per primary. No other suggestions.  Kelly Splinter MD pager [phone]    cell [phone] 06/05/2013, 2:24 PM

## 2013-06-05 NOTE — Progress Notes (Addendum)
[facility] Progress Note [place] TEAM 1 - Stepdown/ICU TEAM   MARVINA DANNER GUY:[id number] DOB: Apr 27, 1950 DOA: 06/03/2013 PCP: Marco Collie, MD  Brief narrative: PHENIX GREIN is a 63 y.o. female presenting on 06/03/2013 with  has a past medical history of Diabetes mellitus; Hypertension; CHF (congestive heart failure); Hyperlipidemia; End-stage renal disease on hemodialysis; Thyroid disease; Depression; Shortness of breath; Arthritis; Dementia; Stroke; Constipation; Vision disturbance; and End stage renal disease (03/30/2011) who presents with altered mental status.   Subjective: She is alert - no complaints.   Assessment/Plan: Principal Problem:   Acute encephalopathy - resolved - originally suspected that patient missed dialysis (she reported this to me yesterday). However, per neprho notes, pt did not miss a dialysis - question if patient is taking some medications at home that can be causing this-for example, Neurontin was supposed to be discontinued per last d/c summary- she could be taking this - have attempted to call the daughter - no answer and unable to leave message - MRi was ordered on admission however, I cancelled this when she told me that she missed dialysis as i suspected AMS was due to uremia. However, as this may not be the case, I will re-order MRI  Active Problems:   Chronic kidney disease, stage IV (severe) - per nephrology    Type II or unspecified type diabetes mellitus without mention of complication, not stated as uncontrolled - sugars rising during the day- will add Novolog with meals and stop sliding scale  Hyperlipidemia - start Zocor  Leukocytosis - no signs of infection - repeat today reveals normalization  Thrombocytosis - no cause found- follow   Code Status: full code Family Communication:  have attempted to call the daughter - no answer and unable to leave message Disposition Plan: home- tx to  med/surg  Consultants: nephrology  Procedures: none  Antibiotics: Antibiotics Given (last 72 hours)   None       DVT prophylaxis: heparin  Objective: Filed Weights   06/03/13 0915 06/04/13 1241 06/04/13 1640  Weight: 58.2 kg (128 lb 4.9 oz) 58.4 kg (128 lb 12 oz) 56.9 kg (125 lb 7.1 oz)   Blood pressure 132/53, pulse 106, temperature 98 F (36.7 C), temperature source Oral, resp. rate 22, height 5\' 3"  (1.6 m), weight 56.9 kg (125 lb 7.1 oz), SpO2 98.00%.  Intake/Output Summary (Last 24 hours) at 06/05/13 1401 Last data filed at 06/05/13 1100  Gross per 24 hour  Intake    460 ml  Output   1500 ml  Net  -1040 ml     Exam: General: No acute respiratory distress Lungs: Clear to auscultation bilaterally without wheezes or crackles Cardiovascular: Regular rate and rhythm without murmur gallop or rub normal S1 and S2 Abdomen: Nontender, nondistended, soft, bowel sounds positive, no rebound, no ascites, no appreciable mass Extremities: No significant cyanosis, clubbing, or edema bilateral lower extremities  Data Reviewed: Basic Metabolic Panel:  Recent Labs Lab 06/03/13 1245 06/04/13 1130  NA  --  140  K  --  4.4  CL  --  95*  CO2  --  25  GLUCOSE  --  235*  BUN  --  52*  CREATININE 7.30* 8.84*  CALCIUM  --  10.0  PHOS  --  4.9*   Liver Function Tests:  Recent Labs Lab 06/04/13 1130  AST 16  ALT 25  ALKPHOS 121*  BILITOT <0.2*  PROT 7.5  ALBUMIN 3.0*  3.0*   No results found for this basename:  LIPASE, AMYLASE,  in the last 168 hours No results found for this basename: AMMONIA,  in the last 168 hours CBC:  Recent Labs Lab 06/03/13 1245 06/05/13 0718  WBC 14.1* 10.0  HGB 12.5 11.9*  HCT 35.7* 34.4*  MCV 90.2 89.6  PLT 640* 619*   Cardiac Enzymes: No results found for this basename: CKTOTAL, CKMB, CKMBINDEX, TROPONINI,  in the last 168 hours BNP (last 3 results) No results found for this basename: PROBNP,  in the last 8760  hours CBG:  Recent Labs Lab 06/04/13 1706 06/04/13 2134 06/04/13 2334 06/05/13 0758 06/05/13 1243  GLUCAP 128* 339* 243* 143* 251*    No results found for this or any previous visit (from the past 240 hour(s)).   Studies:  Recent x-ray studies have been reviewed in detail by the Attending Physician  Scheduled Meds:  Scheduled Meds: . amLODipine  5 mg Oral QHS  . aspirin EC  81 mg Oral Daily  . calcium acetate  667 mg Oral TID WC  . doxercalciferol  1 mcg Intravenous Q T,Th,Sa-HD  . feeding supplement (NEPRO CARB STEADY)  237 mL Oral BID BM  . heparin  5,000 Units Subcutaneous 3 times per day  . insulin aspart  0-5 Units Subcutaneous QHS  . insulin aspart  0-9 Units Subcutaneous TID WC  . insulin detemir  14 Units Subcutaneous QHS  . multivitamin  1 tablet Oral QHS  . simvastatin  20 mg Oral q1800   Continuous Infusions:   Time spent on care of this patient: >35 min   Debbe Odea, MD  [facility] Office  [phone] Pager - Text Page per Shea Evans as per below:  On-Call/Text Page:      [email or web address]  If 7PM-7AM, please contact night-coverage [email or web address] 06/05/2013, 2:01 PM   LOS: 2 days

## 2013-06-05 NOTE — Progress Notes (Signed)
INITIAL NUTRITION ASSESSMENT  DOCUMENTATION CODES Per approved criteria  -Severe malnutrition in the context of chronic illness   INTERVENTION: RD to clarify diet order to Renal with Carbohydrate Modified. RD provided diet education this visit; only limited education provided given AMS and mild dementia. Add Nepro Shake po BID, each supplement provides 425 kcal and 19 grams protein. Patient enjoys berry flavor. RD to continue to follow nutrition care plan.  NUTRITION DIAGNOSIS: Inadequate oral intake related to variable appetite AEB variable intake and weight loss.  Goal: Intake to meet >90% of estimated nutrition needs.  Monitor:  weight trends, lab trends, I/O's, PO intake, supplement tolerance  Reason for Assessment: MD Consult for Diet Education  63 y.o. female  Admitting Dx: Acute encephalopathy  ASSESSMENT: PMHx significant for ESRD (TTS), DM, HTN, CAD. Admitted AMS. 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 Pt was recently admitted for the same thing. This RD is familiar with this patient from that admission.  RD consulted for education. Noted pt with AMS and also has baseline dementia. RD provided meal planning ideas for DM + HD, however did not complete extensive verbal education, as pt is not appropriate.  Currently ordered for Renal Diet with "Carbohydrate Modified and Lowfat" restrictions. RD to clarify diet order to Renal/CHO Mod diet.  Patient reports that she was eating well PTA. No family at bedside available to confirm this. Pt has eaten a muffin and grits this morning.  Pt with 12% wt loss x 6 months.   Nutrition Focused Physical Exam:  Subcutaneous Fat:  Orbital Region: WNL Upper Arm Region: WNL Thoracic and Lumbar Region: n/a  Muscle:  Temple Region: moderate depletion Clavicle Bone Region: WNL Clavicle and Acromion Bone Region: WNL Scapular Bone Region: n/a Dorsal Hand: severe depletion Patellar Region: severe depletion Anterior Thigh Region:  severe depletion Posterior Calf Region: severe depletion  Edema: n/a  Pt meets criteria for severe MALNUTRITION in the context of chronic illness as evidenced by 17% wt loss x 6 months and severe muscle depletion.  Elevated phosphorus of 4.9. Potassium WNL CBG's: 339, 243, 143 Current HgbA1c is 8.0 Medications include: phoslo, novolog (sensitive scale) and levemir, rena-vit  Height: Ht Readings from Last 1 Encounters:  06/04/13 5\' 3"  (1.6 m)    Weight: Wt Readings from Last 1 Encounters:  06/04/13 125 lb 7.1 oz (56.9 kg)    Ideal Body Weight: 115 lb  % Ideal Body Weight: 108%  Wt Readings from Last 10 Encounters:  06/04/13 125 lb 7.1 oz (56.9 kg)  05/26/13 127 lb 3.3 oz (57.7 kg)  11/02/12 150 lb 8 oz (68.266 kg)  09/14/12 145 lb (65.772 kg)  07/02/12 146 lb 9.7 oz (66.5 kg)  07/02/12 146 lb 9.7 oz (66.5 kg)  06/26/12 150 lb (68.04 kg)  03/30/11 146 lb (66.225 kg)  01/18/11 146 lb 4.8 oz (66.361 kg)    Usual Body Weight: 145 - 150 lb  % Usual Body Weight: 83%  BMI:  Body mass index is 22.23 kg/(m^2). Normal weight  Estimated Nutritional Needs: Kcal: 1500 - 1700 Protein: 70 - 80 g Fluid: 1.2 liters  Skin: intact  Diet Order: Renal  EDUCATION NEEDS: -No education needs identified at this time   Intake/Output Summary (Last 24 hours) at 06/05/13 0936 Last data filed at 06/04/13 1818  Gross per 24 hour  Intake    460 ml  Output   1500 ml  Net  -1040 ml    Last BM: PTA  Labs:   Recent Labs Lab 06/03/13  1245 06/04/13 1130  NA  --  140  K  --  4.4  CL  --  95*  CO2  --  25  BUN  --  52*  CREATININE 7.30* 8.84*  CALCIUM  --  10.0  PHOS  --  4.9*  GLUCOSE  --  235*    CBG (last 3)   Recent Labs  06/04/13 2134 06/04/13 2334 06/05/13 0758  GLUCAP 339* 243* 143*   Lab Results  Component Value Date   HGBA1C 8.0* 06/04/2013    Scheduled Meds: . amLODipine  5 mg Oral QHS  . aspirin EC  81 mg Oral Daily  . calcium acetate  667 mg  Oral TID WC  . doxercalciferol  1 mcg Intravenous Q T,Th,Sa-HD  . heparin  5,000 Units Subcutaneous 3 times per day  . insulin aspart  0-5 Units Subcutaneous QHS  . insulin aspart  0-9 Units Subcutaneous TID WC  . insulin detemir  14 Units Subcutaneous QHS  . multivitamin  1 tablet Oral QHS  . simvastatin  20 mg Oral q1800    Continuous Infusions:   Past Medical History  Diagnosis Date  . Diabetes mellitus   . Hypertension   . CHF (congestive heart failure)   . Hyperlipidemia   . End-stage renal disease on hemodialysis     Started dialysis in late 2014. Gets HD in [place] on TTS schedule.     . Thyroid disease   . Depression   . Shortness of breath     at night  . Arthritis   . Dementia   . Stroke     2013, weakness- all over  . Constipation   . Vision disturbance   . End stage renal disease 03/30/2011    Past Surgical History  Procedure Laterality Date  . Arteriovenous graft placement      left upper extremity  . Tubal ligation      x2  . Av fistula placement Left 07/02/2012    Procedure: ARTERIOVENOUS (AV) FISTULA CREATION;  Surgeon: Conrad [place], MD;  Location: [place];  Service: Vascular;  Laterality: Left;  . Av fistula placement Left 10/01/2012    Procedure: INSERTION OF ARTERIOVENOUS (AV) GORE-TEX GRAFT LEFT UPPER ARM;  Surgeon: Conrad Rewey, MD;  Location: [place];  Service: Vascular;  Laterality: Left;  Ultrasound guided    Inda Coke MS, RD, LDN Inpatient Registered Dietitian Pager: [phone] After-hours pager: [phone]

## 2013-06-05 NOTE — Progress Notes (Signed)
   CARE MANAGEMENT NOTE 06/05/2013  Patient:  [name], [name]   Account Number:  [id number]  Date Initiated:  06/03/2013  Documentation initiated by:  [facility]  Subjective/Objective Assessment:   63 y.o. year-old with DM, HTN, ESRD sent to ED with AMS for 2 days.//From SNF     Action/Plan:   tele  -FLP, HgbA1C  -neuro checks  -stroke swallow screen//Return to SNF   Anticipated DC Date:  06/07/2013   Anticipated DC Plan:  SKILLED NURSING FACILITY  In-house referral  Clinical Social Worker      DC Planning Services  CM consult      Choice offered to / List presented to:             Status of service:  In process, will continue to follow Medicare Important Message given?   (If response is "NO", the following Medicare IM given date fields will be blank) Date Medicare IM given:   Date Additional Medicare IM given:    Discharge Disposition:    Per UR Regulation:    If discussed at Long Length of Stay Meetings, dates discussed:    Comments:  contactMendel Corning  granddaughter  [phone]  06/05/2013  [place], [place]  [phone] spoek with Mendel Corning regarding discharge plans. They have 24/7 care at home, aide from [facility] 2.5 hours everyday will need 3:1, wheelchair If order for [facility] would chose [facility].  MD updated via text.

## 2013-06-05 NOTE — Progress Notes (Signed)
Patients heart rate 150 nonsustained while ambulating with PT. Currently 130. MD notified.

## 2013-06-06 DIAGNOSIS — N058 Unspecified nephritic syndrome with other morphologic changes: Secondary | ICD-10-CM

## 2013-06-06 DIAGNOSIS — G9340 Encephalopathy, unspecified: Secondary | ICD-10-CM

## 2013-06-06 DIAGNOSIS — I6350 Cerebral infarction due to unspecified occlusion or stenosis of unspecified cerebral artery: Principal | ICD-10-CM

## 2013-06-06 DIAGNOSIS — E1129 Type 2 diabetes mellitus with other diabetic kidney complication: Secondary | ICD-10-CM

## 2013-06-06 DIAGNOSIS — N186 End stage renal disease: Secondary | ICD-10-CM

## 2013-06-06 LAB — CBC
HEMATOCRIT: 32.5 % — AB (ref 36.0–46.0)
Hemoglobin: 11.1 g/dL — ABNORMAL LOW (ref 12.0–15.0)
MCH: 30.6 pg (ref 26.0–34.0)
MCHC: 34.2 g/dL (ref 30.0–36.0)
MCV: 89.5 fL (ref 78.0–100.0)
Platelets: 533 10*3/uL — ABNORMAL HIGH (ref 150–400)
RBC: 3.63 MIL/uL — ABNORMAL LOW (ref 3.87–5.11)
RDW: 14.1 % (ref 11.5–15.5)
WBC: 8.3 10*3/uL (ref 4.0–10.5)

## 2013-06-06 LAB — BASIC METABOLIC PANEL
BUN: 44 mg/dL — AB (ref 6–23)
CO2: 25 mEq/L (ref 19–32)
CREATININE: 7.69 mg/dL — AB (ref 0.50–1.10)
Calcium: 10.1 mg/dL (ref 8.4–10.5)
Chloride: 92 mEq/L — ABNORMAL LOW (ref 96–112)
GFR, EST AFRICAN AMERICAN: 6 mL/min — AB (ref >90)
GFR, EST NON AFRICAN AMERICAN: 5 mL/min — AB (ref >90)
GLUCOSE: 153 mg/dL — AB (ref 70–99)
Potassium: 4.2 mEq/L (ref 3.7–5.3)
Sodium: 137 mEq/L (ref 137–147)

## 2013-06-06 LAB — GLUCOSE, CAPILLARY
GLUCOSE-CAPILLARY: 151 mg/dL — AB (ref 70–99)
Glucose-Capillary: 129 mg/dL — ABNORMAL HIGH (ref 70–99)
Glucose-Capillary: 129 mg/dL — ABNORMAL HIGH (ref 70–99)

## 2013-06-06 MED ORDER — CLOPIDOGREL BISULFATE 75 MG PO TABS
75.0000 mg | ORAL_TABLET | Freq: Every day | ORAL | Status: DC
  Administered 2013-06-07: 75 mg via ORAL
  Filled 2013-06-06 (×2): qty 1

## 2013-06-06 MED ORDER — DOXERCALCIFEROL 4 MCG/2ML IV SOLN
INTRAVENOUS | Status: AC
  Administered 2013-06-06: 1 ug via INTRAVENOUS
  Filled 2013-06-06: qty 2

## 2013-06-06 MED ORDER — ASPIRIN 325 MG PO TABS
325.0000 mg | ORAL_TABLET | Freq: Every day | ORAL | Status: DC
  Filled 2013-06-06: qty 1

## 2013-06-06 NOTE — Progress Notes (Signed)
[facility] PROGRESS NOTE  [name] NWG:[id number] DOB: [birth year] DOA: 06/03/2013 PCP: Marco Collie, MD  Assessment/Plan: 1. Acute CVA. An MRI of brain performed on 06/05/2013 show an acute nonhemorrhagic infarct involving the right lateral thalamus. It appears that she had been on 81 allograft of aspirin daily prior to this admission. I discussed case with neurology, recommending the discontinuation of aspirin and starting Plavix any 5 mg by mouth daily. She had a transthoracic echocardiogram as well as carotid Dopplers done on this admission. Will obtain a fasting lipid panel and hemoglobin A1c.  2. Acute encephalopathy. I suspect vascular dementia and acute CVA contributing to mental status changes. Will start Plavix therapy today. Continue statin therapy, risk factor modification. 3. End-stage renal disease. Patient undergoing hemodialysis during this hospitalization. 4. Type 2 diabetes mellitus. Will obtain a hemoglobin A1c, continue insulin Detemir 14 units subcutaneous each bedtime  and meal time coverage. 5. Dyslipidemia. Continue simvastatin 20 mg by mouth daily 6. Hypertension. Continue amlodipine 5 mg by mouth daily.  Code Status: Full Code Family Communication: Family members not present Disposition Plan: Neurology consultation placed   Consultants:  Nephrology  Neurology  Procedures:  Transthoracic echocardiogram performed on 06/03/2013 showed an ejection fraction 55-60%  Carotid Dopplers performed on 06/04/2013 showing 1-39% stenosis involving right internal carotid artery and left internal carotid artery  Antibiotics:  None  HPI/Subjective: Patient is a pleasant 63 year old female with a past medical history of end-stage renal disease, hypertension, diabetes mellitus, history of CVA, who was admitted to the medicine service on 06/03/2013 presenting with mental status changes. Family had reported that mental status changes started 2-3 days prior to  admission. Patient's recently discharged on 05/27/2013 at which time she presented with mental status changes felt to be secondary to uremic encephalopathy. During this hospitalization she was seen by nephrology as she underwent hemodialysis. Mental status changes improving. On 06/05/2013 she had an MRI of brain which showed an acute nonhemorrhagic infarct involving the right lateral thalamus measuring 6 mm. Also noted were multiple remote lacunar infarcts present in the basal ganglia and thalami bilaterally. Carotid Doppler showed 1-39% stenosis involving the right internal carotid artery and the left internal carotid artery. She had a transthoracic echocardiogram that was done on 06/03/2013 that showed an ejection fraction 55-60%. I discussed case with neurology on 06/06/2013 who recommended the discontinuation of aspirin and starting Plavix 75 mg by mouth daily.  Objective: Filed Vitals:   06/06/13 1234  BP: 150/54  Pulse: 75  Temp: 98.8 F (37.1 C)  Resp: 18    Intake/Output Summary (Last 24 hours) at 06/06/13 1640 Last data filed at 06/06/13 1523  Gross per 24 hour  Intake    240 ml  Output    -91 ml  Net    331 ml   Filed Weights   06/04/13 1640 06/06/13 0649 06/06/13 1044  Weight: 56.9 kg (125 lb 7.1 oz) 57.5 kg (126 lb 12.2 oz) 56.6 kg (124 lb 12.5 oz)    Exam:   General:  Patient is in no acute distress, remains confused and disoriented  Cardiovascular: Regular rate rhythm normal S1 is  Respiratory: Clear to auscultation bilaterally  Abdomen: Soft nontender nondistended  Musculoskeletal: No edema  Data Reviewed: Basic Metabolic Panel:  Recent Labs Lab 06/03/13 1245 06/04/13 1130 06/06/13 0703  NA  --  140 137  K  --  4.4 4.2  CL  --  95* 92*  CO2  --  25 25  GLUCOSE  --  235* 153*  BUN  --  52* 44*  CREATININE 7.30* 8.84* 7.69*  CALCIUM  --  10.0 10.1  PHOS  --  4.9*  --    Liver Function Tests:  Recent Labs Lab 06/04/13 1130  AST 16  ALT 25   ALKPHOS 121*  BILITOT <0.2*  PROT 7.5  ALBUMIN 3.0*  3.0*   No results found for this basename: LIPASE, AMYLASE,  in the last 168 hours No results found for this basename: AMMONIA,  in the last 168 hours CBC:  Recent Labs Lab 06/03/13 1245 06/05/13 0718 06/06/13 0651  WBC 14.1* 10.0 8.3  HGB 12.5 11.9* 11.1*  HCT 35.7* 34.4* 32.5*  MCV 90.2 89.6 89.5  PLT 640* 619* 533*   Cardiac Enzymes: No results found for this basename: CKTOTAL, CKMB, CKMBINDEX, TROPONINI,  in the last 168 hours BNP (last 3 results) No results found for this basename: PROBNP,  in the last 8760 hours CBG:  Recent Labs Lab 06/05/13 0758 06/05/13 1243 06/05/13 1625 06/05/13 2115 06/06/13 1212  GLUCAP 143* 251* 236* 242* 129*    No results found for this or any previous visit (from the past 240 hour(s)).   Studies: Mr Virgel Paling Wo Contrast  06/05/2013   CLINICAL DATA Altered mental status. Acute encephalopathy following dialysis. Confusion.  EXAM MRI HEAD WITHOUT CONTRAST  MRA HEAD WITHOUT CONTRAST  TECHNIQUE Multiplanar, multiecho pulse sequences of the brain and surrounding structures were obtained without intravenous contrast. Angiographic images of the head were obtained using MRA technique without contrast.  COMPARISON CT HEAD W/O CM dated 06/03/2013; MR HEAD W/O CM dated 09/07/2009  FINDINGS MRI HEAD FINDINGS  An acute nonhemorrhagic infarct involving the right lateral thalamus measures 6 mm. No other acute infarcts are present. Multiple remote lacunar infarcts are present in the basal ganglia and thalami bilaterally. Remote lacunar infarcts are evident within the coronal radiata as well. Remote lacunar infarcts of the brainstem are again noted. Atrophy and extensive bilateral periventricular and subcortical white matter disease is again noted. Infarcts involving the posterior right lentiform nucleus are not acute but are new since the prior MRI of 2011.  Flow is present in the major intracranial  arteries. The patient is status post bilateral lens replacements. The globes and orbits are otherwise intact. Chronic left sphenoid sinus disease is again noted. The paranasal sinuses are otherwise clear. There is some fluid in the left mastoid air cells. No obstructing nasopharyngeal lesion is evident.  MRA HEAD FINDINGS  A moderate stenosis is present in the precavernous left internal carotid artery. Additional atherosclerotic irregularity is present through the cavernous segments bilaterally without other significant stenoses greater than 50%. The A1 and M1 segments are somewhat irregular but without focal stenosis. The anterior communicating artery is patent. The MCA trifurcations are intact. There is significant attenuation of the inferior right M2 branch. Mild to moderate stenosis is present in the lateral left M2 branch proximally. Moderate small vessel changes are present bilaterally.  The left vertebral artery is dominant to the right. The PICA origins are visualized and normal. The basilar artery is within normal limits. The posterior cerebral arteries originate from the basilar tip. There is a moderate stenosis in the right P1/ P2 segment. Moderate distal small vessel disease is evident bilaterally.  IMPRESSION 1. Acute nonhemorrhagic infarct involving the right lateral thalamus. 2. Multiple remote lacunar infarcts are present in the basal ganglia bilaterally with some progression since 2011. 3. Atrophy and extensive white matter disease has progressed as  well. 4. Moderate stenosis of the precavernous left internal carotid artery. 5. Moderate small vessel disease bilaterally, more severe anteriorly than posteriorly.  SIGNATURE  Electronically Signed   By: Lawrence Santiago M.D.   On: 06/05/2013 18:21   Mr Brain Wo Contrast  06/05/2013   CLINICAL DATA Altered mental status. Acute encephalopathy following dialysis. Confusion.  EXAM MRI HEAD WITHOUT CONTRAST  MRA HEAD WITHOUT CONTRAST  TECHNIQUE Multiplanar,  multiecho pulse sequences of the brain and surrounding structures were obtained without intravenous contrast. Angiographic images of the head were obtained using MRA technique without contrast.  COMPARISON CT HEAD W/O CM dated 06/03/2013; MR HEAD W/O CM dated 09/07/2009  FINDINGS MRI HEAD FINDINGS  An acute nonhemorrhagic infarct involving the right lateral thalamus measures 6 mm. No other acute infarcts are present. Multiple remote lacunar infarcts are present in the basal ganglia and thalami bilaterally. Remote lacunar infarcts are evident within the coronal radiata as well. Remote lacunar infarcts of the brainstem are again noted. Atrophy and extensive bilateral periventricular and subcortical white matter disease is again noted. Infarcts involving the posterior right lentiform nucleus are not acute but are new since the prior MRI of 2011.  Flow is present in the major intracranial arteries. The patient is status post bilateral lens replacements. The globes and orbits are otherwise intact. Chronic left sphenoid sinus disease is again noted. The paranasal sinuses are otherwise clear. There is some fluid in the left mastoid air cells. No obstructing nasopharyngeal lesion is evident.  MRA HEAD FINDINGS  A moderate stenosis is present in the precavernous left internal carotid artery. Additional atherosclerotic irregularity is present through the cavernous segments bilaterally without other significant stenoses greater than 50%. The A1 and M1 segments are somewhat irregular but without focal stenosis. The anterior communicating artery is patent. The MCA trifurcations are intact. There is significant attenuation of the inferior right M2 branch. Mild to moderate stenosis is present in the lateral left M2 branch proximally. Moderate small vessel changes are present bilaterally.  The left vertebral artery is dominant to the right. The PICA origins are visualized and normal. The basilar artery is within normal limits. The  posterior cerebral arteries originate from the basilar tip. There is a moderate stenosis in the right P1/ P2 segment. Moderate distal small vessel disease is evident bilaterally.  IMPRESSION 1. Acute nonhemorrhagic infarct involving the right lateral thalamus. 2. Multiple remote lacunar infarcts are present in the basal ganglia bilaterally with some progression since 2011. 3. Atrophy and extensive white matter disease has progressed as well. 4. Moderate stenosis of the precavernous left internal carotid artery. 5. Moderate small vessel disease bilaterally, more severe anteriorly than posteriorly.  SIGNATURE  Electronically Signed   By: Lawrence Santiago M.D.   On: 06/05/2013 18:21    Scheduled Meds: . amLODipine  5 mg Oral QHS  . aspirin  325 mg Oral Daily  . calcium acetate  667 mg Oral TID WC  . doxercalciferol  1 mcg Intravenous Q T,Th,Sa-HD  . feeding supplement (NEPRO CARB STEADY)  237 mL Oral BID BM  . heparin  5,000 Units Subcutaneous 3 times per day  . insulin aspart  0-5 Units Subcutaneous QHS  . insulin aspart  0-9 Units Subcutaneous TID WC  . insulin aspart  5 Units Subcutaneous TID WC  . insulin detemir  14 Units Subcutaneous QHS  . multivitamin  1 tablet Oral QHS  . simvastatin  20 mg Oral q1800   Continuous Infusions:   Principal Problem:  Acute encephalopathy Active Problems:   Chronic kidney disease, stage IV (severe)   Type II or unspecified type diabetes mellitus without mention of complication, not stated as uncontrolled    Time spent: 20 min    Kelvin Cellar  [facility] Pager [phone]. If 7PM-7AM, please contact night-coverage at [email or web address], password [facility] 06/06/2013, 4:40 PM  LOS: 3 days

## 2013-06-06 NOTE — Progress Notes (Signed)
[facility] Progress Note  Subjective:   Hasn't eaten today. No c/o  Objective Filed Vitals:   06/06/13 0500 06/06/13 0649 06/06/13 0656 06/06/13 0700  BP: 141/61 134/70 121/66 132/70  Pulse: 102 105 95 106  Temp: 98.2 F (36.8 C) 97.9 F (36.6 C)    TempSrc: Oral Oral    Resp: 18 19    Height:      Weight:  57.5 kg (126 lb 12.2 oz)    SpO2: 99% 99%     Physical Exam General: NAD supine on HD Heart: tachy regular Lungs: no wheezes or rales Abdomen:soft NT Extremities:  No LE edema; feet warm; two small dark areas around right great toe nail beds Dialysis Access:  Left AVGG Qb 400 Neuro: baseline  Dialysis: TTS [place]  3h 28min 58 kg 2K/2.0 [place] Heparin 6000 L arm AVG  Hectorol 1 Epo 1200 Venofer none   Assessment/Plan: 1.AMS- New acute nonhemorrhagic infarct involving the right lateral thalamus plus prior intfrcts with underlying mild dementia (multi-infarct) With element bipolar dz/ vrified with op [facility] did not miss hd, "has had some op episodes. MS seems baseline to me.  2 Foot pain, bilat- (she says mostly big toes) ?ischemic no erythema;  3 ESRD on hemodialysis TTS ASH labs pending 4 HTN norvasc 5, BP's stable after hd Tuesday; today below EDW but not standing weights - goal initially set for 1.5 = UF volume on Tuesday but BP dropped to low 100s; keeping even the rest of the treatment; tachy but tachy before BP drop; crit line not working 5 Anemia Hb 12>11.9> 11.1 - drifting down - resume if drops <10.5 hold epo  6 2HPT cont phoslo ac, vit D; mild hypercalcemia -labs pending 8 Hx bipolar disorder- no meds listed ./ ?haldol in past  9 DM on insulin -BS 200s on [place], PA-C [place] [phone] 06/06/2013,8:12 AM  LOS: 3 days   Pt seen, examined and agree w A/P as above. Acute CVA on MRI, prior bilat thalamic and basal ganglia infarcts, worsening atrophy and WM disease.  Findings are c/w with clinical  picture of recurrent acute on chronic cognitive decline due to multi-infarct disease. Stable from renal standpoint.  HD today.  Kelly Splinter MD pager [phone]    cell [phone] 06/06/2013, 11:23 AM     Additional Objective Labs: Basic Metabolic Panel:  Recent Labs Lab 06/03/13 1245 06/04/13 1130  NA  --  140  K  --  4.4  CL  --  95*  CO2  --  25  GLUCOSE  --  235*  BUN  --  52*  CREATININE 7.30* 8.84*  CALCIUM  --  10.0  PHOS  --  4.9*   Liver Function Tests:  Recent Labs Lab 06/04/13 1130  AST 16  ALT 25  ALKPHOS 121*  BILITOT <0.2*  PROT 7.5  ALBUMIN 3.0*  3.0*   CBC:  Recent Labs Lab 06/03/13 1245 06/05/13 0718 06/06/13 0651  WBC 14.1* 10.0 8.3  HGB 12.5 11.9* 11.1*  HCT 35.7* 34.4* 32.5*  MCV 90.2 89.6 89.5  PLT 640* 619* 533*  CBG:  Recent Labs Lab 06/04/13 2334 06/05/13 0758 06/05/13 1243 06/05/13 1625 06/05/13 2115  GLUCAP 243* 143* 251* 236* 242*  Studies/Results: Mr Jodene Nam Head Wo Contrast  06/05/2013   CLINICAL DATA Altered mental status. Acute encephalopathy following dialysis. Confusion.  EXAM MRI HEAD WITHOUT CONTRAST  MRA HEAD WITHOUT CONTRAST  TECHNIQUE Multiplanar, multiecho pulse  sequences of the brain and surrounding structures were obtained without intravenous contrast. Angiographic images of the head were obtained using MRA technique without contrast.  COMPARISON CT HEAD W/O CM dated 06/03/2013; MR HEAD W/O CM dated 09/07/2009  FINDINGS MRI HEAD FINDINGS  An acute nonhemorrhagic infarct involving the right lateral thalamus measures 6 mm. No other acute infarcts are present. Multiple remote lacunar infarcts are present in the basal ganglia and thalami bilaterally. Remote lacunar infarcts are evident within the coronal radiata as well. Remote lacunar infarcts of the brainstem are again noted. Atrophy and extensive bilateral periventricular and subcortical white matter disease is again noted. Infarcts involving the posterior right lentiform  nucleus are not acute but are new since the prior MRI of 2011.  Flow is present in the major intracranial arteries. The patient is status post bilateral lens replacements. The globes and orbits are otherwise intact. Chronic left sphenoid sinus disease is again noted. The paranasal sinuses are otherwise clear. There is some fluid in the left mastoid air cells. No obstructing nasopharyngeal lesion is evident.  MRA HEAD FINDINGS  A moderate stenosis is present in the precavernous left internal carotid artery. Additional atherosclerotic irregularity is present through the cavernous segments bilaterally without other significant stenoses greater than 50%. The A1 and M1 segments are somewhat irregular but without focal stenosis. The anterior communicating artery is patent. The MCA trifurcations are intact. There is significant attenuation of the inferior right M2 branch. Mild to moderate stenosis is present in the lateral left M2 branch proximally. Moderate small vessel changes are present bilaterally.  The left vertebral artery is dominant to the right. The PICA origins are visualized and normal. The basilar artery is within normal limits. The posterior cerebral arteries originate from the basilar tip. There is a moderate stenosis in the right P1/ P2 segment. Moderate distal small vessel disease is evident bilaterally.  IMPRESSION 1. Acute nonhemorrhagic infarct involving the right lateral thalamus. 2. Multiple remote lacunar infarcts are present in the basal ganglia bilaterally with some progression since 2011. 3. Atrophy and extensive white matter disease has progressed as well. 4. Moderate stenosis of the precavernous left internal carotid artery. 5. Moderate small vessel disease bilaterally, more severe anteriorly than posteriorly.  SIGNATURE  Electronically Signed   By: Lawrence Santiago M.D.   On: 06/05/2013 18:21   Medications:   . amLODipine  5 mg Oral QHS  . aspirin EC  81 mg Oral Daily  . calcium acetate   667 mg Oral TID WC  . doxercalciferol  1 mcg Intravenous Q T,Th,Sa-HD  . feeding supplement (NEPRO CARB STEADY)  237 mL Oral BID BM  . heparin  5,000 Units Subcutaneous 3 times per day  . insulin aspart  0-5 Units Subcutaneous QHS  . insulin aspart  0-9 Units Subcutaneous TID WC  . insulin aspart  5 Units Subcutaneous TID WC  . insulin detemir  14 Units Subcutaneous QHS  . multivitamin  1 tablet Oral QHS  . simvastatin  20 mg Oral q1800

## 2013-06-06 NOTE — Consult Note (Addendum)
Referring Physician: Coralyn Pear    Chief Complaint: Confusion  HPI: [name] is an 63 y.o. female with ESRD who presented on 3/9 with altered mental status.  It seems that there had been some recent medication changes and there was some question as to whether she had been receiving her dialysis.  Although her mental status cleared it seemed that she was going to dialysis and the question remained as to what may have caused her mental status changes other than medications.  Further work up was pursued.    Date last known well: Unable to determine Time last known well: Unable to determine tPA Given: No: Unable to determine LKW  Past Medical History  Diagnosis Date  . Diabetes mellitus   . Hypertension   . CHF (congestive heart failure)   . Hyperlipidemia   . End-stage renal disease on hemodialysis     Started dialysis in late 2014. Gets HD in [place] on TTS schedule.     . Thyroid disease   . Depression   . Shortness of breath     at night  . Arthritis   . Dementia   . Stroke     2013, weakness- all over  . Constipation   . Vision disturbance   . End stage renal disease 03/30/2011    Past Surgical History  Procedure Laterality Date  . Arteriovenous graft placement      left upper extremity  . Tubal ligation      x2  . Av fistula placement Left 07/02/2012    Procedure: ARTERIOVENOUS (AV) FISTULA CREATION;  Surgeon: Conrad [place], MD;  Location: [facility];  Service: Vascular;  Laterality: Left;  . Av fistula placement Left 10/01/2012    Procedure: INSERTION OF ARTERIOVENOUS (AV) GORE-TEX GRAFT LEFT UPPER ARM;  Surgeon: Conrad [place], MD;  Location: [facility];  Service: Vascular;  Laterality: Left;  Ultrasound guided    Family History  Problem Relation Age of Onset  . Diabetes Father   . Stroke Father   . Heart disease Mother    Social History:  reports that she has never smoked. She has never used smokeless tobacco. She reports that she does not drink alcohol or use illicit  drugs.  Allergies: No Known Allergies  Medications:  I have reviewed the patient's current medications. Prior to Admission:  Prescriptions prior to admission  Medication Sig Dispense Refill  . amLODipine (NORVASC) 5 MG tablet Take 1 tablet (5 mg total) by mouth at bedtime.      Marland Kitchen aspirin EC 81 MG tablet Take 81 mg by mouth daily.      . calcium acetate (PHOSLO) 667 MG capsule Take 1 capsule (667 mg total) by mouth 3 (three) times daily with meals.  90 capsule  0  . Insulin Detemir (LEVEMIR FLEXPEN) 100 UNIT/ML Pen Inject 14 Units into the skin daily at 10 pm.  15 mL  11  . multivitamin (RENA-VIT) TABS tablet Take 1 tablet by mouth at bedtime.  30 tablet  0   Scheduled: . amLODipine  5 mg Oral QHS  . calcium acetate  667 mg Oral TID WC  . [START ON 06/07/2013] clopidogrel  75 mg Oral Q breakfast  . doxercalciferol  1 mcg Intravenous Q T,Th,Sa-HD  . feeding supplement (NEPRO CARB STEADY)  237 mL Oral BID BM  . heparin  5,000 Units Subcutaneous 3 times per day  . insulin aspart  0-5 Units Subcutaneous QHS  . insulin aspart  0-9 Units Subcutaneous  TID WC  . insulin aspart  5 Units Subcutaneous TID WC  . insulin detemir  14 Units Subcutaneous QHS  . multivitamin  1 tablet Oral QHS  . simvastatin  20 mg Oral q1800    ROS: History obtained from the patient (no family in the room)  General ROS: negative for - chills, fatigue, fever, night sweats, weight gain or weight loss Psychological ROS: negative for - behavioral disorder, hallucinations, memory difficulties, mood swings or suicidal ideation Ophthalmic ROS: negative for - blurry vision, double vision, eye pain or loss of vision ENT ROS: negative for - epistaxis, nasal discharge, oral lesions, sore throat, tinnitus or vertigo Allergy and Immunology ROS: negative for - hives or itchy/watery eyes Hematological and Lymphatic ROS: negative for - bleeding problems, bruising or swollen lymph nodes Endocrine ROS: negative for -  galactorrhea, hair pattern changes, polydipsia/polyuria or temperature intolerance Respiratory ROS: negative for - cough, hemoptysis, shortness of breath or wheezing Cardiovascular ROS: negative for - chest pain, dyspnea on exertion, edema or irregular heartbeat Gastrointestinal ROS: negative for - abdominal pain, diarrhea, hematemesis, nausea/vomiting or stool incontinence Genito-Urinary ROS: negative for - dysuria, hematuria, incontinence or urinary frequency/urgency Musculoskeletal ROS: negative for - joint swelling or muscular weakness Neurological ROS: as noted in HPI Dermatological ROS: negative for rash and skin lesion changes  Physical Examination: Blood pressure 150/54, pulse 75, temperature 98.8 F (37.1 C), temperature source Oral, resp. rate 18, height 5\' 3"  (1.6 m), weight 56.6 kg (124 lb 12.5 oz), SpO2 99.00%.  Neurologic Examination: Mental Status: Alert.  Somewhat tangential.  Still feels that she missed a lot of dialysis.  Speech fluent without evidence of aphasia.  Able to follow 3 step commands without difficulty. Cranial Nerves: II: Discs flat bilaterally; Visual fields grossly normal, pupils equal, round, reactive to light and accommodation III,IV, VI: ptosis not present, extra-ocular motions intact bilaterally V,VII: smile symmetric, facial light touch sensation normal bilaterally VIII: hearing normal bilaterally IX,X: gag reflex present XI: bilateral shoulder shrug XII: midline tongue extension Motor: Right : Upper extremity   5/5    Left:     Upper extremity   5/5  Lower extremity   5/5     Lower extremity   5/5 Tone and bulk:normal tone throughout; no atrophy noted Sensory: Pinprick and light touch intact throughout, bilaterally Deep Tendon Reflexes: 3+ and symmetric with absent AJ's bilaterally Plantars: Right: downgoing   Left: upgoing Cerebellar: normal finger-to-nose and normal heel-to-shin test Gait: not tested CV: pulses palpable throughout      Laboratory Studies:  Basic Metabolic Panel:  Recent Labs Lab 06/03/13 1245 06/04/13 1130 06/06/13 0703  NA  --  140 137  K  --  4.4 4.2  CL  --  95* 92*  CO2  --  25 25  GLUCOSE  --  235* 153*  BUN  --  52* 44*  CREATININE 7.30* 8.84* 7.69*  CALCIUM  --  10.0 10.1  PHOS  --  4.9*  --     Liver Function Tests:  Recent Labs Lab 06/04/13 1130  AST 16  ALT 25  ALKPHOS 121*  BILITOT <0.2*  PROT 7.5  ALBUMIN 3.0*  3.0*   No results found for this basename: LIPASE, AMYLASE,  in the last 168 hours No results found for this basename: AMMONIA,  in the last 168 hours  CBC:  Recent Labs Lab 06/03/13 1245 06/05/13 0718 06/06/13 0651  WBC 14.1* 10.0 8.3  HGB 12.5 11.9* 11.1*  HCT 35.7* 34.4*  32.5*  MCV 90.2 89.6 89.5  PLT 640* 619* 533*    Cardiac Enzymes: No results found for this basename: CKTOTAL, CKMB, CKMBINDEX, TROPONINI,  in the last 168 hours  BNP: No components found with this basename: POCBNP,   CBG:  Recent Labs Lab 06/05/13 1243 06/05/13 1625 06/05/13 2115 06/06/13 1212 06/06/13 1704  GLUCAP 251* 236* 242* 129* 151*    Microbiology: Results for orders placed during the hospital encounter of 05/18/13  MRSA PCR SCREENING     Status: None   Collection Time    05/19/13  4:54 AM      Result Value Ref Range Status   MRSA by PCR NEGATIVE  NEGATIVE Final   Comment:            The GeneXpert MRSA Assay (FDA     approved for NASAL specimens     only), is one component of a     comprehensive MRSA colonization     surveillance program. It is not     intended to diagnose MRSA     infection nor to guide or     monitor treatment for     MRSA infections.  CULTURE, BLOOD (ROUTINE X 2)     Status: None   Collection Time    05/19/13  2:05 PM      Result Value Ref Range Status   Specimen Description BLOOD RIGHT ANTECUBITAL   Final   Special Requests BOTTLES DRAWN AEROBIC ONLY 10CC   Final   Culture  Setup Time     Final   Value: 05/19/2013  18:50     Performed at [facility]   Culture     Final   Value: VIRIDANS STREPTOCOCCUS     Note: Gram Stain Report Called to,Read Back By and Verified With: ANDY B @ [phone] 05/20/13 BY KRAWS     Performed at [facility]   Report Status 05/22/2013 FINAL   Final  CULTURE, BLOOD (ROUTINE X 2)     Status: None   Collection Time    05/19/13  2:15 PM      Result Value Ref Range Status   Specimen Description BLOOD RIGHT HAND   Final   Special Requests BOTTLES DRAWN AEROBIC ONLY 10CC   Final   Culture  Setup Time     Final   Value: 05/19/2013 18:50     Performed at [facility]   Culture     Final   Value: NO GROWTH 5 DAYS     Performed at [facility]   Report Status 05/25/2013 FINAL   Final  CULTURE, BLOOD (ROUTINE X 2)     Status: None   Collection Time    05/22/13  7:50 AM      Result Value Ref Range Status   Specimen Description BLOOD RIGHT ANTECUBITAL   Final   Special Requests BOTTLES DRAWN AEROBIC ONLY [facility]   Final   Culture  Setup Time     Final   Value: 05/22/2013 12:40     Performed at [facility]   Culture     Final   Value: NO GROWTH 5 DAYS     Performed at [facility]   Report Status 05/28/2013 FINAL   Final  CULTURE, BLOOD (ROUTINE X 2)     Status: None   Collection Time    05/22/13  7:55 AM      Result Value Ref Range Status   Specimen Description BLOOD  RIGHT HAND   Final   Special Requests BOTTLES DRAWN AEROBIC ONLY 10CC   Final   Culture  Setup Time     Final   Value: 05/22/2013 12:40     Performed at [facility]   Culture     Final   Value: NO GROWTH 5 DAYS     Performed at [facility]   Report Status 05/28/2013 FINAL   Final    Coagulation Studies: No results found for this basename: LABPROT, INR,  in the last 72 hours  Urinalysis: No results found for this basename: COLORURINE, APPERANCEUR, LABSPEC, PHURINE, GLUCOSEU, HGBUR, BILIRUBINUR, KETONESUR, PROTEINUR, UROBILINOGEN, NITRITE,  LEUKOCYTESUR,  in the last 168 hours  Lipid Panel:    Component Value Date/Time   CHOL 216* 06/04/2013 0309   TRIG 267* 06/04/2013 0309   HDL 32* 06/04/2013 0309   CHOLHDL 6.8 06/04/2013 0309   VLDL 53* 06/04/2013 0309   LDLCALC 131* 06/04/2013 0309    HgbA1C:  Lab Results  Component Value Date   HGBA1C 8.0* 06/04/2013    Urine Drug Screen:   No results found for this basename: labopia, cocainscrnur, labbenz, amphetmu, thcu, labbarb    Alcohol Level: No results found for this basename: ETH,  in the last 168 hours   Imaging: Mr Virgel Paling Wo Contrast  06/05/2013   CLINICAL DATA Altered mental status. Acute encephalopathy following dialysis. Confusion.  EXAM MRI HEAD WITHOUT CONTRAST  MRA HEAD WITHOUT CONTRAST  TECHNIQUE Multiplanar, multiecho pulse sequences of the brain and surrounding structures were obtained without intravenous contrast. Angiographic images of the head were obtained using MRA technique without contrast.  COMPARISON CT HEAD W/O CM dated 06/03/2013; MR HEAD W/O CM dated 09/07/2009  FINDINGS MRI HEAD FINDINGS  An acute nonhemorrhagic infarct involving the right lateral thalamus measures 6 mm. No other acute infarcts are present. Multiple remote lacunar infarcts are present in the basal ganglia and thalami bilaterally. Remote lacunar infarcts are evident within the coronal radiata as well. Remote lacunar infarcts of the brainstem are again noted. Atrophy and extensive bilateral periventricular and subcortical white matter disease is again noted. Infarcts involving the posterior right lentiform nucleus are not acute but are new since the prior MRI of 2011.  Flow is present in the major intracranial arteries. The patient is status post bilateral lens replacements. The globes and orbits are otherwise intact. Chronic left sphenoid sinus disease is again noted. The paranasal sinuses are otherwise clear. There is some fluid in the left mastoid air cells. No obstructing nasopharyngeal lesion  is evident.  MRA HEAD FINDINGS  A moderate stenosis is present in the precavernous left internal carotid artery. Additional atherosclerotic irregularity is present through the cavernous segments bilaterally without other significant stenoses greater than 50%. The A1 and M1 segments are somewhat irregular but without focal stenosis. The anterior communicating artery is patent. The MCA trifurcations are intact. There is significant attenuation of the inferior right M2 branch. Mild to moderate stenosis is present in the lateral left M2 branch proximally. Moderate small vessel changes are present bilaterally.  The left vertebral artery is dominant to the right. The PICA origins are visualized and normal. The basilar artery is within normal limits. The posterior cerebral arteries originate from the basilar tip. There is a moderate stenosis in the right P1/ P2 segment. Moderate distal small vessel disease is evident bilaterally.  IMPRESSION 1. Acute nonhemorrhagic infarct involving the right lateral thalamus. 2. Multiple remote lacunar infarcts are present in the basal  ganglia bilaterally with some progression since 2011. 3. Atrophy and extensive white matter disease has progressed as well. 4. Moderate stenosis of the precavernous left internal carotid artery. 5. Moderate small vessel disease bilaterally, more severe anteriorly than posteriorly.  SIGNATURE  Electronically Signed   By: Lawrence Santiago M.D.   On: 06/05/2013 18:21   Mr Brain Wo Contrast  06/05/2013   CLINICAL DATA Altered mental status. Acute encephalopathy following dialysis. Confusion.  EXAM MRI HEAD WITHOUT CONTRAST  MRA HEAD WITHOUT CONTRAST  TECHNIQUE Multiplanar, multiecho pulse sequences of the brain and surrounding structures were obtained without intravenous contrast. Angiographic images of the head were obtained using MRA technique without contrast.  COMPARISON CT HEAD W/O CM dated 06/03/2013; MR HEAD W/O CM dated 09/07/2009  FINDINGS MRI HEAD  FINDINGS  An acute nonhemorrhagic infarct involving the right lateral thalamus measures 6 mm. No other acute infarcts are present. Multiple remote lacunar infarcts are present in the basal ganglia and thalami bilaterally. Remote lacunar infarcts are evident within the coronal radiata as well. Remote lacunar infarcts of the brainstem are again noted. Atrophy and extensive bilateral periventricular and subcortical white matter disease is again noted. Infarcts involving the posterior right lentiform nucleus are not acute but are new since the prior MRI of 2011.  Flow is present in the major intracranial arteries. The patient is status post bilateral lens replacements. The globes and orbits are otherwise intact. Chronic left sphenoid sinus disease is again noted. The paranasal sinuses are otherwise clear. There is some fluid in the left mastoid air cells. No obstructing nasopharyngeal lesion is evident.  MRA HEAD FINDINGS  A moderate stenosis is present in the precavernous left internal carotid artery. Additional atherosclerotic irregularity is present through the cavernous segments bilaterally without other significant stenoses greater than 50%. The A1 and M1 segments are somewhat irregular but without focal stenosis. The anterior communicating artery is patent. The MCA trifurcations are intact. There is significant attenuation of the inferior right M2 branch. Mild to moderate stenosis is present in the lateral left M2 branch proximally. Moderate small vessel changes are present bilaterally.  The left vertebral artery is dominant to the right. The PICA origins are visualized and normal. The basilar artery is within normal limits. The posterior cerebral arteries originate from the basilar tip. There is a moderate stenosis in the right P1/ P2 segment. Moderate distal small vessel disease is evident bilaterally.  IMPRESSION 1. Acute nonhemorrhagic infarct involving the right lateral thalamus. 2. Multiple remote lacunar  infarcts are present in the basal ganglia bilaterally with some progression since 2011. 3. Atrophy and extensive white matter disease has progressed as well. 4. Moderate stenosis of the precavernous left internal carotid artery. 5. Moderate small vessel disease bilaterally, more severe anteriorly than posteriorly.  SIGNATURE  Electronically Signed   By: Lawrence Santiago M.D.   On: 06/05/2013 18:21    Assessment: 63 y.o. female presenting with mental status changes.  Although it is likely that her mental status changes are secondary to her underlying vascular dementia and recent medication changes, her work up has revealed an acute infarct. MRI of the brain has been reviewed and shows a tiny right thalamic infarct.  MRA shows no hemodynamically significant stenosis.  Infarct so tiny likely not the cause of presenting altered mental status.   A1c is elevated at 8.0.  LDL is 131.  Echocardiogram is unremarkable.  Carotid dopplers show no hemodynamically significant stenosis. Infarct   Stroke Risk Factors - diabetes mellitus, hyperlipidemia and  hypertension  Plan: 1. PT consult, OT consult 2. Prophylactic therapy-Antiplatelet med: Plavix - dose 75 mg daily 3. Agree with statin initiation 4. Telemetry monitoring 5. Frequent neuro checks  Case discussed with Dr. Charlcie Cradle, MD [facility] [phone] 06/06/2013, 5:29 PM

## 2013-06-06 NOTE — Procedures (Signed)
I was present at this dialysis session, have reviewed the session itself and made  appropriate changes  Kelly Splinter MD (pgr) [phone]    [phone] 06/06/2013, 11:28 AM

## 2013-06-07 DIAGNOSIS — I10 Essential (primary) hypertension: Secondary | ICD-10-CM | POA: Diagnosis present

## 2013-06-07 DIAGNOSIS — E1165 Type 2 diabetes mellitus with hyperglycemia: Secondary | ICD-10-CM

## 2013-06-07 DIAGNOSIS — IMO0001 Reserved for inherently not codable concepts without codable children: Secondary | ICD-10-CM

## 2013-06-07 DIAGNOSIS — F0150 Vascular dementia without behavioral disturbance: Secondary | ICD-10-CM | POA: Diagnosis present

## 2013-06-07 DIAGNOSIS — I639 Cerebral infarction, unspecified: Secondary | ICD-10-CM | POA: Diagnosis present

## 2013-06-07 LAB — LIPID PANEL
Cholesterol: 196 mg/dL (ref 0–200)
HDL: 37 mg/dL — ABNORMAL LOW (ref >39)
LDL Cholesterol: 109 mg/dL — ABNORMAL HIGH (ref 0–99)
Total CHOL/HDL Ratio: 5.3 RATIO
Triglycerides: 251 mg/dL — ABNORMAL HIGH (ref <150)
VLDL: 50 mg/dL — ABNORMAL HIGH (ref 0–40)

## 2013-06-07 LAB — GLUCOSE, CAPILLARY
Glucose-Capillary: 85 mg/dL (ref 70–99)
Glucose-Capillary: 127 mg/dL — ABNORMAL HIGH (ref 70–99)

## 2013-06-07 MED ORDER — NEPRO/CARBSTEADY PO LIQD: 237.0000 mL | Freq: Two times a day (BID) | ORAL | Status: DC

## 2013-06-07 MED ORDER — LISINOPRIL 10 MG PO TABS
10.0000 mg | ORAL_TABLET | Freq: Every day | ORAL | Status: DC
  Administered 2013-06-07: 10 mg via ORAL
  Filled 2013-06-07: qty 1

## 2013-06-07 MED ORDER — LISINOPRIL 10 MG PO TABS: 10.0000 mg | ORAL_TABLET | Freq: Every day | ORAL | Status: DC

## 2013-06-07 MED ORDER — AMLODIPINE BESYLATE 10 MG PO TABS: 10.0000 mg | ORAL_TABLET | Freq: Every day | ORAL | Status: AC

## 2013-06-07 MED ORDER — CLOPIDOGREL BISULFATE 75 MG PO TABS: 75.0000 mg | ORAL_TABLET | Freq: Every day | ORAL | Status: DC

## 2013-06-07 MED ORDER — AMLODIPINE BESYLATE 5 MG PO TABS: 5.0000 mg | ORAL_TABLET | Freq: Every day | ORAL | Status: DC

## 2013-06-07 NOTE — Discharge Summary (Addendum)
Physician Discharge Summary  [name] [id number] DOB: [birth year] DOA: 06/03/2013  PCP: Marco Collie, MD  Admit date: 06/03/2013 Discharge date: 06/07/2013  Time spent: 35 minutes  Recommendations for Outpatient Follow-up:  1. Please follow up on patient's blood pressures closely, because of upward trend in blood pressures I increased Norvasc to 10mg  PO q daily on 06/07/13. 2. Accuchecks qAC    Discharge Diagnoses:  Principal Problem:   Acute encephalopathy Active Problems:   Acute CVA (cerebrovascular accident)   Vascular dementia   Chronic kidney disease, stage IV (severe)   Type II or unspecified type diabetes mellitus without mention of complication, not stated as uncontrolled   HTN (hypertension)   Discharge Condition: Stable  Diet recommendation: Heart Healthy  Filed Weights   06/04/13 1640 06/06/13 0649 06/06/13 1044  Weight: 56.9 kg (125 lb 7.1 oz) 57.5 kg (126 lb 12.2 oz) 56.6 kg (124 lb 12.5 oz)    History of present illness:  [name] is a 63 y.o. female who presented to ER with AMS for 2-3 days. She was just d/c'd from the hospital 3/2 with similar complaints- Cause was found to be uremic encephalopathy.  Per records at ER in [place], family statesL patient has not been conversing and has been "nearly" unconscious for several days. It was also reported, patient has bipolar disorder and was recently placed on haldol  Past medical history of end-stage renal disease dialyzes Tuesday Thursdays and Saturdays, diabetes hypertension and coronary artery disease.  CT scan done in [place], does show multiple infarcts of indeterminate age  Labs show a mildly elevated WBC count. Myoglobin was also elevated. K was 4   Hospital Course:  Patient is a pleasant 63 year old female with a past medical history of end-stage renal disease, hypertension, diabetes mellitus, history of CVA, who was admitted to the medicine service on 06/03/2013 presenting with mental status  changes. Family had reported that mental status changes started 2-3 days prior to admission. Patient's recently discharged on 05/27/2013 at which time she presented with mental status changes felt to be secondary to uremic encephalopathy. During this hospitalization she was seen by nephrology as she underwent hemodialysis. Mental status changes improving. On 06/05/2013 she had an MRI of brain which showed an acute nonhemorrhagic infarct involving the right lateral thalamus measuring 6 mm. Also noted were multiple remote lacunar infarcts present in the basal ganglia and thalami bilaterally. Carotid Doppler showed 1-39% stenosis involving the right internal carotid artery and the left internal carotid artery. She had a transthoracic echocardiogram that was done on 06/03/2013 that showed an ejection fraction 55-60%. I discussed case with neurology on 06/06/2013 who recommended the discontinuation of aspirin and starting Plavix 75 mg by mouth daily. Due to elevated blood pressures, I increased Norvasc to10 mg PO q daily on 06/07/13, as her blood pressures will need to be followed closely. She was discharged back to her SNF in stable condition on 06/07/13.    Procedures: Transthoracic echocardiogram performed on 06/03/2013 showed an ejection fraction 55-60%  Carotid Dopplers performed on 06/04/2013 showing 1-39% stenosis involving right internal carotid artery and left internal carotid artery   Consultations:  Nephrology  Neurology  Discharge Exam: Filed Vitals:   06/07/13 1000  BP: 168/62  Pulse: 91  Temp: 98 F (36.7 C)  Resp: 18    General: Patient is in no acute distress, remains confused and disoriented  Cardiovascular: Regular rate rhythm normal S1 is  Respiratory: Clear to auscultation bilaterally  Abdomen: Soft nontender nondistended  Musculoskeletal: No edema   Discharge Instructions      Discharge Orders   Future Appointments Provider Department Dept Phone   06/13/2013 10:45 AM  Elam Dutch, MD [facility] [phone]   Future Orders Complete By Expires   Call MD for:  difficulty breathing, headache or visual disturbances  As directed    Call MD for:  extreme fatigue  As directed    Call MD for:  persistant dizziness or light-headedness  As directed    Call MD for:  persistant nausea and vomiting  As directed    Call MD for:  severe uncontrolled pain  As directed    Call MD for:  temperature >100.4  As directed    Diet - low sodium heart healthy  As directed    Increase activity slowly  As directed        Medication List    STOP taking these medications       aspirin EC 81 MG tablet      TAKE these medications       amLODipine 10 MG tablet  Commonly known as:  NORVASC  Take 1 tablet (10 mg total) by mouth daily.     calcium acetate 667 MG capsule  Commonly known as:  PHOSLO  Take 1 capsule (667 mg total) by mouth 3 (three) times daily with meals.     clopidogrel 75 MG tablet  Commonly known as:  PLAVIX  Take 1 tablet (75 mg total) by mouth daily with breakfast.     feeding supplement (NEPRO CARB STEADY) Liqd  Take 237 mLs by mouth 2 (two) times daily between meals.     Insulin Detemir 100 UNIT/ML Pen  Commonly known as:  LEVEMIR FLEXPEN  Inject 14 Units into the skin daily at 10 pm.     multivitamin Tabs tablet  Take 1 tablet by mouth at bedtime.     simvastatin 20 MG tablet  Commonly known as:  ZOCOR  Take 1 tablet (20 mg total) by mouth daily at 6 PM.       No Known Allergies Follow-up Information   Follow up with HODGES,BETH, MD In 1 week.   Specialty:  Family Medicine   Contact information:   [address] [place] [phone]        The results of significant diagnostics from this hospitalization (including imaging, microbiology, ancillary and laboratory) are listed below for reference.    Significant Diagnostic Studies: Dg Chest 1 View  05/19/2013   CLINICAL DATA:   Confusion.  EXAM: CHEST - 1 VIEW  COMPARISON:  05/18/2013 and 08/06/2012  FINDINGS: There is a small patchy area of density in the left midzone. The lungs are otherwise clear. Heart size and vascularity are normal. No osseous abnormality.  IMPRESSION: Small new area of atelectasis/ infiltrate in the left midzone.   Electronically Signed   By: Rozetta Nunnery M.D.   On: 05/19/2013 20:41   Dg Chest 2 View  06/03/2013   CLINICAL DATA:  Stroke.  History of hypertension.  EXAM: CHEST  2 VIEW  COMPARISON:  06/02/2013  FINDINGS: Cardiac silhouette is borderline enlarged. Normal mediastinal and hilar contours. Clear lungs. No pleural effusion or pneumothorax.  Bony thorax is intact.  IMPRESSION: No active cardiopulmonary disease.   Electronically Signed   By: Lajean Manes M.D.   On: 06/03/2013 11:39   Ct Head Wo Contrast  05/19/2013   CLINICAL DATA:  Confusion.  Dialysis patient  EXAM: CT HEAD WITHOUT CONTRAST  TECHNIQUE: Contiguous axial images were obtained from the base of the skull through the vertex without intravenous contrast.  COMPARISON:  CT 05/18/2013  FINDINGS: Generalized atrophy. Moderate to advanced chronic microvascular ischemic changes in the white matter. Chronic ischemia in the thalamus bilaterally. These findings are stable.  Negative for acute infarct.  Negative for acute hemorrhage or mass.  Mucosal edema in the paranasal sinuses. Air-fluid level left maxillary sinus. No acute skull abnormality. Atherosclerotic disease.  IMPRESSION: Atrophy and chronic microvascular ischemia. No acute intracranial abnormality.  Sinusitis with air-fluid level.   Electronically Signed   By: Franchot Gallo M.D.   On: 05/19/2013 12:53   Mr Jodene Nam Head Wo Contrast  06/05/2013   CLINICAL DATA Altered mental status. Acute encephalopathy following dialysis. Confusion.  EXAM MRI HEAD WITHOUT CONTRAST  MRA HEAD WITHOUT CONTRAST  TECHNIQUE Multiplanar, multiecho pulse sequences of the brain and surrounding structures were  obtained without intravenous contrast. Angiographic images of the head were obtained using MRA technique without contrast.  COMPARISON CT HEAD W/O CM dated 06/03/2013; MR HEAD W/O CM dated 09/07/2009  FINDINGS MRI HEAD FINDINGS  An acute nonhemorrhagic infarct involving the right lateral thalamus measures 6 mm. No other acute infarcts are present. Multiple remote lacunar infarcts are present in the basal ganglia and thalami bilaterally. Remote lacunar infarcts are evident within the coronal radiata as well. Remote lacunar infarcts of the brainstem are again noted. Atrophy and extensive bilateral periventricular and subcortical white matter disease is again noted. Infarcts involving the posterior right lentiform nucleus are not acute but are new since the prior MRI of 2011.  Flow is present in the major intracranial arteries. The patient is status post bilateral lens replacements. The globes and orbits are otherwise intact. Chronic left sphenoid sinus disease is again noted. The paranasal sinuses are otherwise clear. There is some fluid in the left mastoid air cells. No obstructing nasopharyngeal lesion is evident.  MRA HEAD FINDINGS  A moderate stenosis is present in the precavernous left internal carotid artery. Additional atherosclerotic irregularity is present through the cavernous segments bilaterally without other significant stenoses greater than 50%. The A1 and M1 segments are somewhat irregular but without focal stenosis. The anterior communicating artery is patent. The MCA trifurcations are intact. There is significant attenuation of the inferior right M2 branch. Mild to moderate stenosis is present in the lateral left M2 branch proximally. Moderate small vessel changes are present bilaterally.  The left vertebral artery is dominant to the right. The PICA origins are visualized and normal. The basilar artery is within normal limits. The posterior cerebral arteries originate from the basilar tip. There is a  moderate stenosis in the right P1/ P2 segment. Moderate distal small vessel disease is evident bilaterally.  IMPRESSION 1. Acute nonhemorrhagic infarct involving the right lateral thalamus. 2. Multiple remote lacunar infarcts are present in the basal ganglia bilaterally with some progression since 2011. 3. Atrophy and extensive white matter disease has progressed as well. 4. Moderate stenosis of the precavernous left internal carotid artery. 5. Moderate small vessel disease bilaterally, more severe anteriorly than posteriorly.  SIGNATURE  Electronically Signed   By: Lawrence Santiago M.D.   On: 06/05/2013 18:21   Mr Brain Wo Contrast  06/05/2013   CLINICAL DATA Altered mental status. Acute encephalopathy following dialysis. Confusion.  EXAM MRI HEAD WITHOUT CONTRAST  MRA HEAD WITHOUT CONTRAST  TECHNIQUE Multiplanar, multiecho pulse sequences of the brain and surrounding structures were obtained without intravenous contrast.  Angiographic images of the head were obtained using MRA technique without contrast.  COMPARISON CT HEAD W/O CM dated 06/03/2013; MR HEAD W/O CM dated 09/07/2009  FINDINGS MRI HEAD FINDINGS  An acute nonhemorrhagic infarct involving the right lateral thalamus measures 6 mm. No other acute infarcts are present. Multiple remote lacunar infarcts are present in the basal ganglia and thalami bilaterally. Remote lacunar infarcts are evident within the coronal radiata as well. Remote lacunar infarcts of the brainstem are again noted. Atrophy and extensive bilateral periventricular and subcortical white matter disease is again noted. Infarcts involving the posterior right lentiform nucleus are not acute but are new since the prior MRI of 2011.  Flow is present in the major intracranial arteries. The patient is status post bilateral lens replacements. The globes and orbits are otherwise intact. Chronic left sphenoid sinus disease is again noted. The paranasal sinuses are otherwise clear. There is some fluid  in the left mastoid air cells. No obstructing nasopharyngeal lesion is evident.  MRA HEAD FINDINGS  A moderate stenosis is present in the precavernous left internal carotid artery. Additional atherosclerotic irregularity is present through the cavernous segments bilaterally without other significant stenoses greater than 50%. The A1 and M1 segments are somewhat irregular but without focal stenosis. The anterior communicating artery is patent. The MCA trifurcations are intact. There is significant attenuation of the inferior right M2 branch. Mild to moderate stenosis is present in the lateral left M2 branch proximally. Moderate small vessel changes are present bilaterally.  The left vertebral artery is dominant to the right. The PICA origins are visualized and normal. The basilar artery is within normal limits. The posterior cerebral arteries originate from the basilar tip. There is a moderate stenosis in the right P1/ P2 segment. Moderate distal small vessel disease is evident bilaterally.  IMPRESSION 1. Acute nonhemorrhagic infarct involving the right lateral thalamus. 2. Multiple remote lacunar infarcts are present in the basal ganglia bilaterally with some progression since 2011. 3. Atrophy and extensive white matter disease has progressed as well. 4. Moderate stenosis of the precavernous left internal carotid artery. 5. Moderate small vessel disease bilaterally, more severe anteriorly than posteriorly.  SIGNATURE  Electronically Signed   By: Lawrence Santiago M.D.   On: 06/05/2013 18:21   Dg Chest Port 1 View  05/21/2013   CLINICAL DATA:  62 year old female with shortness of breath.  EXAM: PORTABLE CHEST - 1 VIEW  COMPARISON:  05/19/2013 and prior chest radiographs  FINDINGS: Cardiomegaly identified.  There is no evidence of focal airspace disease, pulmonary edema, suspicious pulmonary nodule/mass, pleural effusion, or pneumothorax. No acute bony abnormalities are identified.  IMPRESSION: Cardiomegaly without  evidence of active cardiopulmonary disease.   Electronically Signed   By: Hassan Rowan M.D.   On: 05/21/2013 21:00    Microbiology: No results found for this or any previous visit (from the past 240 hour(s)).   Labs: Basic Metabolic Panel:  Recent Labs Lab 06/03/13 1245 06/04/13 1130 06/06/13 0703  NA  --  140 137  K  --  4.4 4.2  CL  --  95* 92*  CO2  --  25 25  GLUCOSE  --  235* 153*  BUN  --  52* 44*  CREATININE 7.30* 8.84* 7.69*  CALCIUM  --  10.0 10.1  PHOS  --  4.9*  --    Liver Function Tests:  Recent Labs Lab 06/04/13 1130  AST 16  ALT 25  ALKPHOS 121*  BILITOT <0.2*  PROT 7.5  ALBUMIN  3.0*  3.0*   No results found for this basename: LIPASE, AMYLASE,  in the last 168 hours No results found for this basename: AMMONIA,  in the last 168 hours CBC:  Recent Labs Lab 06/03/13 1245 06/05/13 0718 06/06/13 0651  WBC 14.1* 10.0 8.3  HGB 12.5 11.9* 11.1*  HCT 35.7* 34.4* 32.5*  MCV 90.2 89.6 89.5  PLT 640* 619* 533*   Cardiac Enzymes: No results found for this basename: CKTOTAL, CKMB, CKMBINDEX, TROPONINI,  in the last 168 hours BNP: BNP (last 3 results) No results found for this basename: PROBNP,  in the last 8760 hours CBG:  Recent Labs Lab 06/05/13 2115 06/06/13 1212 06/06/13 1704 06/06/13 2012 06/07/13 0810  GLUCAP 242* 129* 151* 129* 85       Signed:  Armelia Penton  [facility] 06/07/2013, 11:42 AM

## 2013-06-07 NOTE — Progress Notes (Signed)
Patient discharged.  Patient bedside commode, shower chair, and wheelchair delivered to room.  Patient belongings gathered.  Patient and family member educated on discharge instructions, follow-up appointments, and discharge medications.  Prescriptions sent electronically to [facility].  Patient and family member verbalized understanding and signed AVS.  Telemetry box 8 removed and returned, CCMD notified.  Patient escorted out with RN and NT.

## 2013-06-07 NOTE — Progress Notes (Signed)
Subjective:  No cos [name] communicative  /tolerated hd yesterday Objective Vital signs in last 24 hours: Filed Vitals:   06/06/13 1234 06/06/13 1823 06/06/13 2014 06/07/13 0411  BP: 150/54 139/59 144/53 184/72  Pulse: 75 77 94 104  Temp: 98.8 F (37.1 C) 98.5 F (36.9 C) 98 F (36.7 C) 97.7 F (36.5 C)  TempSrc: Oral Oral Oral Oral  Resp: 18 16 16 16   Height:      Weight:      SpO2: 99% 99% 97% 100%  General:  Sitting in bedside chair/ alert NAD Heart: RRR 1/6 sem lsb no rub  Lungs: CTA Bilat   Abdomen:soft NT / ND Extremities: No LE edema; Dialysis Access: Left AVGG  Pos bruit Neuro: baseline / "'I"m at [facility]  in Morning, SATurday""  Dialysis: [facility]  3h 35min 58 kg 2K/2.0 [place] Heparin 6000 L arm AVG  Hectorol 1 Epo 1200 Venofer none   Assessment/Plan:  1.AMS- New acute nonhemorrhagic infarct involving the right lateral thalamus and  underlying Multi infarct dementia With element bipolar dz/ verified with op kid center did not miss hd, "has had some op episodes of A  2 ESRD on hemodialysis [facility]  3 HTN norvasc 5, BP's  Up today fu trend before incr to 10mg  Amldipine /below edw by~~2 kg but not standing weights - goal initially set for 1.5 = UF volume on Tuesday but BP dropped to low 100s; 5 Anemia Hb 12>11.9> 11.1 - drifting down - resume if drops <10.5 hold epo  6 2HPT cont phoslo ac, vit D; mild hypercalcemia 10.8/ phos 4.9 8 Hx bipolar disorder- no meds listed ./ ?haldol in past  9 DM on insulin -BS 200s on SSI  Labs: Basic Metabolic Panel:  Recent Labs Lab 06/03/13 1245 06/04/13 1130 06/06/13 0703  NA  --  140 137  K  --  4.4 4.2  CL  --  95* 92*  CO2  --  25 25  GLUCOSE  --  235* 153*  BUN  --  52* 44*  CREATININE 7.30* 8.84* 7.69*  CALCIUM  --  10.0 10.1  PHOS  --  4.9*  --    Liver Function Tests:  Recent Labs Lab 06/04/13 1130  AST 16  ALT 25  ALKPHOS 121*  BILITOT <0.2*  PROT 7.5  ALBUMIN 3.0*  3.0*    CBC:  Recent Labs Lab 06/03/13 1245 06/05/13 0718 06/06/13 0651  WBC 14.1* 10.0 8.3  HGB 12.5 11.9* 11.1*  HCT 35.7* 34.4* 32.5*  MCV 90.2 89.6 89.5  PLT 640* 619* 533*  CBG:  Recent Labs Lab 06/05/13 2115 06/06/13 1212 06/06/13 1704 06/06/13 2012 06/07/13 0810  GLUCAP 242* 129* 151* 129* 85   Medications:   . amLODipine  5 mg Oral QHS  . calcium acetate  667 mg Oral TID WC  . clopidogrel  75 mg Oral Q breakfast  . doxercalciferol  1 mcg Intravenous Q T,Th,Sa-HD  . feeding supplement (NEPRO CARB STEADY)  237 mL Oral BID BM  . heparin  5,000 Units Subcutaneous 3 times per day  . insulin aspart  0-5 Units Subcutaneous QHS  . insulin aspart  0-9 Units Subcutaneous TID WC  . insulin aspart  5 Units Subcutaneous TID WC  . insulin detemir  14 Units Subcutaneous QHS  . lisinopril  10 mg Oral Daily  . multivitamin  1 tablet Oral QHS  . simvastatin  20 mg Oral q1800    Ernest Haber,  PA-C [place] [phone] 06/07/2013,10:01 AM  LOS: 4 days   Pt seen, examined and agree w A/P as above.  Kelly Splinter MD pager [phone]    cell [phone] 06/08/2013, 1:23 PM

## 2013-06-07 NOTE — Clinical Social Work Note (Signed)
Patient admitted from [facility] skilled nursing facility. CSW informed by patient's granddaughter Tobie Poet [phone]) on 3/11 that patient will discharge home. Patient lives with her daughter [name]. Patient discharged home today with [facility] services (3/13) and was transported home by family.  Nari Vannatter Givens, MSW, LCSW [phone]

## 2013-06-07 NOTE — Progress Notes (Signed)
Physical Therapy Treatment Patient Details Name: [name] MRN: [id number] DOB: 02-Apr-1950 Today's Date: 06/07/2013 Time: 6440-3474 PT Time Calculation (min): 23 min  PT Assessment / Plan / Recommendation  History of Present Illness [name] is a 63 y.o. female who presented to ER with AMS for 2-3 days.  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      PT Comments   Patient making gains with mobility and gait.  Will have f/u HHPT at discharge.  Follow Up Recommendations  Home health PT;Supervision/Assistance - 24 hour     Does the patient have the potential to tolerate intense rehabilitation     Barriers to Discharge        Equipment Recommendations  Wheelchair (measurements PT);3in1 (PT)    Recommendations for Other Services    Frequency Min 3X/week   Progress towards PT Goals Progress towards PT goals: Progressing toward goals  Plan Current plan remains appropriate    Precautions / Restrictions Precautions Precautions: Fall Restrictions Weight Bearing Restrictions: No   Pertinent Vitals/Pain     Mobility  Transfers Overall transfer level: Needs assistance Equipment used: Rolling walker (2 wheeled) Transfers: Sit to/from Stand Sit to Stand: Min assist General transfer comment: Verbal cues for hand placement.  Increased time to come fully upright needs cues for hip extension  Ambulation/Gait Ambulation/Gait assistance: Min assist Ambulation Distance (Feet): 60 Feet Assistive device: Rolling walker (2 wheeled) Gait Pattern/deviations: Step-through pattern;Decreased step length - right;Decreased step length - left;Shuffle Gait velocity: decreased Gait velocity interpretation: Below normal speed for age/gender General Gait Details: Cues for safe use of RW.  Assist to maneuver RW.  Cues to stay close to RW and to look up during gait.      PT Goals (current goals can now be found in the  care plan section)    Visit Information  Last PT Received On: 06/07/13 Assistance Needed: +1 History of Present Illness: [name] is a 63 y.o. female who presented to ER with AMS for 2-3 days.  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       Subjective Data  Subjective: My daughter got me a wheelchair today.   Cognition  Cognition Arousal/Alertness: Awake/alert Behavior During Therapy: WFL for tasks assessed/performed Overall Cognitive Status: Impaired/Different from baseline Area of Impairment: Following commands;Safety/judgement;Problem solving Following Commands: Follows one step commands with increased time Safety/Judgement: Decreased awareness of safety Problem Solving: Slow processing;Difficulty sequencing    Balance     End of Session PT - End of Session Equipment Utilized During Treatment: Gait belt Activity Tolerance: Patient limited by fatigue Patient left: in chair;with call bell/phone within reach;with chair alarm set Nurse Communication: Mobility status   GP     Despina Pole 06/07/2013, 11:56 AM Carita Pian. Sanjuana Kava, [place] Pager [phone]

## 2013-06-07 NOTE — Care Management Note (Signed)
   CARE MANAGEMENT NOTE 06/07/2013  Patient:  [name], [name]   Account Number:  [id number]  Date Initiated:  06/03/2013  Documentation initiated by:  [facility]  Subjective/Objective Assessment:   63 y.o. year-old with DM, HTN, ESRD sent to ED with AMS for 2 days.//From SNF     Action/Plan:   tele  -FLP, HgbA1C  -neuro checks  -stroke swallow screen//Return to [facility]  06/07/2013 Plan now to return to home, family to [place] and DME ordered.   Anticipated DC Date:  06/07/2013   Anticipated DC Plan:  [place]  In-house referral  Clinical Social Worker      DC Planning Services  CM consult      Choice offered to / List presented to:     DME arranged  3-N-1  [place]      DME agency  [place] arranged  HH-1 RN  [place].   Status of service:  Completed, signed off Medicare Important Message given?   (If response is "NO", the following Medicare IM given date fields will be blank) Date Medicare IM given:   Date Additional Medicare IM given:    Discharge Disposition:  [place]  Per UR Regulation:    If discussed at Long Length of Stay Meetings, dates discussed:    Comments:   06/07/2013 Spoke with pt granddaughter , Mendel Corning re d/c needs, pt needs 3:1, shower stool and a wheelchair. HHRN arranged with [facility], pt has [facility] aide with [facility]. Granddaughter will contact that agency re [facility] aide and CM will fax any info needed by that agency as requested. Pt will qualify for HHPT as well as HHRN due to acute CVA. CRoyaL RN MPH, case manager, [phone]   contact: Mendel Corning  granddaughter  [phone]  06/05/2013  [place], [place]  [phone] spoek with Mendel Corning regarding discharge plans. They have 24/7 care at home, aide from [facility] 2.5 hours everyday will need 3:1, wheelchair If order for [facility] would chose [facility].  MD updated via text.

## 2013-06-12 ENCOUNTER — Encounter: Payer: Self-pay | Admitting: Vascular Surgery

## 2013-06-13 ENCOUNTER — Ambulatory Visit (INDEPENDENT_AMBULATORY_CARE_PROVIDER_SITE_OTHER): Payer: Medicare Other | Admitting: Vascular Surgery

## 2013-06-13 ENCOUNTER — Other Ambulatory Visit: Payer: Self-pay | Admitting: *Deleted

## 2013-06-13 ENCOUNTER — Encounter: Payer: Self-pay | Admitting: Vascular Surgery

## 2013-06-13 VITALS — BP 135/48 | HR 87 | Temp 97.9°F | Ht 63.0 in | Wt 124.0 lb

## 2013-06-13 DIAGNOSIS — I739 Peripheral vascular disease, unspecified: Secondary | ICD-10-CM | POA: Insufficient documentation

## 2013-06-13 MED ORDER — ACETAMINOPHEN-CODEINE #3 300-30 MG PO TABS: 1.0000 | ORAL_TABLET | Freq: Four times a day (QID) | ORAL | Status: DC | PRN

## 2013-06-13 NOTE — Progress Notes (Signed)
[facility] [place] Consult note   History of Present Illness:  Patient is a 64 y.o. year old female who presents for evaluation of right first toe pain.  She was recently seen in the hospital for this problem but had altered mental status at that time. She was admitted with altered mental status after missing several days of dialysis.  She was oriented today.  She complains of pain in her right first toe that has been present several weeks.  She denies claudication but does not ambulate much.  She is able to get around somewhat with a walker. She is overall deconditioned and quite frail. No open wounds.  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  All of these are currently stable.  She dialyzes Tuesday Thursday Saturday.    Past Medical History   Diagnosis  Date   .  Diabetes mellitus     .  Hypertension     .  CHF (congestive heart failure)     .  Hyperlipidemia     .  Chronic kidney disease     .  Thyroid disease     .  Depression     .  Shortness of breath         at night   .  Arthritis     .  Dementia     .  Stroke         2013, weakness- all over   .  Constipation     .  Vision disturbance         Past Surgical History   Procedure  Laterality  Date   .  Arteriovenous graft placement           left upper extremity   .  Tubal ligation           x2   .  Av fistula placement  Left  07/02/2012       Procedure: ARTERIOVENOUS (AV) FISTULA CREATION;  Surgeon: Conrad Media, MD;  Location: [place];  Service: Vascular;  Laterality: Left;   .  Av fistula placement  Left  10/01/2012       Procedure: INSERTION OF ARTERIOVENOUS (AV) GORE-TEX GRAFT LEFT UPPER ARM;  Surgeon: Conrad [place], MD;  Location: [place];  Service: Vascular;  Laterality: Left;  Ultrasound guided     Social History History   Substance Use Topics   .  Smoking status:  Never Smoker    .  Smokeless tobacco:  Never Used   .   Alcohol Use:  No     Family History Family History   Problem  Relation  Age of Onset   .  Diabetes  Father     .  Stroke  Father     .  Heart disease  Mother       Allergies  No Known Allergies     Current Outpatient Prescriptions on File Prior to Visit  Medication Sig Dispense Refill  . amLODipine (NORVASC) 10 MG tablet Take 1 tablet (10 mg total) by mouth daily.  30 tablet  1  . calcium acetate (PHOSLO) 667 MG capsule Take 1 capsule (667 mg total) by mouth 3 (three) times daily with meals.  90 capsule  0  . clopidogrel (PLAVIX) 75 MG tablet Take 1 tablet (75 mg total) by mouth daily with breakfast.  30 tablet  1  . Insulin Detemir (LEVEMIR FLEXPEN)  100 UNIT/ML Pen Inject 14 Units into the skin daily at 10 pm.  15 mL  11  . multivitamin (RENA-VIT) TABS tablet Take 1 tablet by mouth at bedtime.  30 tablet  0  . Nutritional Supplements (FEEDING SUPPLEMENT, NEPRO CARB STEADY,) LIQD Take 237 mLs by mouth 2 (two) times daily between meals.  30 Can  0  . simvastatin (ZOCOR) 20 MG tablet Take 1 tablet (20 mg total) by mouth daily at 6 PM.  30 tablet  0   No current facility-administered medications on file prior to visit.    ROS:    She denies chest pain. She does have shortness of breath with exertion.  Physical Examination    Filed Vitals:   06/13/13 1122  BP: 135/48  Pulse: 87  Temp: 97.9 F (36.6 C)  TempSrc: Oral  Height: 5\' 3"  (1.6 m)  Weight: 124 lb (56.246 kg)  SpO2: 100%    General:  Alert and oriented , no acute distress HEENT: Normal Neck: No bruit or JVD Pulmonary: Clear to auscultation bilaterally Cardiac: Regular Rate and Rhythm Abdomen: Soft, non-tender, non-distended, no mass Skin: No rash Extremity Pulses:  2+ radial, brachial, femoral, absent dorsalis pedis, posterior tibial pulses bilaterally Musculoskeletal: No deformity or edema,  Right first toe slight erythema no ulcer or gangrene          Neurologic: Upper and lower extremity motor 5/5 and  symmetric . ASSESSMENT:  Clincal evidence of PAD mental status not improved enough to consider arteriogram. She is a poor candidate for open revascularization. She may be a candidate for percutaneous angioplasty or stenting. Arteriogram will be scheduled for in the next few weeks. Risks benefits possible complications and procedure details of the arteriogram as well as options for revascularization were discussed with patient and her daughter today.  Ruta Hinds, MD [facility] [place] Office: [phone] Pager: [phone]

## 2013-06-14 ENCOUNTER — Telehealth: Payer: Self-pay | Admitting: *Deleted

## 2013-06-14 NOTE — Telephone Encounter (Signed)
Sela Hilding (pt.'s daughter and emergency contact) states that her mother has had an upset stomach today and needs anti nausea medication.  She saw Dr. Oneida Alar yesterday and is scheduled for abdominal aortagram on 06-21-3013.  Dr. Oneida Alar (06-13-2013) prescribed Tylenol #3 for pain and Ms. Andres Labrum states her mother's nausea began after taking Tylenol #3.  Discussed Mrs. [name]'s history and symptoms with Dr. Bridgett Larsson who recommended that she stop Tylenol #3 and contact the physician that manages her dialysis/ kidney dysfunction if nausea persists.  Mrs. Andres Labrum verbalized understanding.

## 2013-06-20 ENCOUNTER — Telehealth: Payer: Self-pay

## 2013-06-20 MED ORDER — SODIUM CHLORIDE 0.9 % IV SOLN: 250.0000 mL | INTRAVENOUS | Status: DC | PRN

## 2013-06-20 MED ORDER — SODIUM CHLORIDE 0.9 % IJ SOLN: 3.0000 mL | Freq: Two times a day (BID) | INTRAMUSCULAR | Status: DC

## 2013-06-20 MED ORDER — SODIUM CHLORIDE 0.9 % IJ SOLN: 3.0000 mL | INTRAMUSCULAR | Status: DC | PRN

## 2013-06-20 NOTE — Telephone Encounter (Signed)
Phone call from pt's daughter, [name].  Reported that pt. was evaluated at the [facility] ER for "UTI and toe infection."  Was placed on an antibiotic and began vomiting after taking it.  Stated pt. is very weak, and not able to go through with the angiogram tomorrow.  Will call back at a later date to reschedule.  Advised daughter will cancel the angiogram for 3/27.

## 2013-06-21 ENCOUNTER — Ambulatory Visit (HOSPITAL_COMMUNITY): Admission: RE | Admit: 2013-06-21 | Payer: Medicare Other | Source: Ambulatory Visit | Admitting: Vascular Surgery

## 2013-06-21 ENCOUNTER — Encounter (HOSPITAL_COMMUNITY): Admission: RE | Payer: Self-pay | Source: Ambulatory Visit

## 2013-06-21 SURGERY — ABDOMINAL AORTAGRAM
Anesthesia: LOCAL

## 2013-08-14 ENCOUNTER — Ambulatory Visit (INDEPENDENT_AMBULATORY_CARE_PROVIDER_SITE_OTHER): Payer: Medicare Other

## 2013-08-14 VITALS — BP 140/65 | HR 103 | Resp 18

## 2013-08-14 DIAGNOSIS — E1149 Type 2 diabetes mellitus with other diabetic neurological complication: Secondary | ICD-10-CM

## 2013-08-14 DIAGNOSIS — E1141 Type 2 diabetes mellitus with diabetic mononeuropathy: Secondary | ICD-10-CM

## 2013-08-14 DIAGNOSIS — I739 Peripheral vascular disease, unspecified: Secondary | ICD-10-CM

## 2013-08-14 DIAGNOSIS — M79609 Pain in unspecified limb: Secondary | ICD-10-CM

## 2013-08-14 DIAGNOSIS — E1140 Type 2 diabetes mellitus with diabetic neuropathy, unspecified: Secondary | ICD-10-CM

## 2013-08-14 DIAGNOSIS — S90129A Contusion of unspecified lesser toe(s) without damage to nail, initial encounter: Secondary | ICD-10-CM

## 2013-08-14 DIAGNOSIS — L608 Other nail disorders: Secondary | ICD-10-CM

## 2013-08-14 NOTE — Patient Instructions (Signed)
Diabetes and Foot Care Diabetes may cause you to have problems because of poor blood supply (circulation) to your feet and legs. This may cause the skin on your feet to become thinner, break easier, and heal more slowly. Your skin may become dry, and the skin may peel and crack. You may also have nerve damage in your legs and feet causing decreased feeling in them. You may not notice minor injuries to your feet that could lead to infections or more serious problems. Taking care of your feet is one of the most important things you can do for yourself.  HOME CARE INSTRUCTIONS  Wear shoes at all times, even in the house. Do not go barefoot. Bare feet are easily injured.  Check your feet daily for blisters, cuts, and redness. If you cannot see the bottom of your feet, use a mirror or ask someone for help.  Wash your feet with warm water (do not use hot water) and mild soap. Then pat your feet and the areas between your toes until they are completely dry. Do not soak your feet as this can dry your skin.  Apply a moisturizing lotion or petroleum jelly (that does not contain alcohol and is unscented) to the skin on your feet and to dry, brittle toenails. Do not apply lotion between your toes.  Trim your toenails straight across. Do not dig under them or around the cuticle. File the edges of your nails with an emery board or nail file.  Do not cut corns or calluses or try to remove them with medicine.  Wear clean socks or stockings every day. Make sure they are not too tight. Do not wear knee-high stockings since they may decrease blood flow to your legs.  Wear shoes that fit properly and have enough cushioning. To break in new shoes, wear them for just a few hours a day. This prevents you from injuring your feet. Always look in your shoes before you put them on to be sure there are no objects inside.  Do not cross your legs. This may decrease the blood flow to your feet.  If you find a minor scrape,  cut, or break in the skin on your feet, keep it and the skin around it clean and dry. These areas may be cleansed with mild soap and water. Do not cleanse the area with peroxide, alcohol, or iodine.  When you remove an adhesive bandage, be sure not to damage the skin around it.  If you have a wound, look at it several times a day to make sure it is healing.  Do not use heating pads or hot water bottles. They may burn your skin. If you have lost feeling in your feet or legs, you may not know it is happening until it is too late.  Make sure your health care provider performs a complete foot exam at least annually or more often if you have foot problems. Report any cuts, sores, or bruises to your health care provider immediately. SEEK MEDICAL CARE IF:   You have an injury that is not healing.  You have cuts or breaks in the skin.  You have an ingrown nail.  You notice redness on your legs or feet.  You feel burning or tingling in your legs or feet.  You have pain or cramps in your legs and feet.  Your legs or feet are numb.  Your feet always feel cold. SEEK IMMEDIATE MEDICAL CARE IF:   There is increasing redness,   swelling, or pain in or around a wound.  There is a red line that goes up your leg.  Pus is coming from a wound.  You develop a fever or as directed by your health care provider.  You notice a bad smell coming from an ulcer or wound. Document Released: 03/11/2000 Document Revised: 11/14/2012 Document Reviewed: 08/21/2012 Exit[employer] Patient Information 2014 [employer].  

## 2013-08-14 NOTE — Progress Notes (Signed)
   Subjective:    Patient ID: Billy Fischer, female    DOB: 12/30/1950, 63 y.o.   MRN: [id number]  HPI I have some pain on both my big toenails and I had a guy in a salon cut them and he cut in the corners and I have been a diabetic since 1989 and my feet are cold and there is some throbbing to them and my feet are red and then it goes away and then comes back and is on dialysis three times a week and my feet do stay swollen some    Review of Systems  Constitutional: Positive for fatigue.  Respiratory: Positive for cough.   Endocrine: Positive for cold intolerance.  Neurological: Positive for dizziness and weakness.  Hematological: Bruises/bleeds easily.  All other systems reviewed and are negative.      Objective:   Physical Exam 63 year old [place] American female presents this time in a wheelchair patient is to be a relatively thin not well oriented this time in some distress due to pain burning and hypersensitivity in both feet. She is unable to walk is transported wheelchair does have diabetic shoes and has significant pain most significant is the hallux bilateral are untouched all her toenails are painful and symptomatic and just the slightest touch. Next  Lower extremity objective findings as follows vascular status is diminished with dorsalis pedis thready plus one over 4 PT nonpalpable bilateral temperature warm to cool turgor diminished there is no edema mild varicosities noted no rubor pallor noted neurologically epicritic and proprioceptive sensations diminished on Semmes Weinstein testing to forefoot digits with hypersensitivity to the toes on palpation and attempted nail debridement or attempt range of motion patient is also having spasming in her foot arthropathy exam and shoes dorsally contracting her toes fighting against any touch to the foot with dorsally extending the toes she also does is currently with walking causing contusion to the right hallux nail bed nail plate and  distal tuft of left hallux as well right is more severe than left with clinical lysis or separation of the nail plate from the nailbed nails thick criptotic incurvated in for friable 1 through 5 bilateral again absent hair growth diminished skin texture and turgor no open wounds ulcerations no secondary infections evidence of contusion to right hallux is noted there is hypersensitivity and any touch or palpation at this time.       Assessment & Plan:  Assessment diabetes with peripheral neuropathy and neuralgia or neuritis both lower extremities particular toes at this time the nails are debrided with some slight distress to the patient patient has severe hypersensitivity did recommended and discussed with the nephrologist or the kidney Dr. possibilities of different meds management for pain the patient allows and Tylenol 3 suggest we talked about plantar sedative which may reduce with neuropathy if able to take this based on her other health conditions and as well as renal failure. At this time painful criptotic incurvated friable nails 1 through 5 bilateral are debrided there is onychomycosis with separation of the right hallux nail plate and nailbed about half nails debrided away cleansed thoroughly daily reappointed one are reappointed 3 months for further followup and continued diabetic foot nail care is needed in the future  Harriet Masson DPM

## 2013-09-18 ENCOUNTER — Encounter: Payer: Self-pay | Admitting: Vascular Surgery

## 2013-09-19 ENCOUNTER — Ambulatory Visit (INDEPENDENT_AMBULATORY_CARE_PROVIDER_SITE_OTHER): Payer: Medicare Other | Admitting: Vascular Surgery

## 2013-09-19 ENCOUNTER — Encounter: Payer: Self-pay | Admitting: Vascular Surgery

## 2013-09-19 ENCOUNTER — Other Ambulatory Visit: Payer: Self-pay | Admitting: *Deleted

## 2013-09-19 VITALS — BP 137/61 | HR 94 | Ht 63.0 in | Wt 124.0 lb

## 2013-09-19 DIAGNOSIS — I739 Peripheral vascular disease, unspecified: Secondary | ICD-10-CM

## 2013-09-19 MED ORDER — CLOPIDOGREL BISULFATE 75 MG PO TABS: 75.0000 mg | ORAL_TABLET | Freq: Every day | ORAL | Status: AC

## 2013-09-19 NOTE — Addendum Note (Signed)
Addended by: Mena Goes on: 09/19/2013 03:23 PM   Modules accepted: Orders

## 2013-09-19 NOTE — Progress Notes (Signed)
[facility] [place] Consult note   History of Present Illness:  Patient is a 63 y.o. year old female who presents for evaluation of right first toe pain.  She was recently seen in the hospital for this problem but had altered mental status at that time. She was admitted with altered mental status after missing several days of dialysis.  She was seen approximately 2 months ago and thought that time to still be to decondition for further evaluation. She was oriented today.  She complains of pain in her right first toe that has been present several months.  She denies claudication but does not ambulate much.  She is essentially wheelchair-bound but is able to get around somewhat with a walker. She is overall deconditioned and quite frail.  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  All of these are currently stable.  She dialyzes Tuesday Thursday Saturday.    Past Medical History    Diagnosis   Date    .   Diabetes mellitus       .   Hypertension       .   CHF (congestive heart failure)       .   Hyperlipidemia       .   Chronic kidney disease       .   Thyroid disease       .   Depression       .   Shortness of breath             at night    .   Arthritis       .   Dementia       .   Stroke             2013, weakness- all over    .   Constipation       .   Vision disturbance           Past Surgical History    Procedure   Laterality   Date    .   Arteriovenous graft placement                left upper extremity    .   Tubal ligation                x2    .   Av fistula placement   Left   07/02/2012          Procedure: ARTERIOVENOUS (AV) FISTULA CREATION;  Surgeon: Conrad Tensas, MD;  Location: [place];  Service: Vascular;  Laterality: Left;    .   Av fistula placement   Left   10/01/2012          Procedure: INSERTION OF ARTERIOVENOUS (AV) GORE-TEX GRAFT LEFT UPPER ARM;  Surgeon: Conrad [place],  MD;  Location: [place];  Service: Vascular;  Laterality: Left;  Ultrasound guided      Social History History    Substance Use Topics    .   Smoking status:   Never Smoker     .   Smokeless tobacco:   Never Used    .   Alcohol Use:   No      Family History Family History    Problem   Relation   Age of Onset    .   Diabetes   Father       .  Stroke   Father       .   Heart disease   Mother         Allergies  No Known Allergies Current Outpatient Prescriptions on File Prior to Visit  Medication Sig Dispense Refill  . acetaminophen-codeine (TYLENOL #3) 300-30 MG per tablet Take 1-2 tablets by mouth every 6 (six) hours as needed for moderate pain.  40 tablet  0  . amLODipine (NORVASC) 10 MG tablet Take 1 tablet (10 mg total) by mouth daily.  30 tablet  1  . Insulin Detemir (LEVEMIR FLEXPEN) 100 UNIT/ML Pen Inject 14 Units into the skin daily at 10 pm.  15 mL  11  . simvastatin (ZOCOR) 20 MG tablet Take 1 tablet (20 mg total) by mouth daily at 6 PM.  30 tablet  0  . calcium acetate (PHOSLO) 667 MG capsule Take 1 capsule (667 mg total) by mouth 3 (three) times daily with meals.  90 capsule  0  . cephALEXin (KEFLEX) 250 MG capsule       . clopidogrel (PLAVIX) 75 MG tablet Take 1 tablet (75 mg total) by mouth daily with breakfast.  30 tablet  1  . lisinopril (PRINIVIL,ZESTRIL) 10 MG tablet       . multivitamin (RENA-VIT) TABS tablet Take 1 tablet by mouth at bedtime.  30 tablet  0  . nitrofurantoin, macrocrystal-monohydrate, (MACROBID) 100 MG capsule       . Nutritional Supplements (FEEDING SUPPLEMENT, NEPRO CARB STEADY,) LIQD Take 237 mLs by mouth 2 (two) times daily between meals.  30 Can  0  . ZOFRAN 4 MG tablet        No current facility-administered medications on file prior to visit.   ROS:    She denies chest pain. She does have shortness of breath with exertion.  Physical Examination     Filed Vitals:   09/19/13 1003  BP: 137/61  Pulse: 94  Height: 5\' 3"  (1.6 m)   Weight: 124 lb (56.246 kg)  SpO2: 100%    General:  Alert and oriented , no acute distress HEENT: Normal Neck: No bruit or JVD Pulmonary: Clear to auscultation bilaterally Cardiac: Regular Rate and Rhythm Abdomen: Soft, non-tender, non-distended, no mass Skin: No rash Extremity Pulses:  2+ radial, brachial, femoral, absent dorsalis pedis, posterior tibial pulses bilaterally Musculoskeletal: No deformity or edema,  Right first toe 2 cm superficial ulceration at the tip          Neurologic: Upper and lower extremity motor 5/5 and symmetric . ASSESSMENT:  Clincal evidence of PAD.  She is a poor candidate for open revascularization. She may be a candidate for percutaneous angioplasty or stenting. Her granddaughter was here for the office visit today and we had lengthy discussion in both agreed that she is not a candidate for open revascularization or any extensive operation due to her deconditioning. Arteriogram will be scheduled for July 24. Risks benefits possible complications and procedure details of the arteriogram as well as options for revascularization were discussed with patient and her granddaughter today.  Ruta Hinds, MD [facility] [place] Office: [phone] Pager: [phone]

## 2013-10-16 ENCOUNTER — Encounter (HOSPITAL_COMMUNITY): Payer: Self-pay

## 2013-10-18 ENCOUNTER — Encounter (HOSPITAL_COMMUNITY)
Admission: RE | Disposition: A | Discharge status: Home / Self Care | Payer: Self-pay | Source: Ambulatory Visit | Attending: Vascular Surgery

## 2013-10-18 ENCOUNTER — Ambulatory Visit (HOSPITAL_COMMUNITY)
Admission: RE | Admit: 2013-10-18 | Discharge: 2013-10-18 | Disposition: A | Discharge status: Home / Self Care | Payer: Medicare Other | Source: Ambulatory Visit | Attending: Vascular Surgery | Admitting: Vascular Surgery

## 2013-10-18 DIAGNOSIS — Z7902 Long term (current) use of antithrombotics/antiplatelets: Secondary | ICD-10-CM | POA: Diagnosis not present

## 2013-10-18 DIAGNOSIS — R5381 Other malaise: Secondary | ICD-10-CM | POA: Insufficient documentation

## 2013-10-18 DIAGNOSIS — Z992 Dependence on renal dialysis: Secondary | ICD-10-CM | POA: Insufficient documentation

## 2013-10-18 DIAGNOSIS — F0390 Unspecified dementia without behavioral disturbance: Secondary | ICD-10-CM | POA: Insufficient documentation

## 2013-10-18 DIAGNOSIS — N186 End stage renal disease: Secondary | ICD-10-CM | POA: Diagnosis not present

## 2013-10-18 DIAGNOSIS — I509 Heart failure, unspecified: Secondary | ICD-10-CM | POA: Insufficient documentation

## 2013-10-18 DIAGNOSIS — I120 Hypertensive chronic kidney disease with stage 5 chronic kidney disease or end stage renal disease: Secondary | ICD-10-CM | POA: Diagnosis not present

## 2013-10-18 DIAGNOSIS — I70229 Atherosclerosis of native arteries of extremities with rest pain, unspecified extremity: Secondary | ICD-10-CM

## 2013-10-18 DIAGNOSIS — Z8673 Personal history of transient ischemic attack (TIA), and cerebral infarction without residual deficits: Secondary | ICD-10-CM | POA: Diagnosis not present

## 2013-10-18 DIAGNOSIS — E785 Hyperlipidemia, unspecified: Secondary | ICD-10-CM | POA: Diagnosis not present

## 2013-10-18 DIAGNOSIS — Z794 Long term (current) use of insulin: Secondary | ICD-10-CM | POA: Diagnosis not present

## 2013-10-18 DIAGNOSIS — E119 Type 2 diabetes mellitus without complications: Secondary | ICD-10-CM | POA: Insufficient documentation

## 2013-10-18 HISTORY — PX: ABDOMINAL AORTAGRAM: SHX5454

## 2013-10-18 LAB — POCT I-STAT, CHEM 8
BUN: 26 mg/dL — ABNORMAL HIGH (ref 6–23)
CALCIUM ION: 1.1 mmol/L — AB (ref 1.13–1.30)
CREATININE: 5.3 mg/dL — AB (ref 0.50–1.10)
Chloride: 101 mEq/L (ref 96–112)
GLUCOSE: 118 mg/dL — AB (ref 70–99)
HEMATOCRIT: 39 % (ref 36.0–46.0)
Hemoglobin: 13.3 g/dL (ref 12.0–15.0)
POTASSIUM: 5.8 meq/L — AB (ref 3.7–5.3)
Sodium: 136 mEq/L — ABNORMAL LOW (ref 137–147)
TCO2: 30 mmol/L (ref 0–100)

## 2013-10-18 LAB — GLUCOSE, CAPILLARY: Glucose-Capillary: 96 mg/dL (ref 70–99)

## 2013-10-18 SURGERY — ABDOMINAL AORTAGRAM
Anesthesia: LOCAL

## 2013-10-18 MED ORDER — SODIUM CHLORIDE 0.9 % IJ SOLN: 3.0000 mL | INTRAMUSCULAR | Status: DC | PRN

## 2013-10-18 MED ORDER — SODIUM CHLORIDE 0.9 % IV SOLN: 250.0000 mL | INTRAVENOUS | Status: DC | PRN

## 2013-10-18 MED ORDER — ACETAMINOPHEN 325 MG RE SUPP: 325.0000 mg | RECTAL | Status: DC | PRN

## 2013-10-18 MED ORDER — HYDRALAZINE HCL 20 MG/ML IJ SOLN
INTRAMUSCULAR | Status: AC
Start: 2013-10-18 — End: 2013-10-18
  Filled 2013-10-18: qty 1

## 2013-10-18 MED ORDER — HYDRALAZINE HCL 20 MG/ML IJ SOLN: 10.0000 mg | INTRAMUSCULAR | Status: DC | PRN

## 2013-10-18 MED ORDER — FENTANYL CITRATE 0.05 MG/ML IJ SOLN
INTRAMUSCULAR | Status: AC
  Filled 2013-10-18: qty 2

## 2013-10-18 MED ORDER — METOPROLOL TARTRATE 1 MG/ML IV SOLN: 2.0000 mg | INTRAVENOUS | Status: DC | PRN

## 2013-10-18 MED ORDER — MORPHINE SULFATE 10 MG/ML IJ SOLN: 2.0000 mg | INTRAMUSCULAR | Status: DC | PRN

## 2013-10-18 MED ORDER — OXYCODONE HCL 5 MG PO TABS: 5.0000 mg | ORAL_TABLET | ORAL | Status: DC | PRN

## 2013-10-18 MED ORDER — SODIUM CHLORIDE 0.9 % IJ SOLN: 3.0000 mL | Freq: Two times a day (BID) | INTRAMUSCULAR | Status: DC

## 2013-10-18 MED ORDER — ONDANSETRON HCL 4 MG/2ML IJ SOLN: 4.0000 mg | Freq: Four times a day (QID) | INTRAMUSCULAR | Status: DC | PRN

## 2013-10-18 MED ORDER — OXYCODONE HCL 5 MG PO CAPS: 5.0000 mg | ORAL_CAPSULE | ORAL | Status: DC | PRN

## 2013-10-18 MED ORDER — SODIUM POLYSTYRENE SULFONATE 15 GM/60ML PO SUSP: 15.0000 g | Freq: Once | ORAL | Status: DC

## 2013-10-18 MED ORDER — LABETALOL HCL 5 MG/ML IV SOLN: 10.0000 mg | INTRAVENOUS | Status: DC | PRN

## 2013-10-18 MED ORDER — ACETAMINOPHEN 325 MG PO TABS: 325.0000 mg | ORAL_TABLET | ORAL | Status: DC | PRN

## 2013-10-18 NOTE — Interval H&P Note (Signed)
History and Physical Interval Note:  10/18/2013 9:55 AM  [name]  has presented today for surgery, with the diagnosis of pvd w/ulcer right toe  The various methods of treatment have been discussed with the patient and family. After consideration of risks, benefits and other options for treatment, the patient has consented to  Procedure(s): ABDOMINAL AORTAGRAM (N/A) as a surgical intervention .  The patient's history has been reviewed, patient examined, no change in status, stable for surgery.  I have reviewed the patient's chart and labs.  Questions were answered to the patient's satisfaction.     FIELDS,CHARLES E

## 2013-10-18 NOTE — H&P (View-Only) (Signed)
[facility] [place] Consult note   History of Present Illness:  Patient is a 63 y.o. year old female who presents for evaluation of right first toe pain.  She was recently seen in the hospital for this problem but had altered mental status at that time. She was admitted with altered mental status after missing several days of dialysis.  She was seen approximately 2 months ago and thought that time to still be to decondition for further evaluation. She was oriented today.  She complains of pain in her right first toe that has been present several months.  She denies claudication but does not ambulate much.  She is essentially wheelchair-bound but is able to get around somewhat with a walker. She is overall deconditioned and quite frail.  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  All of these are currently stable.  She dialyzes Tuesday Thursday Saturday.    Past Medical History    Diagnosis   Date    .   Diabetes mellitus       .   Hypertension       .   CHF (congestive heart failure)       .   Hyperlipidemia       .   Chronic kidney disease       .   Thyroid disease       .   Depression       .   Shortness of breath             at night    .   Arthritis       .   Dementia       .   Stroke             2013, weakness- all over    .   Constipation       .   Vision disturbance           Past Surgical History    Procedure   Laterality   Date    .   Arteriovenous graft placement                left upper extremity    .   Tubal ligation                x2    .   Av fistula placement   Left   07/02/2012          Procedure: ARTERIOVENOUS (AV) FISTULA CREATION;  Surgeon: Conrad Ipava, MD;  Location: [place];  Service: Vascular;  Laterality: Left;    .   Av fistula placement   Left   10/01/2012          Procedure: INSERTION OF ARTERIOVENOUS (AV) GORE-TEX GRAFT LEFT UPPER ARM;  Surgeon: Conrad ,  MD;  Location: [place];  Service: Vascular;  Laterality: Left;  Ultrasound guided      Social History History    Substance Use Topics    .   Smoking status:   Never Smoker     .   Smokeless tobacco:   Never Used    .   Alcohol Use:   No      Family History Family History    Problem   Relation   Age of Onset    .   Diabetes   Father       .  Stroke   Father       .   Heart disease   Mother         Allergies  No Known Allergies Current Outpatient Prescriptions on File Prior to Visit  Medication Sig Dispense Refill  . acetaminophen-codeine (TYLENOL #3) 300-30 MG per tablet Take 1-2 tablets by mouth every 6 (six) hours as needed for moderate pain.  40 tablet  0  . amLODipine (NORVASC) 10 MG tablet Take 1 tablet (10 mg total) by mouth daily.  30 tablet  1  . Insulin Detemir (LEVEMIR FLEXPEN) 100 UNIT/ML Pen Inject 14 Units into the skin daily at 10 pm.  15 mL  11  . simvastatin (ZOCOR) 20 MG tablet Take 1 tablet (20 mg total) by mouth daily at 6 PM.  30 tablet  0  . calcium acetate (PHOSLO) 667 MG capsule Take 1 capsule (667 mg total) by mouth 3 (three) times daily with meals.  90 capsule  0  . cephALEXin (KEFLEX) 250 MG capsule       . clopidogrel (PLAVIX) 75 MG tablet Take 1 tablet (75 mg total) by mouth daily with breakfast.  30 tablet  1  . lisinopril (PRINIVIL,ZESTRIL) 10 MG tablet       . multivitamin (RENA-VIT) TABS tablet Take 1 tablet by mouth at bedtime.  30 tablet  0  . nitrofurantoin, macrocrystal-monohydrate, (MACROBID) 100 MG capsule       . Nutritional Supplements (FEEDING SUPPLEMENT, NEPRO CARB STEADY,) LIQD Take 237 mLs by mouth 2 (two) times daily between meals.  30 Can  0  . ZOFRAN 4 MG tablet        No current facility-administered medications on file prior to visit.   ROS:    She denies chest pain. She does have shortness of breath with exertion.  Physical Examination     Filed Vitals:   09/19/13 1003  BP: 137/61  Pulse: 94  Height: 5\' 3"  (1.6 m)   Weight: 124 lb (56.246 kg)  SpO2: 100%    General:  Alert and oriented , no acute distress HEENT: Normal Neck: No bruit or JVD Pulmonary: Clear to auscultation bilaterally Cardiac: Regular Rate and Rhythm Abdomen: Soft, non-tender, non-distended, no mass Skin: No rash Extremity Pulses:  2+ radial, brachial, femoral, absent dorsalis pedis, posterior tibial pulses bilaterally Musculoskeletal: No deformity or edema,  Right first toe 2 cm superficial ulceration at the tip          Neurologic: Upper and lower extremity motor 5/5 and symmetric . ASSESSMENT:  Clincal evidence of PAD.  She is a poor candidate for open revascularization. She may be a candidate for percutaneous angioplasty or stenting. Her granddaughter was here for the office visit today and we had lengthy discussion in both agreed that she is not a candidate for open revascularization or any extensive operation due to her deconditioning. Arteriogram will be scheduled for July 24. Risks benefits possible complications and procedure details of the arteriogram as well as options for revascularization were discussed with patient and her granddaughter today.  Ruta Hinds, MD [facility] [place] Office: [phone] Pager: [phone]

## 2013-10-18 NOTE — Op Note (Signed)
Procedure: Aortogram with bilateral lower extremity runoff  Preoperative diagnosis: Rest pain right foot  Postoperative diagnosis: Same  Anesthesia: Local with IV sedation  Operative details: After obtaining informed consent, the patient was taken to the [facility]. The patient was placed in supine position the Angio table. Both groins were prepped and draped in usual sterile fashion. Ultrasound was used to identify the left common femoral artery. Local anesthesia was insured of the left common femoral artery. Ultrasound was used to identify the left common femoral artery and this was cannulated under ultrasound guidance with an introducer needle. 035 first core was noted in the bowel aorta under fluoroscopic guidance. A 5 French sheath was to the guidewire and the left common femoral artery. This was thoroughly flushed with heparinized saline. A 5 French catheter was then threaded over the guidewire into the abdominal aorta. An abdominal aortogram was obtained. The abdominal aorta is patent. The left and right common external and internal iliac arteries are patent. Next the pigtail catheter was pulled down to the aortic bifurcation and lower extremity runoff views were performed.  In the right lower extremity, the right common femoral artery is patent. There is a 30% stenosis of the origin of the right superficial femoral artery. The profunda femoris artery is patent. The right superficial femoral artery has several areas of 80% stenosis. The popliteal artery is severely diseased and is approximately 80% diffuse all the way to the takeoff of the tibial vessels. The anterior tibial artery is occluded. The peroneal artery is occluded. The posterior tibial artery is occluded at its origin but does reconstitute in the mid leg distally and is the only vessel filling the foot on the right side.  In the left lower extremity, the left common femoral artery is patent. The left superficial femoral artery is patent.  There is again diffuse disease of the left popliteal artery with 80% diffuse stenosis throughout its entire course. The anterior tibial artery is occluded. The posterior tibial artery is occluded. The peroneal artery is occluded proximally but does reconstitute distally and is diminutive. There is minimal filling of the left foot.  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  Operative management: The patient is not a candidate for a bypass procedure due to her overall debility nonambulatory status. She has severe popliteal and tibial disease which is not amenable to percutaneous treatment. Her only option would be narcotic pain control for now for her right first toe. If the pain becomes more severe over time not manageable by small doses of narcotics or she develops nonhealing wounds or gangrenous changes she would need a primary amputation.  Ruta Hinds, MD [facility] Office: [phone] Pager: [phone]

## 2013-10-18 NOTE — Discharge Instructions (Signed)
Angiogram, Care After ° °Refer to this sheet in the next few weeks. These instructions provide you with information on caring for yourself after your procedure. Your health care provider may also give you more specific instructions. Your treatment has been planned according to current medical practices, but problems sometimes occur. Call your health care provider if you have any problems or questions after your procedure.  °WHAT TO EXPECT AFTER THE PROCEDURE °After your procedure, it is typical to have the following sensations: °· Minor discomfort or tenderness and a small bump at the catheter insertion site. The bump should usually decrease in size and tenderness within 1 to 2 weeks. °· Any bruising will usually fade within 2 to 4 weeks. °HOME CARE INSTRUCTIONS  °· You may need to keep taking blood thinners if they were prescribed for you. Take medicines only as directed by your health care provider. °· Do not apply powder or lotion to the site. °· Do not take baths, swim, or use a hot tub until your health care provider approves. °· You may shower 24 hours after the procedure. Remove the bandage (dressing) and gently wash the site with plain soap and water. Gently pat the site dry. °· Inspect the site at least twice daily. °· Limit your activity for the first 24 hours. Do not bend, squat, or lift anything over 10 lb (9 kg) or as directed by your health care provider. °· Plan to have someone take you home after the procedure. Follow instructions about when you can drive or return to work. °SEEK MEDICAL CARE IF: °· You get light-headed when standing up. °· You have drainage (other than a small amount of blood on the dressing). °· You have chills. °· You have a fever. °· You have redness, warmth, swelling, or pain at the insertion site. °SEEK IMMEDIATE MEDICAL CARE IF:  °· You develop chest pain or shortness of breath, feel faint, or pass out. °· You have bleeding, swelling larger than a walnut, or drainage from the  catheter insertion site. °· You develop pain, discoloration, coldness, or severe bruising in the leg or arm that held the catheter. °· You have heavy bleeding from the site. If this happens, hold pressure on the site. °MAKE SURE YOU: °· Understand these instructions. °· Will watch your condition. °· Will get help right away if you are not doing well or get worse. °Document Released: 09/30/2004 Document Revised: 07/29/2013 Document Reviewed: 08/06/2012 °ExitCare® Patient Information ©2015 [employer]. This information is not intended to replace advice given to you by your health care provider. Make sure you discuss any questions you have with your health care provider. ° °

## 2013-11-04 ENCOUNTER — Other Ambulatory Visit: Payer: Self-pay | Admitting: *Deleted

## 2013-11-04 ENCOUNTER — Telehealth: Payer: Self-pay | Admitting: *Deleted

## 2013-11-04 DIAGNOSIS — I70269 Atherosclerosis of native arteries of extremities with gangrene, unspecified extremity: Secondary | ICD-10-CM

## 2013-11-04 MED ORDER — OXYCODONE HCL 5 MG PO CAPS: 5.0000 mg | ORAL_CAPSULE | Freq: Three times a day (TID) | ORAL | Status: AC

## 2013-11-04 NOTE — Telephone Encounter (Signed)
Daughter, Tobie Poet, called for mother needing another refill of Oxycodone IR 5 mg. She is taking 3 pills a day and this is holding her pain. Per [facility] note from aortogram, narcotic refills in small amount are OK until pain gets to bad and patient will have primary amputation. Daughter to come by tomorrow and pick up Rx that Dr. Trula Slade signed. Patient is afebrile and shows no signs of infection per [place].

## 2013-12-03 ENCOUNTER — Encounter (HOSPITAL_COMMUNITY): Payer: Self-pay | Admitting: Emergency Medicine

## 2013-12-03 ENCOUNTER — Emergency Department (HOSPITAL_COMMUNITY)
Admission: EM | Admit: 2013-12-03 | Discharge: 2013-12-04 | Disposition: A | Discharge status: Home / Self Care | Payer: Medicare Other | Attending: Emergency Medicine | Admitting: Emergency Medicine

## 2013-12-03 ENCOUNTER — Emergency Department (HOSPITAL_COMMUNITY): Payer: Medicare Other

## 2013-12-03 DIAGNOSIS — R5383 Other fatigue: Principal | ICD-10-CM

## 2013-12-03 DIAGNOSIS — E119 Type 2 diabetes mellitus without complications: Secondary | ICD-10-CM | POA: Insufficient documentation

## 2013-12-03 DIAGNOSIS — F0390 Unspecified dementia without behavioral disturbance: Secondary | ICD-10-CM | POA: Diagnosis not present

## 2013-12-03 DIAGNOSIS — E785 Hyperlipidemia, unspecified: Secondary | ICD-10-CM | POA: Diagnosis not present

## 2013-12-03 DIAGNOSIS — Z992 Dependence on renal dialysis: Secondary | ICD-10-CM | POA: Insufficient documentation

## 2013-12-03 DIAGNOSIS — Z79899 Other long term (current) drug therapy: Secondary | ICD-10-CM | POA: Insufficient documentation

## 2013-12-03 DIAGNOSIS — Z794 Long term (current) use of insulin: Secondary | ICD-10-CM | POA: Diagnosis not present

## 2013-12-03 DIAGNOSIS — Z8669 Personal history of other diseases of the nervous system and sense organs: Secondary | ICD-10-CM | POA: Insufficient documentation

## 2013-12-03 DIAGNOSIS — N19 Unspecified kidney failure: Secondary | ICD-10-CM | POA: Diagnosis not present

## 2013-12-03 DIAGNOSIS — R531 Weakness: Secondary | ICD-10-CM

## 2013-12-03 DIAGNOSIS — Z8739 Personal history of other diseases of the musculoskeletal system and connective tissue: Secondary | ICD-10-CM | POA: Insufficient documentation

## 2013-12-03 DIAGNOSIS — R5381 Other malaise: Secondary | ICD-10-CM | POA: Diagnosis not present

## 2013-12-03 DIAGNOSIS — I120 Hypertensive chronic kidney disease with stage 5 chronic kidney disease or end stage renal disease: Secondary | ICD-10-CM | POA: Insufficient documentation

## 2013-12-03 DIAGNOSIS — Z8673 Personal history of transient ischemic attack (TIA), and cerebral infarction without residual deficits: Secondary | ICD-10-CM | POA: Insufficient documentation

## 2013-12-03 DIAGNOSIS — Z7902 Long term (current) use of antithrombotics/antiplatelets: Secondary | ICD-10-CM | POA: Insufficient documentation

## 2013-12-03 DIAGNOSIS — Z8659 Personal history of other mental and behavioral disorders: Secondary | ICD-10-CM | POA: Diagnosis not present

## 2013-12-03 DIAGNOSIS — I509 Heart failure, unspecified: Secondary | ICD-10-CM | POA: Insufficient documentation

## 2013-12-03 DIAGNOSIS — Z8719 Personal history of other diseases of the digestive system: Secondary | ICD-10-CM | POA: Diagnosis not present

## 2013-12-03 DIAGNOSIS — N186 End stage renal disease: Secondary | ICD-10-CM | POA: Diagnosis not present

## 2013-12-03 LAB — COMPREHENSIVE METABOLIC PANEL WITH GFR
ALT: 9 U/L (ref 0–35)
AST: 13 U/L (ref 0–37)
Albumin: 3.5 g/dL (ref 3.5–5.2)
Alkaline Phosphatase: 125 U/L — ABNORMAL HIGH (ref 39–117)
Anion gap: 20 — ABNORMAL HIGH (ref 5–15)
BUN: 11 mg/dL (ref 6–23)
CO2: 25 meq/L (ref 19–32)
Calcium: 9.3 mg/dL (ref 8.4–10.5)
Chloride: 90 meq/L — ABNORMAL LOW (ref 96–112)
Creatinine, Ser: 3.57 mg/dL — ABNORMAL HIGH (ref 0.50–1.10)
GFR calc Af Amer: 15 mL/min — ABNORMAL LOW
GFR calc non Af Amer: 13 mL/min — ABNORMAL LOW
Glucose, Bld: 228 mg/dL — ABNORMAL HIGH (ref 70–99)
Potassium: 3.9 meq/L (ref 3.7–5.3)
Sodium: 135 meq/L — ABNORMAL LOW (ref 137–147)
Total Bilirubin: 0.3 mg/dL (ref 0.3–1.2)
Total Protein: 8.5 g/dL — ABNORMAL HIGH (ref 6.0–8.3)

## 2013-12-03 LAB — CBC WITH DIFFERENTIAL/PLATELET
Basophils Absolute: 0 K/uL (ref 0.0–0.1)
Basophils Relative: 0 % (ref 0–1)
Eosinophils Absolute: 0 K/uL (ref 0.0–0.7)
Eosinophils Relative: 0 % (ref 0–5)
HCT: 34.2 % — ABNORMAL LOW (ref 36.0–46.0)
Hemoglobin: 11.6 g/dL — ABNORMAL LOW (ref 12.0–15.0)
Lymphocytes Relative: 8 % — ABNORMAL LOW (ref 12–46)
Lymphs Abs: 1.2 K/uL (ref 0.7–4.0)
MCH: 28.6 pg (ref 26.0–34.0)
MCHC: 33.9 g/dL (ref 30.0–36.0)
MCV: 84.2 fL (ref 78.0–100.0)
Monocytes Absolute: 1.2 K/uL — ABNORMAL HIGH (ref 0.1–1.0)
Monocytes Relative: 8 % (ref 3–12)
Neutro Abs: 12.9 K/uL — ABNORMAL HIGH (ref 1.7–7.7)
Neutrophils Relative %: 84 % — ABNORMAL HIGH (ref 43–77)
Platelets: 418 K/uL — ABNORMAL HIGH (ref 150–400)
RBC: 4.06 MIL/uL (ref 3.87–5.11)
RDW: 13.8 % (ref 11.5–15.5)
WBC: 15.3 K/uL — ABNORMAL HIGH (ref 4.0–10.5)

## 2013-12-03 LAB — URINALYSIS, ROUTINE W REFLEX MICROSCOPIC
Bilirubin Urine: NEGATIVE
Glucose, UA: NEGATIVE mg/dL
Ketones, ur: NEGATIVE mg/dL
Nitrite: POSITIVE — AB
Protein, ur: 100 mg/dL — AB
Specific Gravity, Urine: 1.015 (ref 1.005–1.030)
Urobilinogen, UA: 0.2 mg/dL (ref 0.0–1.0)
pH: 6.5 (ref 5.0–8.0)

## 2013-12-03 LAB — URINE MICROSCOPIC-ADD ON

## 2013-12-03 NOTE — ED Notes (Signed)
Family at bedside. 

## 2013-12-03 NOTE — ED Provider Notes (Signed)
CSN: [id number]     Arrival date & time 12/03/13  2144 History   First MD Initiated Contact with Patient 12/03/13 2206     Chief Complaint  Patient presents with  . Weakness     (Consider location/radiation/quality/duration/timing/severity/associated sxs/prior Treatment) HPI The patient went to dialysis today and apparently had an uneventful dialysis session. Back at home she had an episode where she just didn't feel good. Her daughters report that she complained of pain basically all over for a period of time. This then resolved and at this point the patient doesn't have any complaints and they feel that she appears at her baseline state of health. Apparently at that time the patient is a generalized weakness and one episode of vomiting. She has not had fever chills or other signs Gen. constitutional illness developing.  Past Medical History  Diagnosis Date  . Diabetes mellitus   . Hypertension   . CHF (congestive heart failure)   . Hyperlipidemia   . End-stage renal disease on hemodialysis     Started dialysis in late 2014. Gets HD in [place] on TTS schedule.     . Thyroid disease   . Depression   . Shortness of breath     at night  . Arthritis   . Dementia   . Stroke     2013, weakness- all over  . Constipation   . Vision disturbance   . End stage renal disease 03/30/2011   Past Surgical History  Procedure Laterality Date  . Arteriovenous graft placement      left upper extremity  . Tubal ligation      x2  . Av fistula placement Left 07/02/2012    Procedure: ARTERIOVENOUS (AV) FISTULA CREATION;  Surgeon: Conrad Cando, MD;  Location: [facility];  Service: Vascular;  Laterality: Left;  . Av fistula placement Left 10/01/2012    Procedure: INSERTION OF ARTERIOVENOUS (AV) GORE-TEX GRAFT LEFT UPPER ARM;  Surgeon: Conrad Wolsey, MD;  [facility];  Service: Vascular;  Laterality: Left;  Ultrasound guided   Family History  Problem Relation Age of Onset  . Diabetes Father   . Stroke  Father   . Heart disease Mother    History  Substance Use Topics  . Smoking status: Never Smoker   . Smokeless tobacco: Never Used  . Alcohol Use: No   OB History   Grav Para Term Preterm Abortions TAB SAB Ect Mult Living                 Review of Systems   10 Systems reviewed and are negative for acute change except as noted in the HPI.  Allergies  Phenergan  Home Medications   Prior to Admission medications   Medication Sig Start Date End Date Taking? Authorizing Provider  amLODipine (NORVASC) 10 MG tablet Take 1 tablet (10 mg total) by mouth daily. 06/07/13   Kelvin Cellar, MD  clopidogrel (PLAVIX) 75 MG tablet Take 1 tablet (75 mg total) by mouth daily with breakfast. 09/19/13   Elam Dutch, MD  Insulin Detemir (LEVEMIR FLEXPEN) 100 UNIT/ML Pen Inject 14 Units into the skin daily at 10 pm. 05/27/13   Velta Addison Mikhail, DO  multivitamin (RENA-VIT) TABS tablet Take 1 tablet by mouth at bedtime. 05/24/13   Maryann Mikhail, DO  oxycodone (OXY-IR) 5 MG capsule Take 1 capsule (5 mg total) by mouth 3 (three) times daily. 11/04/13   Serafina Mitchell, MD  simvastatin (ZOCOR) 20 MG tablet Take 1 tablet (  20 mg total) by mouth daily at 6 PM. 06/05/13   Debbe Odea, MD  ZOFRAN 4 MG tablet Take 4 mg by mouth every 8 (eight) hours as needed for nausea or vomiting.  06/25/13   Historical Provider, MD   BP 131/63  Pulse 112  Temp(Src) 98.5 F (36.9 C) (Oral)  Resp 16  SpO2 100% Physical Exam  Constitutional: She is oriented to person, place, and time. She appears well-developed and well-nourished.  HENT:  Head: Normocephalic and atraumatic.  Eyes: EOM are normal. Pupils are equal, round, and reactive to light.  Neck: Neck supple.  Cardiovascular: Normal rate, regular rhythm, normal heart sounds and intact distal pulses.   Pulmonary/Chest: Effort normal and breath sounds normal.  Abdominal: Soft. Bowel sounds are normal. She exhibits no distension. There is no tenderness.   Musculoskeletal: Normal range of motion. She exhibits no edema.  Neurological: She is alert and oriented to person, place, and time. She has normal strength. Coordination normal. GCS eye subscore is 4. GCS verbal subscore is 5. GCS motor subscore is 6.  Skin: Skin is warm, dry and intact.  Psychiatric: She has a normal mood and affect.    ED Course  Procedures (including critical care time) Labs Review Labs Reviewed  CBC WITH DIFFERENTIAL  COMPREHENSIVE METABOLIC PANEL    Imaging Review No results found.   EKG Interpretation None      MDM   Final diagnoses:  Weakness  Kidney failure   Currently the patient's general appearance is good she is alert interactive and in no distress. Diagnostic studies do not show any significant change from baseline. The patient does have multiple medical comorbidities. She will be advised to followup with her family physician for recheck and certainly to return to the emergency department should there be any episodes of new or changing symptoms.    Charlesetta Shanks, MD 12/04/13 [phone]

## 2013-12-03 NOTE — ED Notes (Signed)
Patient presents to ED from home via [facility]. Patient went to dialysis today and afterwards felt weak and had 1 episode of vomiting on EMS truck. Patient is A&Ox4. No acute neuro deficits noted at this time. EMS administered 324 ASA. VSS.

## 2013-12-04 NOTE — Discharge Instructions (Signed)

## 2013-12-10 ENCOUNTER — Encounter (HOSPITAL_COMMUNITY): Payer: Self-pay | Admitting: *Deleted

## 2013-12-10 ENCOUNTER — Encounter (HOSPITAL_COMMUNITY): Payer: Self-pay | Admitting: Anesthesiology

## 2013-12-10 ENCOUNTER — Inpatient Hospital Stay (HOSPITAL_COMMUNITY)
Admission: AD | Admit: 2013-12-10 | Discharge: 2013-12-17 | DRG: 246 | Disposition: A | Discharge status: Home Health | Payer: Medicare Other | Source: Other Acute Inpatient Hospital | Attending: Internal Medicine | Admitting: Internal Medicine

## 2013-12-10 ENCOUNTER — Encounter (HOSPITAL_COMMUNITY)
Admission: AD | Disposition: A | Discharge status: Home Health | Payer: Self-pay | Source: Other Acute Inpatient Hospital | Attending: Internal Medicine

## 2013-12-10 ENCOUNTER — Inpatient Hospital Stay (HOSPITAL_COMMUNITY): Payer: Medicare Other

## 2013-12-10 DIAGNOSIS — I161 Hypertensive emergency: Secondary | ICD-10-CM | POA: Diagnosis present

## 2013-12-10 DIAGNOSIS — R072 Precordial pain: Secondary | ICD-10-CM

## 2013-12-10 DIAGNOSIS — I209 Angina pectoris, unspecified: Secondary | ICD-10-CM

## 2013-12-10 DIAGNOSIS — D72829 Elevated white blood cell count, unspecified: Secondary | ICD-10-CM | POA: Diagnosis present

## 2013-12-10 DIAGNOSIS — I509 Heart failure, unspecified: Secondary | ICD-10-CM | POA: Diagnosis present

## 2013-12-10 DIAGNOSIS — I672 Cerebral atherosclerosis: Secondary | ICD-10-CM | POA: Diagnosis present

## 2013-12-10 DIAGNOSIS — N058 Unspecified nephritic syndrome with other morphologic changes: Secondary | ICD-10-CM | POA: Diagnosis present

## 2013-12-10 DIAGNOSIS — N19 Unspecified kidney failure: Secondary | ICD-10-CM

## 2013-12-10 DIAGNOSIS — I120 Hypertensive chronic kidney disease with stage 5 chronic kidney disease or end stage renal disease: Secondary | ICD-10-CM | POA: Diagnosis present

## 2013-12-10 DIAGNOSIS — Z8673 Personal history of transient ischemic attack (TIA), and cerebral infarction without residual deficits: Secondary | ICD-10-CM

## 2013-12-10 DIAGNOSIS — I10 Essential (primary) hypertension: Secondary | ICD-10-CM

## 2013-12-10 DIAGNOSIS — E440 Moderate protein-calorie malnutrition: Secondary | ICD-10-CM | POA: Insufficient documentation

## 2013-12-10 DIAGNOSIS — I739 Peripheral vascular disease, unspecified: Secondary | ICD-10-CM

## 2013-12-10 DIAGNOSIS — G9340 Encephalopathy, unspecified: Secondary | ICD-10-CM

## 2013-12-10 DIAGNOSIS — G9341 Metabolic encephalopathy: Secondary | ICD-10-CM

## 2013-12-10 DIAGNOSIS — F329 Major depressive disorder, single episode, unspecified: Secondary | ICD-10-CM | POA: Diagnosis present

## 2013-12-10 DIAGNOSIS — Z7982 Long term (current) use of aspirin: Secondary | ICD-10-CM | POA: Diagnosis not present

## 2013-12-10 DIAGNOSIS — E1129 Type 2 diabetes mellitus with other diabetic kidney complication: Secondary | ICD-10-CM | POA: Diagnosis present

## 2013-12-10 DIAGNOSIS — M899 Disorder of bone, unspecified: Secondary | ICD-10-CM | POA: Diagnosis present

## 2013-12-10 DIAGNOSIS — Z794 Long term (current) use of insulin: Secondary | ICD-10-CM | POA: Diagnosis not present

## 2013-12-10 DIAGNOSIS — Z79899 Other long term (current) drug therapy: Secondary | ICD-10-CM

## 2013-12-10 DIAGNOSIS — I214 Non-ST elevation (NSTEMI) myocardial infarction: Principal | ICD-10-CM | POA: Diagnosis present

## 2013-12-10 DIAGNOSIS — R8290 Unspecified abnormal findings in urine: Secondary | ICD-10-CM | POA: Diagnosis present

## 2013-12-10 DIAGNOSIS — Z992 Dependence on renal dialysis: Secondary | ICD-10-CM | POA: Diagnosis not present

## 2013-12-10 DIAGNOSIS — E1165 Type 2 diabetes mellitus with hyperglycemia: Secondary | ICD-10-CM

## 2013-12-10 DIAGNOSIS — N184 Chronic kidney disease, stage 4 (severe): Secondary | ICD-10-CM

## 2013-12-10 DIAGNOSIS — R079 Chest pain, unspecified: Secondary | ICD-10-CM | POA: Diagnosis present

## 2013-12-10 DIAGNOSIS — T82898A Other specified complication of vascular prosthetic devices, implants and grafts, initial encounter: Secondary | ICD-10-CM

## 2013-12-10 DIAGNOSIS — F0150 Vascular dementia without behavioral disturbance: Secondary | ICD-10-CM | POA: Diagnosis present

## 2013-12-10 DIAGNOSIS — L97909 Non-pressure chronic ulcer of unspecified part of unspecified lower leg with unspecified severity: Secondary | ICD-10-CM | POA: Diagnosis present

## 2013-12-10 DIAGNOSIS — N2581 Secondary hyperparathyroidism of renal origin: Secondary | ICD-10-CM | POA: Diagnosis present

## 2013-12-10 DIAGNOSIS — M949 Disorder of cartilage, unspecified: Secondary | ICD-10-CM

## 2013-12-10 DIAGNOSIS — I70219 Atherosclerosis of native arteries of extremities with intermittent claudication, unspecified extremity: Secondary | ICD-10-CM

## 2013-12-10 DIAGNOSIS — I158 Other secondary hypertension: Secondary | ICD-10-CM

## 2013-12-10 DIAGNOSIS — Z7902 Long term (current) use of antithrombotics/antiplatelets: Secondary | ICD-10-CM

## 2013-12-10 DIAGNOSIS — I96 Gangrene, not elsewhere classified: Secondary | ICD-10-CM

## 2013-12-10 DIAGNOSIS — N186 End stage renal disease: Secondary | ICD-10-CM | POA: Diagnosis not present

## 2013-12-10 DIAGNOSIS — I70269 Atherosclerosis of native arteries of extremities with gangrene, unspecified extremity: Secondary | ICD-10-CM | POA: Diagnosis present

## 2013-12-10 DIAGNOSIS — I159 Secondary hypertension, unspecified: Secondary | ICD-10-CM

## 2013-12-10 DIAGNOSIS — E119 Type 2 diabetes mellitus without complications: Secondary | ICD-10-CM | POA: Diagnosis present

## 2013-12-10 DIAGNOSIS — D649 Anemia, unspecified: Secondary | ICD-10-CM | POA: Diagnosis present

## 2013-12-10 DIAGNOSIS — E1121 Type 2 diabetes mellitus with diabetic nephropathy: Secondary | ICD-10-CM | POA: Diagnosis present

## 2013-12-10 DIAGNOSIS — E785 Hyperlipidemia, unspecified: Secondary | ICD-10-CM | POA: Diagnosis present

## 2013-12-10 DIAGNOSIS — I639 Cerebral infarction, unspecified: Secondary | ICD-10-CM

## 2013-12-10 DIAGNOSIS — F0151 Vascular dementia with behavioral disturbance: Secondary | ICD-10-CM

## 2013-12-10 DIAGNOSIS — IMO0001 Reserved for inherently not codable concepts without codable children: Secondary | ICD-10-CM

## 2013-12-10 DIAGNOSIS — I2510 Atherosclerotic heart disease of native coronary artery without angina pectoris: Secondary | ICD-10-CM | POA: Diagnosis present

## 2013-12-10 DIAGNOSIS — F3289 Other specified depressive episodes: Secondary | ICD-10-CM | POA: Diagnosis present

## 2013-12-10 HISTORY — PX: LEFT HEART CATHETERIZATION WITH CORONARY ANGIOGRAM: SHX5451

## 2013-12-10 HISTORY — DX: Atherosclerotic heart disease of native coronary artery without angina pectoris: I25.10

## 2013-12-10 LAB — HEPARIN LEVEL (UNFRACTIONATED): Heparin Unfractionated: 0.81 IU/mL — ABNORMAL HIGH (ref 0.30–0.70)

## 2013-12-10 LAB — BASIC METABOLIC PANEL
Anion gap: 22 — ABNORMAL HIGH (ref 5–15)
BUN: 56 mg/dL — AB (ref 6–23)
CO2: 23 mEq/L (ref 19–32)
Calcium: 8.7 mg/dL (ref 8.4–10.5)
Chloride: 94 mEq/L — ABNORMAL LOW (ref 96–112)
Creatinine, Ser: 6.98 mg/dL — ABNORMAL HIGH (ref 0.50–1.10)
GFR calc non Af Amer: 6 mL/min — ABNORMAL LOW (ref >90)
GFR, EST AFRICAN AMERICAN: 7 mL/min — AB (ref >90)
GLUCOSE: 112 mg/dL — AB (ref 70–99)
POTASSIUM: 4.4 meq/L (ref 3.7–5.3)
Sodium: 139 mEq/L (ref 137–147)

## 2013-12-10 LAB — CBC
HCT: 22 % — ABNORMAL LOW (ref 36.0–46.0)
HEMATOCRIT: 24.8 % — AB (ref 36.0–46.0)
HEMOGLOBIN: 8.5 g/dL — AB (ref 12.0–15.0)
Hemoglobin: 7.4 g/dL — ABNORMAL LOW (ref 12.0–15.0)
MCH: 28.5 pg (ref 26.0–34.0)
MCH: 27.6 pg (ref 26.0–34.0)
MCHC: 34.3 g/dL (ref 30.0–36.0)
MCHC: 33.6 g/dL (ref 30.0–36.0)
MCV: 83.2 fL (ref 78.0–100.0)
MCV: 82.1 fL (ref 78.0–100.0)
PLATELETS: 417 10*3/uL — AB (ref 150–400)
Platelets: 453 10*3/uL — ABNORMAL HIGH (ref 150–400)
RBC: 2.98 MIL/uL — ABNORMAL LOW (ref 3.87–5.11)
RBC: 2.68 MIL/uL — AB (ref 3.87–5.11)
RDW: 14.3 % (ref 11.5–15.5)
RDW: 14.3 % (ref 11.5–15.5)
WBC: 14 10*3/uL — AB (ref 4.0–10.5)
WBC: 9.5 10*3/uL (ref 4.0–10.5)

## 2013-12-10 LAB — GLUCOSE, CAPILLARY
GLUCOSE-CAPILLARY: 107 mg/dL — AB (ref 70–99)
GLUCOSE-CAPILLARY: 110 mg/dL — AB (ref 70–99)
Glucose-Capillary: 121 mg/dL — ABNORMAL HIGH (ref 70–99)
Glucose-Capillary: 67 mg/dL — ABNORMAL LOW (ref 70–99)
Glucose-Capillary: 109 mg/dL — ABNORMAL HIGH (ref 70–99)

## 2013-12-10 LAB — POCT ACTIVATED CLOTTING TIME: ACTIVATED CLOTTING TIME: 112 s

## 2013-12-10 LAB — TROPONIN I
Troponin I: <0.3 ng/mL (ref <0.30)
Troponin I: <0.3 ng/mL (ref <0.30)

## 2013-12-10 LAB — PROTIME-INR
INR: 1.14 (ref 0.00–1.49)
PROTHROMBIN TIME: 14.6 s (ref 11.6–15.2)

## 2013-12-10 LAB — MRSA PCR SCREENING: MRSA by PCR: NEGATIVE

## 2013-12-10 SURGERY — LEFT HEART CATHETERIZATION WITH CORONARY ANGIOGRAM
Anesthesia: General

## 2013-12-10 MED ORDER — NA FERRIC GLUC CPLX IN SUCROSE 12.5 MG/ML IV SOLN
62.5000 mg | INTRAVENOUS | Status: DC
  Administered 2013-12-10: 62.5 mg via INTRAVENOUS
  Filled 2013-12-10 (×3): qty 5

## 2013-12-10 MED ORDER — ATORVASTATIN CALCIUM 40 MG PO TABS
40.0000 mg | ORAL_TABLET | Freq: Every day | ORAL | Status: DC
  Administered 2013-12-10 – 2013-12-17 (×8): 40 mg via ORAL
  Filled 2013-12-10 (×10): qty 1

## 2013-12-10 MED ORDER — HEPARIN (PORCINE) IN NACL 100-0.45 UNIT/ML-% IJ SOLN
INTRAMUSCULAR | Status: AC
  Administered 2013-12-10: 700 [IU]/h via INTRAVENOUS
  Filled 2013-12-10: qty 250

## 2013-12-10 MED ORDER — METOPROLOL TARTRATE 50 MG PO TABS
50.0000 mg | ORAL_TABLET | Freq: Two times a day (BID) | ORAL | Status: DC
Start: 2013-12-10 — End: 2013-12-11
  Administered 2013-12-10 – 2013-12-11 (×3): 50 mg via ORAL
  Filled 2013-12-10 (×4): qty 1

## 2013-12-10 MED ORDER — INSULIN ASPART 100 UNIT/ML ~~LOC~~ SOLN
0.0000 [IU] | SUBCUTANEOUS | Status: DC
  Administered 2013-12-11 – 2013-12-12 (×3): 2 [IU] via SUBCUTANEOUS
  Administered 2013-12-13: 3 [IU] via SUBCUTANEOUS
  Administered 2013-12-14: 1 [IU] via SUBCUTANEOUS

## 2013-12-10 MED ORDER — GLUCOSE-VITAMIN C 4-6 GM-MG PO CHEW: 4.0000 | CHEWABLE_TABLET | ORAL | Status: DC | PRN

## 2013-12-10 MED ORDER — HEPARIN (PORCINE) IN NACL 100-0.45 UNIT/ML-% IJ SOLN
600.0000 [IU]/h | INTRAMUSCULAR | Status: DC
  Administered 2013-12-10: 700 [IU]/h via INTRAVENOUS
  Filled 2013-12-10: qty 250

## 2013-12-10 MED ORDER — ONDANSETRON HCL 4 MG PO TABS: 4.0000 mg | ORAL_TABLET | Freq: Three times a day (TID) | ORAL | Status: DC | PRN

## 2013-12-10 MED ORDER — SODIUM CHLORIDE 0.9 % IJ SOLN: 3.0000 mL | Freq: Two times a day (BID) | INTRAMUSCULAR | Status: DC

## 2013-12-10 MED ORDER — INSULIN DETEMIR 100 UNIT/ML ~~LOC~~ SOLN
14.0000 [IU] | Freq: Every day | SUBCUTANEOUS | Status: DC
  Administered 2013-12-10: 14 [IU] via SUBCUTANEOUS
  Filled 2013-12-10 (×2): qty 0.14

## 2013-12-10 MED ORDER — NITROGLYCERIN IN D5W 200-5 MCG/ML-% IV SOLN
INTRAVENOUS | Status: AC
  Filled 2013-12-10: qty 250

## 2013-12-10 MED ORDER — LIDOCAINE HCL (PF) 1 % IJ SOLN
INTRAMUSCULAR | Status: AC
  Filled 2013-12-10: qty 30

## 2013-12-10 MED ORDER — ONDANSETRON HCL 4 MG/2ML IJ SOLN
4.0000 mg | Freq: Four times a day (QID) | INTRAMUSCULAR | Status: DC | PRN
  Administered 2013-12-12 (×2): 4 mg via INTRAVENOUS
  Filled 2013-12-10 (×2): qty 2

## 2013-12-10 MED ORDER — RENA-VITE PO TABS
1.0000 | ORAL_TABLET | Freq: Every day | ORAL | Status: DC
  Administered 2013-12-10 – 2013-12-16 (×7): 1 via ORAL
  Filled 2013-12-10 (×9): qty 1

## 2013-12-10 MED ORDER — SODIUM CHLORIDE 0.9 % IV SOLN: 100.0000 mL | INTRAVENOUS | Status: DC | PRN

## 2013-12-10 MED ORDER — SIMVASTATIN 20 MG PO TABS
20.0000 mg | ORAL_TABLET | Freq: Every day | ORAL | Status: DC
  Filled 2013-12-10: qty 1

## 2013-12-10 MED ORDER — AMLODIPINE BESYLATE 10 MG PO TABS
10.0000 mg | ORAL_TABLET | Freq: Every day | ORAL | Status: DC
  Filled 2013-12-10: qty 1

## 2013-12-10 MED ORDER — HEPARIN (PORCINE) IN NACL 2-0.9 UNIT/ML-% IJ SOLN
INTRAMUSCULAR | Status: AC
  Filled 2013-12-10: qty 1000

## 2013-12-10 MED ORDER — NITROGLYCERIN IN D5W 200-5 MCG/ML-% IV SOLN
2.0000 ug/min | INTRAVENOUS | Status: DC
  Administered 2013-12-10 (×2): 10 ug/min via INTRAVENOUS
  Administered 2013-12-12: 20 ug/min via INTRAVENOUS
  Filled 2013-12-10 (×2): qty 250

## 2013-12-10 MED ORDER — SODIUM CHLORIDE 0.9 % IV SOLN: 250.0000 mL | INTRAVENOUS | Status: DC | PRN

## 2013-12-10 MED ORDER — SODIUM CHLORIDE 0.9 % IV SOLN: INTRAVENOUS | Status: DC

## 2013-12-10 MED ORDER — INSULIN ASPART 100 UNIT/ML ~~LOC~~ SOLN: 0.0000 [IU] | Freq: Three times a day (TID) | SUBCUTANEOUS | Status: DC

## 2013-12-10 MED ORDER — LABETALOL HCL 5 MG/ML IV SOLN
10.0000 mg | INTRAVENOUS | Status: DC | PRN
  Administered 2013-12-10 – 2013-12-14 (×2): 10 mg via INTRAVENOUS
  Filled 2013-12-10 (×2): qty 4

## 2013-12-10 MED ORDER — KETOROLAC TROMETHAMINE 30 MG/ML IJ SOLN
30.0000 mg | Freq: Once | INTRAMUSCULAR | Status: AC
  Administered 2013-12-10: 30 mg via INTRAVENOUS
  Filled 2013-12-10: qty 1

## 2013-12-10 MED ORDER — ACETAMINOPHEN 325 MG PO TABS: 650.0000 mg | ORAL_TABLET | ORAL | Status: DC | PRN

## 2013-12-10 MED ORDER — DEXTROSE 50 % IV SOLN: 50.0000 mL | Freq: Once | INTRAVENOUS | Status: AC | PRN

## 2013-12-10 MED ORDER — MIDAZOLAM HCL 2 MG/2ML IJ SOLN
INTRAMUSCULAR | Status: AC
  Filled 2013-12-10: qty 2

## 2013-12-10 MED ORDER — METOPROLOL TARTRATE 25 MG PO TABS: 25.0000 mg | ORAL_TABLET | Freq: Two times a day (BID) | ORAL | Status: DC

## 2013-12-10 MED ORDER — HEPARIN (PORCINE) IN NACL 2-0.9 UNIT/ML-% IJ SOLN
INTRAMUSCULAR | Status: AC
  Filled 2013-12-10: qty 500

## 2013-12-10 MED ORDER — NEPRO/CARBSTEADY PO LIQD
237.0000 mL | ORAL | Status: DC | PRN
  Filled 2013-12-10: qty 237

## 2013-12-10 MED ORDER — SODIUM CHLORIDE 0.9 % IJ SOLN: 3.0000 mL | INTRAMUSCULAR | Status: DC | PRN

## 2013-12-10 MED ORDER — LIDOCAINE HCL (PF) 1 % IJ SOLN: 5.0000 mL | INTRAMUSCULAR | Status: DC | PRN

## 2013-12-10 MED ORDER — LIDOCAINE-PRILOCAINE 2.5-2.5 % EX CREA
1.0000 "application " | TOPICAL_CREAM | CUTANEOUS | Status: DC | PRN
  Filled 2013-12-10: qty 5

## 2013-12-10 MED ORDER — FENTANYL CITRATE 0.05 MG/ML IJ SOLN
INTRAMUSCULAR | Status: AC
  Filled 2013-12-10: qty 2

## 2013-12-10 MED ORDER — ASPIRIN 81 MG PO CHEW: 81.0000 mg | CHEWABLE_TABLET | ORAL | Status: DC

## 2013-12-10 MED ORDER — ALTEPLASE 2 MG IJ SOLR
2.0000 mg | Freq: Once | INTRAMUSCULAR | Status: AC | PRN
  Filled 2013-12-10: qty 2

## 2013-12-10 MED ORDER — CLOPIDOGREL BISULFATE 75 MG PO TABS
75.0000 mg | ORAL_TABLET | Freq: Every day | ORAL | Status: DC
  Administered 2013-12-10 – 2013-12-17 (×8): 75 mg via ORAL
  Filled 2013-12-10 (×12): qty 1

## 2013-12-10 MED ORDER — SODIUM CHLORIDE 0.9 % IJ SOLN
3.0000 mL | Freq: Two times a day (BID) | INTRAMUSCULAR | Status: DC
  Administered 2013-12-12 – 2013-12-15 (×3): 3 mL via INTRAVENOUS

## 2013-12-10 MED ORDER — CETYLPYRIDINIUM CHLORIDE 0.05 % MT LIQD
7.0000 mL | Freq: Two times a day (BID) | OROMUCOSAL | Status: DC
  Administered 2013-12-10 – 2013-12-17 (×14): 7 mL via OROMUCOSAL

## 2013-12-10 MED ORDER — HEPARIN SODIUM (PORCINE) 1000 UNIT/ML DIALYSIS: 1000.0000 [IU] | INTRAMUSCULAR | Status: DC | PRN

## 2013-12-10 MED ORDER — OXYCODONE HCL 5 MG PO TABS
5.0000 mg | ORAL_TABLET | Freq: Three times a day (TID) | ORAL | Status: DC
  Administered 2013-12-10 – 2013-12-14 (×10): 5 mg via ORAL
  Filled 2013-12-10 (×10): qty 1

## 2013-12-10 MED ORDER — ACETAMINOPHEN 325 MG PO TABS
650.0000 mg | ORAL_TABLET | ORAL | Status: DC | PRN
  Administered 2013-12-11 – 2013-12-17 (×10): 650 mg via ORAL
  Filled 2013-12-10 (×9): qty 2

## 2013-12-10 MED ORDER — HEPARIN (PORCINE) IN NACL 100-0.45 UNIT/ML-% IJ SOLN
700.0000 [IU]/h | INTRAMUSCULAR | Status: DC
  Administered 2013-12-11: 600 [IU]/h via INTRAVENOUS
  Filled 2013-12-10: qty 250

## 2013-12-10 MED ORDER — ASPIRIN EC 325 MG PO TBEC
325.0000 mg | DELAYED_RELEASE_TABLET | Freq: Every day | ORAL | Status: DC
  Administered 2013-12-10: 325 mg via ORAL
  Filled 2013-12-10: qty 1

## 2013-12-10 MED ORDER — CALCITRIOL 0.25 MCG PO CAPS
0.2500 ug | ORAL_CAPSULE | ORAL | Status: DC
  Administered 2013-12-12 – 2013-12-17 (×2): 0.25 ug via ORAL
  Filled 2013-12-10 (×4): qty 1

## 2013-12-10 MED ORDER — ASPIRIN EC 81 MG PO TBEC
81.0000 mg | DELAYED_RELEASE_TABLET | Freq: Every day | ORAL | Status: DC
  Administered 2013-12-10 – 2013-12-17 (×7): 81 mg via ORAL
  Filled 2013-12-10 (×10): qty 1

## 2013-12-10 MED ORDER — DARBEPOETIN ALFA-POLYSORBATE 100 MCG/0.5ML IJ SOLN
100.0000 ug | INTRAMUSCULAR | Status: DC
  Administered 2013-12-16: 100 ug via INTRAVENOUS
  Filled 2013-12-10: qty 0.5

## 2013-12-10 MED ORDER — GLUCOSE 40 % PO GEL: 1.0000 | ORAL | Status: DC | PRN

## 2013-12-10 MED ORDER — PENTAFLUOROPROP-TETRAFLUOROETH EX AERO: 1.0000 "application " | INHALATION_SPRAY | CUTANEOUS | Status: DC | PRN

## 2013-12-10 NOTE — Consult Note (Signed)
Indication for Consultation:  Management of ESRD/hemodialysis; anemia, hypertension/volume and secondary hyperparathyroidism  HPI: [name] is a 63 y.o. female who was transferred from [facility]. She receives HD TTS @ [facility]. History obtained from notes- patient initially presented to [facility] ED with complaints of chest pain, she was not given her scheduled Norvasc and her SBP was in the 220s, and she had elevated troponin, She was given nitro and labetalol and BP improved. Her daughter was also worried too much fluid may have been removed in dialysis on Saturday; 1.3 L was removed and she left under edw.  She missed HD on 9/10 because she wasn't feeling well. Plan for cath lab today with cardiology, will arrange HD after. She is unable to provide any history at this time, reports feeling well.   Past Medical History  Diagnosis Date  . Diabetes mellitus   . Hypertension   . CHF (congestive heart failure)   . Hyperlipidemia   . End-stage renal disease on hemodialysis     Started dialysis in late 2014. Gets HD in [place] on TTS schedule.     . Thyroid disease   . Depression   . Shortness of breath     at night  . Arthritis   . Dementia   . Stroke     2013, weakness- all over  . Constipation   . Vision disturbance   . End stage renal disease 03/30/2011  . Coronary artery disease    Past Surgical History  Procedure Laterality Date  . Arteriovenous graft placement      left upper extremity  . Tubal ligation      x2  . Av fistula placement Left 07/02/2012    Procedure: ARTERIOVENOUS (AV) FISTULA CREATION;  Surgeon: Conrad [place] Lake, MD;  Location: [facility];  Service: Vascular;  Laterality: Left;  . Av fistula placement Left 10/01/2012    Procedure: INSERTION OF ARTERIOVENOUS (AV) GORE-TEX GRAFT LEFT UPPER ARM;  Surgeon: Conrad Jetmore, MD;  Location: [facility];  Service: Vascular;  Laterality: Left;  Ultrasound guided   Family History  Problem Relation Age of Onset  . Diabetes  Father   . Stroke Father   . Heart disease Mother    Social History:  reports that she has never smoked. She has never used smokeless tobacco. She reports that she does not drink alcohol or use illicit drugs. Allergies  Allergen Reactions  . Phenergan [Promethazine Hcl]     unknown   Prior to Admission medications   Medication Sig Start Date End Date Taking? Authorizing Provider  amLODipine (NORVASC) 10 MG tablet Take 1 tablet (10 mg total) by mouth daily. 06/07/13  Yes Kelvin Cellar, MD  clopidogrel (PLAVIX) 75 MG tablet Take 1 tablet (75 mg total) by mouth daily with breakfast. 09/19/13  Yes Elam Dutch, MD  Insulin Detemir (LEVEMIR FLEXPEN) 100 UNIT/ML Pen Inject 14 Units into the skin daily at 10 pm. 05/27/13  Yes Maryann Mikhail, DO  multivitamin (RENA-VIT) TABS tablet Take 1 tablet by mouth at bedtime. 05/24/13  Yes Maryann Mikhail, DO  oxycodone (OXY-IR) 5 MG capsule Take 1 capsule (5 mg total) by mouth 3 (three) times daily. 11/04/13  Yes Serafina Mitchell, MD  simvastatin (ZOCOR) 20 MG tablet Take 1 tablet (20 mg total) by mouth daily at 6 PM. 06/05/13  Yes Debbe Odea, MD  ZOFRAN 4 MG tablet Take 4 mg by mouth every 8 (eight) hours as needed for nausea or vomiting.  06/25/13  Yes Historical Provider, MD   Current Facility-Administered Medications  Medication Dose Route Frequency Provider Last Rate Last Dose  . 0.9 %  sodium chloride infusion  250 mL Intravenous PRN Etta Quill, DO      . acetaminophen (TYLENOL) tablet 650 mg  650 mg Oral Q4H PRN Rhetta Mura Schorr, NP      . antiseptic oral rinse (CPC / CETYLPYRIDINIUM CHLORIDE 0.05%) solution 7 mL  7 mL Mouth Rinse BID Shanda Howells, MD   7 mL at 12/10/13 0800  . aspirin EC tablet 325 mg  325 mg Oral Daily Etta Quill, DO   325 mg at 12/10/13 1108  . atorvastatin (LIPITOR) tablet 40 mg  40 mg Oral q1800 Cletus Gash, MD      . clopidogrel (PLAVIX) tablet 75 mg  75 mg Oral Q breakfast Etta Quill, DO   75 mg at  12/10/13 1113  . dextrose (GLUTOSE) 40 % oral gel 37.5 g  1 Tube Oral PRN Etta Quill, DO      . dextrose 50 % solution 50 mL  50 mL Intravenous Once PRN Etta Quill, DO      . glucose-Vitamin C 4-0.006 GM per chewable tablet 4 tablet  4 tablet Oral PRN Etta Quill, DO      . heparin ADULT infusion 100 units/mL (25000 units/250 mL)  600 Units/hr Intravenous Continuous Georgina Peer, [facility] 7 mL/hr at 12/10/13 0350 700 Units/hr at 12/10/13 0350  . insulin aspart (novoLOG) injection 0-9 Units  0-9 Units Subcutaneous 6 times per day Etta Quill, DO      . insulin detemir (LEVEMIR) injection 14 Units  14 Units Subcutaneous Q2200 Etta Quill, DO      . labetalol (NORMODYNE,TRANDATE) injection 10-20 mg  10-20 mg Intravenous Q2H PRN Etta Quill, DO   10 mg at 12/10/13 0616  . metoprolol (LOPRESSOR) tablet 50 mg  50 mg Oral BID Lorretta Harp, MD   50 mg at 12/10/13 1108  . multivitamin (RENA-VIT) tablet 1 tablet  1 tablet Oral QHS Etta Quill, DO      . nitroGLYCERIN 50 mg in dextrose 5 % 250 mL (0.2 mg/mL) infusion  2-200 mcg/min Intravenous Titrated Cletus Gash, MD 4.5 mL/hr at 12/10/13 0800 15 mcg/min at 12/10/13 0800  . ondansetron (ZOFRAN) tablet 4 mg  4 mg Oral Q8H PRN Etta Quill, DO      . oxyCODONE (Oxy IR/ROXICODONE) immediate release tablet 5 mg  5 mg Oral TID Etta Quill, DO   5 mg at 12/10/13 1115  . sodium chloride 0.9 % injection 3 mL  3 mL Intravenous Q12H Etta Quill, DO      . sodium chloride 0.9 % injection 3 mL  3 mL Intravenous Q12H Etta Quill, DO      . sodium chloride 0.9 % injection 3 mL  3 mL Intravenous PRN Etta Quill, DO       Labs: Basic Metabolic Panel:  Recent Labs Lab 12/03/13 2201 12/10/13 0607  NA 135* 139  K 3.9 4.4  CL 90* 94*  CO2 25 23  GLUCOSE 228* 112*  BUN 11 56*  CREATININE 3.57* 6.98*  CALCIUM 9.3 8.7   Liver Function Tests:  Recent Labs Lab 12/03/13 2201  AST 13  ALT 9  ALKPHOS 125*   BILITOT 0.3  PROT 8.5*  ALBUMIN 3.5   No results found for this basename: LIPASE, AMYLASE,  in  the last 168 hours No results found for this basename: AMMONIA,  in the last 168 hours CBC:  Recent Labs Lab 12/03/13 2201 12/10/13 0607  WBC 15.3* 14.0*  NEUTROABS 12.9*  --   HGB 11.6* 8.5*  HCT 34.2* 24.8*  MCV 84.2 83.2  PLT 418* 453*   Cardiac Enzymes:  Recent Labs Lab 12/10/13 0607 12/10/13 1020  TROPONINI <0.30 <0.30   CBG:  Recent Labs Lab 12/10/13 0501 12/10/13 0740  GLUCAP 107* 110*   Iron Studies: No results found for this basename: IRON, TIBC, TRANSFERRIN, FERRITIN,  in the last 72 hours Studies/Results: No results found.   Review of Systems: Pt drowsy and confused. Denies chest pain, sob and weakness/fatigue. No complaints  Physical Exam: Filed Vitals:   12/10/13 0644 12/10/13 0700 12/10/13 0800 12/10/13 1108  BP: 172/59 162/56 168/55 143/56  Pulse: 82 88 86 94  Temp:  98.4 F (36.9 C)    TempSrc:  Oral    Resp: 18  16   Height:      Weight:      SpO2: 100% 100% 100%      General: Drowsy, arousable, well nourished, in no acute distress. Head: Normocephalic, atraumatic, sclera non-icteric, mucus membranes are moist Neck: Supple. JVD not elevated. No carotid bruits  Lungs: Clear bilaterally to auscultation without wheezes, rales, or rhonchi. Breathing is unlabored. Heart: RRR with S1 S2. No murmurs, rubs, or gallops appreciated. Abdomen: Soft, non-tender, non-distended with normoactive bowel sounds. No rebound/guarding. No obvious abdominal masses. M-S:  Strength and tone appear normal for age. Lower extremities:without edema or ischemic changes, no open wounds  Neuro: Alert and oriented to person and place only. Moves all extremities spontaneously. Psych:  Responds to questions appropriately with a normal affect. Dialysis Access: L AVG +b/t  Dialysis Orders:  TTS @[facility]  3hr 45 min  52.5kgs 2K/2Ca  6000Heparin 160   350/1.5 L AVG calcitriol  0.25 mcg  Aranesp 25 q 2 weeks (last 9.1) Venofer 50 q wk   Profile 2   Assessment/Plan: 1.  chest pain/NSTEMI- elevated troponin. Cardiology following. For cath lab today. Heparin and nitro drip.  2.  ESRD -  TTS [place]. HD post cath today. K+ 4.4 3.  Hypertension/volume  -171/133 left under edw, watch during HD, may need edw lowered if tolerats. Metop. Home norvasc. Only 1.3 L off Saturday.  4.  Anemia  - hgb 8.5- baseline 10-11. Start ESA today at increased dose. Cont weekly Fe.  5.  Metabolic bone disease -  Ca+ 8.7. Cont calcitriol and phoslo when diet advanced. Last phos 5.9 and PTH 345  6.  Nutrition - NPO for cath. Renal vit.  7. DM- per primary   Shelle Iron, NP [facility] [phone] 12/10/2013, 12:55 PM       I have seen and examined this patient and agree with plan as outlined by B. Orvil Feil, NP.  Pt currently not oriented to person or place and doesn't c/o any chest pain at this time.  Plan for cardiac cath and then will had HD afterwards. CUrrently stable. Bodee Lafoe A,MD 12/10/2013 1:57 PM

## 2013-12-10 NOTE — Progress Notes (Signed)
ANTICOAGULATION CONSULT NOTE - Follow Up Consult  Pharmacy Consult for Heparin Indication: CAD (s/p cath)  Allergies  Allergen Reactions  . Phenergan [Promethazine Hcl]     unknown    Patient Measurements: Height: 5\' 2"  (157.5 cm) Weight: 116 lb 6.5 oz (52.8 kg) IBW/kg (Calculated) : 50.1 Heparin Dosing Weight: 52.8 kg  Vital Signs: Temp: 98 F (36.7 C) (09/15 1255) Temp src: Oral (09/15 1255) BP: 182/45 mmHg (09/15 2000) Pulse Rate: 73 (09/15 2000)  Labs:  Recent Labs  12/10/13 0607 12/10/13 1020 12/10/13 1700  HGB 8.5*  --  7.4*  HCT 24.8*  --  22.0*  PLT 453*  --  417*  LABPROT  --   --  14.6  INR  --   --  1.14  HEPARINUNFRC  --  0.81*  --   CREATININE 6.98*  --   --   TROPONINI <0.30 <0.30 <0.30    Estimated Creatinine Clearance: 6.6 ml/min (by C-G formula based on Cr of 6.98).   Medications:  Scheduled:  . antiseptic oral rinse  7 mL Mouth Rinse BID  . aspirin EC  81 mg Oral Daily  . atorvastatin  40 mg Oral q1800  . calcitRIOL  0.25 mcg Oral Q T,Th,Sa-HD  . clopidogrel  75 mg Oral Q breakfast  . [START ON 12/16/2013] darbepoetin (ARANESP) injection - DIALYSIS  100 mcg Intravenous Q Mon-HD  . ferric gluconate (FERRLECIT/NULECIT) IV  62.5 mg Intravenous Weekly  . insulin aspart  0-9 Units Subcutaneous 6 times per day  . insulin detemir  14 Units Subcutaneous Q2200  . metoprolol tartrate  50 mg Oral BID  . multivitamin  1 tablet Oral QHS  . oxyCODONE  5 mg Oral TID  . sodium chloride  3 mL Intravenous Q12H  . sodium chloride  3 mL Intravenous Q12H   Infusions:  . sodium chloride    . nitroGLYCERIN 15 mcg/min (12/10/13 0800)    Assessment: 63 yo F s/p cardiac cath showing two vessel CAD.  Pt is to restart on heparin 8 hours after sheath pull pending decision for PCI.  Sheath pull 1810.  To restart heparin at 0210 on 9/16.  Currently pt is without IV access but awaiting IV team placement.  Previous heparin level of 0.81 on 700 units/hr.  Goal of  Therapy:  Heparin level 0.3-0.7 units/ml Monitor platelets by anticoagulation protocol: Yes   Plan:  Begin heparin at 600 units/hr (at [facility] 9/16). Adjust AM heparin level time to 1000, then daily. CBC daily.  [employer], Pharm.D., BCPS Clinical Pharmacist Pager [phone] 12/10/2013 9:50 PM

## 2013-12-10 NOTE — Progress Notes (Signed)
No iv access, awaiting IV team

## 2013-12-10 NOTE — Progress Notes (Signed)
CARE MANAGEMENT NOTE 12/10/2013  Patient:  KENLYNN, HOUDE   Account Number:  [id number]  Date Initiated:  12/10/2013  Documentation initiated by:  DAVIS,RHONDA  Subjective/Objective Assessment:   pt transferred from [facility] with nstemi and hx of renal failure requiring hd/     Action/Plan:   home when stable   Anticipated DC Date:  12/13/2013   Anticipated DC Plan:  HOME/SELF CARE  In-house referral  NA      DC Planning Services  CM consult      PAC Choice  NA   Choice offered to / List presented to:  NA      DME agency  NA     [place] arranged  NA      [place] agency  NA   Status of service:  In process, will continue to follow Medicare Important Message given?  NA - LOS <3 / Initial given by admissions (If response is "NO", the following Medicare IM given date fields will be blank) Date Medicare IM given:   Medicare IM given by:   Date Additional Medicare IM given:   Additional Medicare IM given by:    Discharge Disposition:    Per UR Regulation:  Reviewed for med. necessity/level of care/duration of stay  If discussed at [place] of Stay Meetings, dates discussed:    Comments:  [id number]/Rhonda Davis,RN,BSN,CCM

## 2013-12-10 NOTE — Consult Note (Signed)
CARDIOLOGY CONSULT NOTE  Patient ID: [name], MRN: [id number], DOB/AGE: [id number] 62 y.o. Admit date: 12/10/2013 Date of Consult: 12/10/2013  Primary Physician: Marco Collie, MD Primary Cardiologist: unassigned  Chief Complaint: chest pain Reason for Consultation:  Concern for NSTEMI  HPI: 62 y.o. female w/ PMHx significant for ESRD on dialysis, HTN, DM2, PVD with ulcerated R toe  who presented initially to [facility] with chest pain and transferred  [facility] on 12/10/2013 after labs returned concerning for myocardial damage.  Patient reports earlier this week feeling poorly and was seen in the ED at [facility] on 9/8 with nausea, vomiting and weakness. No troponins or EKG done. Today while at home resting, sudden onset of substernal chest pain that radiated to her neck and abdomen. Never had symptoms like this before. Resolved when oxygen was administered by EMS.  At [facility], her BP was in the 190s to 200s and labetolol was administered. Aspirin was given. Heparin gtt and nitro gtt started after consultation to transfer.  Currently, she is resting comfortably. No chest pain currently.  Outside cxray: benign  EKG: sinus, with flattening of the lateral TWaves.  Labs: Hct 28 Plt 451 WBC 14 Na 140 K 4.3 BUN 53 Cr 7.4  Trop 0.5  Echo from 05/2012: Nl LVEF, no sig valvular dz   Past Medical History  Diagnosis Date  . Diabetes mellitus   . Hypertension   . CHF (congestive heart failure)   . Hyperlipidemia   . End-stage renal disease on hemodialysis     Started dialysis in late 2014. Gets HD in [place] on TTS schedule.     . Thyroid disease   . Depression   . Shortness of breath     at night  . Arthritis   . Dementia   . Stroke     2013, weakness- all over  . Constipation   . Vision disturbance   . End stage renal disease 03/30/2011      Surgical History:  Past Surgical History  Procedure Laterality Date  . Arteriovenous graft placement      left upper  extremity  . Tubal ligation      x2  . Av fistula placement Left 07/02/2012    Procedure: ARTERIOVENOUS (AV) FISTULA CREATION;  Surgeon: Conrad Hordville, MD;  Location: [place];  Service: Vascular;  Laterality: Left;  . Av fistula placement Left 10/01/2012    Procedure: INSERTION OF ARTERIOVENOUS (AV) GORE-TEX GRAFT LEFT UPPER ARM;  Surgeon: Conrad Concord, MD;  Location: [place];  Service: Vascular;  Laterality: Left;  Ultrasound guided     Home Meds: Prior to Admission medications   Medication Sig Start Date End Date Taking? Authorizing Provider  amLODipine (NORVASC) 10 MG tablet Take 1 tablet (10 mg total) by mouth daily. 06/07/13   Kelvin Cellar, MD  clopidogrel (PLAVIX) 75 MG tablet Take 1 tablet (75 mg total) by mouth daily with breakfast. 09/19/13   Elam Dutch, MD  Insulin Detemir (LEVEMIR FLEXPEN) 100 UNIT/ML Pen Inject 14 Units into the skin daily at 10 pm. 05/27/13   Velta Addison Mikhail, DO  multivitamin (RENA-VIT) TABS tablet Take 1 tablet by mouth at bedtime. 05/24/13   Maryann Mikhail, DO  oxycodone (OXY-IR) 5 MG capsule Take 1 capsule (5 mg total) by mouth 3 (three) times daily. 11/04/13   Serafina Mitchell, MD  simvastatin (ZOCOR) 20 MG tablet Take 1 tablet (20 mg total) by mouth daily at 6 PM. 06/05/13   Debbe Odea, MD  ZOFRAN 4 MG tablet Take 4 mg by mouth every 8 (eight) hours as needed for nausea or vomiting.  06/25/13   Historical Provider, MD    Inpatient Medications:  . amLODipine  10 mg Oral Daily  . aspirin EC  325 mg Oral Daily  . clopidogrel  75 mg Oral Q breakfast  . heparin      . insulin aspart  0-9 Units Subcutaneous 6 times per day  . insulin detemir  14 Units Subcutaneous Q2200  . multivitamin  1 tablet Oral QHS  . nitroGLYCERIN      . oxyCODONE  5 mg Oral TID  . simvastatin  20 mg Oral q1800  . sodium chloride  3 mL Intravenous Q12H  . sodium chloride  3 mL Intravenous Q12H      Allergies:  Allergies  Allergen Reactions  . Phenergan [Promethazine Hcl]      unknown    History   Social History  . Marital Status: Divorced    Spouse Name: N/A    Number of Children: N/A  . Years of Education: N/A   Occupational History  . Not on file.   Social History Main Topics  . Smoking status: Never Smoker   . Smokeless tobacco: Never Used  . Alcohol Use: No  . Drug Use: No  . Sexual Activity: Not on file   Other Topics Concern  . Not on file   Social History Narrative  . No narrative on file     Family History  Problem Relation Age of Onset  . Diabetes Father   . Stroke Father   . Heart disease Mother      Review of Systems: General: negative for chills, fever, night sweats or weight changes.  Cardiovascular: see HPI Dermatological: toe ulceration Respiratory: negative for cough or wheezing Urologic: negative for hematuria Abdominal: negative for nausea, vomiting, diarrhea, bright red blood per rectum, melena, or hematemesis Neurologic: negative for visual changes, syncope, or dizziness All other systems reviewed and are otherwise negative except as noted above.  Labs: No results found for this basename: CKTOTAL, CKMB, TROPONINI,  in the last 72 hours Lab Results  Component Value Date   WBC 15.3* 12/03/2013   HGB 11.6* 12/03/2013   HCT 34.2* 12/03/2013   MCV 84.2 12/03/2013   PLT 418* 12/03/2013    Recent Labs Lab 12/03/13 2201  NA 135*  K 3.9  CL 90*  CO2 25  BUN 11  CREATININE 3.57*  CALCIUM 9.3  PROT 8.5*  BILITOT 0.3  ALKPHOS 125*  ALT 9  AST 13  GLUCOSE 228*   Radiology/Studies:  Dg Chest 2 View   Physical Exam: Blood pressure 164/60, pulse 92, temperature 99 F (37.2 C), temperature source Oral, resp. rate 17, height 5\' 2"  (1.575 m), weight 52.8 kg (116 lb 6.5 oz), SpO2 100.00%. General: in no acute distress. Head: Normocephalic, atraumatic, sclera non-icteric, no xanthomas, nares are without discharge.  Neck:  JVD not elevated. Lungs: Clear bilaterally to auscultation without wheezes, rales, or rhonchi.  Breathing is unlabored. Heart: RRR with S1 S2. 1/6 SEM Abdomen: Soft, non-tender, non-distended with normoactive bowel sounds. No hepatomegaly. No rebound/guarding. No obvious abdominal masses. Msk:  Strength and tone appear normal for age. Extremities: No clubbing or cyanosis. No edema.  Ext warm. R big toe with dry ulceration. fistual in left arm Neuro: Alert and oriented X 3. Moves all extremities spontaneously. Psych:  Responds to questions appropriately with a normal affect. Questionable recall   Assessment  and Plan:  1. Chest pain + troponin --> NSTEMI 2. ESRD, on TThS dialysis 3. PVD with R tibial disease, R toe ulceration 4. HTN, poorly controlled 5. Hyperlipidemia 6. H/o CVA, chart history of vascular dementia. 7. Anemia 8. Elevated WBC, afebrile  63 y.o. female w/ PMHx significant for ESRD on dialysis, HTN, DM2, PVD with ulcerated R toe  who presented initially to [facility] with chest pain and transferred  [facility] on 12/10/2013 with NSTEMI.  Multiple risk factors for CAD including DM2, HTN, PVD, and ESRD. Already on aspirin, plavix and a statin (would increase to atorvastatin 40). Would recommend addition of metoprolol bid to her BP regimen. IV nitro is reasonable in the short term prior to catheterization for goal BP < 140. Continue heparin, serial enzymes.  Noted her PVD and dialysis access which is important for LHC access. Will also need to coordinate with dialysis post-cath.  Anemia noted, goal Hct of > 28.   Recs: -metoprolol 25 bid -atorvastatin 40 qhs -continue aspirin, plavix, heparin gtt -nitro gtt for BP goal < 140 -npo for likely catheterization today  Thank you for assistance with this patient. Please call with questions.  Signed, Elias Else, Mila Homer MD 12/10/2013, 4:37 AM

## 2013-12-10 NOTE — Evaluation (Signed)
Received call about pt with chest pain and hypertensive emergency from [facility]. Baseline hx/o ESRD on HD TTS, CVA, type 2 DM, uremic encephalopathy. Chest pain x1 day. Noted trop 0.46. No significant ST/T wave changes per EDP. Also with systolic BP in 157W/62M. Was given labetolol in ER. BP now in 170s-180s. CP free s/p O2 per EDP. Asked EDP to discuss case with cards Elias Else). Will consult. Rec- start heparin gtt. Asked to start pt on nitro gtt. Accept to stepdown bed.   Hgb 9.2 Cr 7.4 -both stable

## 2013-12-10 NOTE — Progress Notes (Signed)
ANTICOAGULATION CONSULT NOTE - Initial Consult  Pharmacy Consult for heparin Indication: NSTEMI  Allergies  Allergen Reactions  . Phenergan [Promethazine Hcl]     unknown    Patient Measurements: Height: 5\' 2"  (157.5 cm) Weight: 116 lb 6.5 oz (52.8 kg) IBW/kg (Calculated) : 50.1  Vital Signs: Temp: 98.4 F (36.9 C) (09/15 0700) Temp src: Oral (09/15 0700) BP: 143/56 mmHg (09/15 1108) Pulse Rate: 94 (09/15 1108)   Medical History: Past Medical History  Diagnosis Date  . Diabetes mellitus   . Hypertension   . CHF (congestive heart failure)   . Hyperlipidemia   . End-stage renal disease on hemodialysis     Started dialysis in late 2014. Gets HD in [place] on TTS schedule.     . Thyroid disease   . Depression   . Shortness of breath     at night  . Arthritis   . Dementia   . Stroke     2013, weakness- all over  . Constipation   . Vision disturbance   . End stage renal disease 03/30/2011  . Coronary artery disease     Medications:  Prescriptions prior to admission  Medication Sig Dispense Refill  . amLODipine (NORVASC) 10 MG tablet Take 1 tablet (10 mg total) by mouth daily.  30 tablet  1  . clopidogrel (PLAVIX) 75 MG tablet Take 1 tablet (75 mg total) by mouth daily with breakfast.  30 tablet  6  . Insulin Detemir (LEVEMIR FLEXPEN) 100 UNIT/ML Pen Inject 14 Units into the skin daily at 10 pm.  15 mL  11  . multivitamin (RENA-VIT) TABS tablet Take 1 tablet by mouth at bedtime.  30 tablet  0  . oxycodone (OXY-IR) 5 MG capsule Take 1 capsule (5 mg total) by mouth 3 (three) times daily.  30 capsule  0  . simvastatin (ZOCOR) 20 MG tablet Take 1 tablet (20 mg total) by mouth daily at 6 PM.  30 tablet  0  . ZOFRAN 4 MG tablet Take 4 mg by mouth every 8 (eight) hours as needed for nausea or vomiting.        Scheduled:  . antiseptic oral rinse  7 mL Mouth Rinse BID  . aspirin EC  325 mg Oral Daily  . atorvastatin  40 mg Oral q1800  . clopidogrel  75 mg Oral Q breakfast   . insulin aspart  0-9 Units Subcutaneous 6 times per day  . insulin detemir  14 Units Subcutaneous Q2200  . metoprolol tartrate  50 mg Oral BID  . multivitamin  1 tablet Oral QHS  . oxyCODONE  5 mg Oral TID  . sodium chloride  3 mL Intravenous Q12H  . sodium chloride  3 mL Intravenous Q12H   Infusions:  . heparin 700 Units/hr (12/10/13 0350)  . nitroGLYCERIN 15 mcg/min (12/10/13 0800)    Assessment: 63yo female was seen 9/8 for N/V/weakness with recent discharge, now c/o sudden onset of substernal CP that radiated to neck/abdomen, resolved w/ O2 administered by EMS, troponin at OSH elevated now negative. Plan is for cath today pending decision by family members.   Patient continues on heparin with supratherapeutic level (0.81) patient is a very difficult stick with poor access, level drawn same side as heparin infusion after ~44minute pause in therapy. Heparin level could be slightly elevated d/t placement but given low body wt and current rate resultant level appears to correlate well. Will reduce rate slightly under the assumption of cath later today. If cath  is cancelled and heparin continued may need to consider picc line for labs.  No bleeding complications have been noted by nursing, hgb 8.5 will follow trend, pltc normal.  Goal of Therapy:  Heparin level 0.3-0.7 units/ml Monitor platelets by anticoagulation protocol: Yes   Plan:  Reduce heparin to 600 units/hr Follow up plan cath vs medical management  Erin Hearing PharmD., BCPS Clinical Pharmacist Pager [phone] 12/10/2013 12:14 PM

## 2013-12-10 NOTE — Procedures (Signed)
Central Venous Catheter Insertion Procedure Note [name] [id number] 10-Jun-1950  Procedure: Insertion of Central Venous Catheter Indications: Drug and/or fluid administration and Frequent blood sampling  Procedure Details Consent: Risks of procedure as well as the alternatives and risks of each were explained to the (patient/caregiver).  Consent for procedure obtained. Time Out: Verified patient identification, verified procedure, site/side was marked, verified correct patient position, special equipment/implants available, medications/allergies/relevent history reviewed, required imaging and test results available.  Performed  Maximum sterile technique was used including antiseptics, cap, gloves, gown, hand hygiene, mask and sheet. Skin prep: Chlorhexidine; local anesthetic administered A antimicrobial bonded/coated triple lumen catheter was placed in the left internal jugular vein using the Seldinger technique. Ultrasound guidance used.Yes.   Catheter placed to 18 cm. Blood aspirated via all 3 ports and then flushed x 3. Line sutured x 2 and dressing applied.  Evaluation Blood flow good Complications: No apparent complications Patient did tolerate procedure well. Chest X-ray ordered to verify placement.  CXR: pending  Georgann Housekeeper, ACNP [facility] Pulmonology/Critical Care Pager [phone] or [phone]  U/S used in placement.  I was present and supervised the entire procedure.  Rush Farmer, M.D. [facility] Pulmonary/Critical Care Medicine. Pager: [phone]. After hours pager: [phone].

## 2013-12-10 NOTE — Progress Notes (Signed)
NTG drip infusing at 17mcg/min on arrival to [facility]

## 2013-12-10 NOTE — Progress Notes (Signed)
INITIAL NUTRITION ASSESSMENT  Patient meets the criteria for moderate MALNUTRITION in the context of chronic illness with 10% weight loss in 6 months and moderate muscle and subcutaneous fat depletion.  DOCUMENTATION CODES Per approved criteria  -Non-severe (moderate) malnutrition in the context of chronic illness   INTERVENTION: -Advance diet as tolerated to Renal/Carbohydrate Modified, 1200 ml fluid restriction after cath.  -When diet advanced, will order Nepro shakes BID, each provides 425 kcal, 19 g protein  NUTRITION DIAGNOSIS: Inadequate oral intake related to inability to eat as evidenced by NPO status.   Goal: Patient will meet >/=90% of estimated nutrition needs  Monitor:  Diet advancement, PO intake, weight, labs, I/Os  Reason for Assessment: Malnutrition screening tool  62 y.o. female  Admitting Dx: Hypertensive emergency  ASSESSMENT: 63 year old female with ESRD on dialysis TTS, vascular dementia, diabetes, hypertension, presents with 1 day history of chest pain, found to be in hypertensive emergency with SBPs in the 200s. Concern for possible NSTEMI.   Patient is currently NPO for cath later today.   Patient reports that she typically eats 3 meals a day, and eats all of her meals. However, per chart review, she has lost about 9% of her usual weight in 6 months and 33% of her weight in the last year, which is significant over the time frame. History from patient is limited.   Nutrition Focused Physical Exam:  Subcutaneous Fat:  Orbital Region: mild depletion Upper Arm Region: mild depletion Thoracic and Lumbar Region: N/A  Muscle:  Temple Region: mild depletion Clavicle Bone Region: moderate depletion Clavicle and Acromion Bone Region: moderate depletion Scapular Bone Region: N/A Dorsal Hand: mild depletion Patellar Region: moderate depletion Anterior Thigh Region: moderate depletion Posterior Calf Region: moderate depletion  Edema: not  present  Height: Ht Readings from Last 1 Encounters:  12/10/13 5\' 2"  (1.575 m)    Weight: Wt Readings from Last 1 Encounters:  12/10/13 116 lb 6.5 oz (52.8 kg)    Ideal Body Weight: 110 pounds  % Ideal Body Weight: 105%  Wt Readings from Last 10 Encounters:  12/10/13 116 lb 6.5 oz (52.8 kg)  12/10/13 116 lb 6.5 oz (52.8 kg)  10/18/13 124 lb (56.246 kg)  10/18/13 124 lb (56.246 kg)  09/19/13 124 lb (56.246 kg)  06/13/13 124 lb (56.246 kg)  06/06/13 124 lb 12.5 oz (56.6 kg)  05/26/13 127 lb 3.3 oz (57.7 kg)  11/02/12 150 lb 8 oz (68.266 kg)  09/14/12 145 lb (65.772 kg)    Usual Body Weight: 127 pounds 05/2013, 150 pounds 10/2012  % Usual Body Weight: 91%, 77%  BMI:  Body mass index is 21.28 kg/(m^2). Patient is normal weight.   Estimated Nutritional Needs: Kcal: 1350-1450 kcal Protein: 75-85g Fluid: Per MD  Skin: Intact  Diet Order: NPO  EDUCATION NEEDS: -No education needs identified at this time   Intake/Output Summary (Last 24 hours) at 12/10/13 1340 Last data filed at 12/10/13 0800  Gross per 24 hour  Intake 278.42 ml  Output      0 ml  Net 278.42 ml    Last BM: PTA   Labs:   Recent Labs Lab 12/03/13 2201 12/10/13 0607  NA 135* 139  K 3.9 4.4  CL 90* 94*  CO2 25 23  BUN 11 56*  CREATININE 3.57* 6.98*  CALCIUM 9.3 8.7  GLUCOSE 228* 112*    CBG (last 3)   Recent Labs  12/10/13 0501 12/10/13 0740 12/10/13 1303  GLUCAP 107* 110* 121*  Scheduled Meds: . antiseptic oral rinse  7 mL Mouth Rinse BID  . aspirin EC  325 mg Oral Daily  . atorvastatin  40 mg Oral q1800  . calcitRIOL  0.25 mcg Oral Q T,Th,Sa-HD  . clopidogrel  75 mg Oral Q breakfast  . [START ON 12/16/2013] darbepoetin (ARANESP) injection - DIALYSIS  100 mcg Intravenous Q Mon-HD  . ferric gluconate (FERRLECIT/NULECIT) IV  62.5 mg Intravenous Weekly  . insulin aspart  0-9 Units Subcutaneous 6 times per day  . insulin detemir  14 Units Subcutaneous Q2200  . metoprolol  tartrate  50 mg Oral BID  . multivitamin  1 tablet Oral QHS  . oxyCODONE  5 mg Oral TID  . sodium chloride  3 mL Intravenous Q12H  . sodium chloride  3 mL Intravenous Q12H    Continuous Infusions: . heparin 700 Units/hr (12/10/13 0350)  . nitroGLYCERIN 15 mcg/min (12/10/13 0800)    Past Medical History  Diagnosis Date  . Diabetes mellitus   . Hypertension   . CHF (congestive heart failure)   . Hyperlipidemia   . End-stage renal disease on hemodialysis     Started dialysis in late 2014. Gets HD in [place] on TTS schedule.     . Thyroid disease   . Depression   . Shortness of breath     at night  . Arthritis   . Dementia   . Stroke     2013, weakness- all over  . Constipation   . Vision disturbance   . End stage renal disease 03/30/2011  . Coronary artery disease     Past Surgical History  Procedure Laterality Date  . Arteriovenous graft placement      left upper extremity  . Tubal ligation      x2  . Av fistula placement Left 07/02/2012    Procedure: ARTERIOVENOUS (AV) FISTULA CREATION;  Surgeon: Conrad Happy Camp, MD;  Location: [place];  Service: Vascular;  Laterality: Left;  . Av fistula placement Left 10/01/2012    Procedure: INSERTION OF ARTERIOVENOUS (AV) GORE-TEX GRAFT LEFT UPPER ARM;  Surgeon: Conrad , MD;  Location: [place];  Service: Vascular;  Laterality: Left;  Ultrasound guided    Larey Seat, RD, LDN Pager #: [phone] After-Hours Pager #: [phone]

## 2013-12-10 NOTE — Interval H&P Note (Signed)
Cath Lab Visit (complete for each Cath Lab visit)  Clinical Evaluation Leading to the Procedure:   ACS: No.  Non-ACS:    Anginal Classification: CCS III  Anti-ischemic medical therapy: Maximal Therapy (2 or more classes of medications)  Non-Invasive Test Results: No non-invasive testing performed  Prior CABG: No previous CABG      History and Physical Interval Note:  12/10/2013 4:56 PM  [name]  has presented today for surgery, with the diagnosis of cp  The various methods of treatment have been discussed with the patient and family. After consideration of risks, benefits and other options for treatment, the patient has consented to  Procedure(s): LEFT HEART CATHETERIZATION WITH CORONARY ANGIOGRAM (N/A) as a surgical intervention .  The patient's history has been reviewed, patient examined, no change in status, stable for surgery.  I have reviewed the patient's chart and labs.  Questions were answered to the patient's satisfaction.     Aeriel Boulay A

## 2013-12-10 NOTE — Progress Notes (Signed)
Site area: rt groin Site Prior to Removal:  Level 0 Pressure Applied For: 20 minutes Manual:   yes Patient Status During Pull:  stable Post Pull Site:  Level 0 Post Pull Instructions Given:  yes Post Pull Pulses Present: yes Dressing Applied:  yes Bedrest begins @ 8338 Comments: no complications

## 2013-12-10 NOTE — CV Procedure (Addendum)
[name] is a 63 y.o. female    [id number]  [id number] LOCATION:  FACILITY: [place]  PHYSICIAN: Troy Sine, MD, [facility] 10-30-1950   DATE OF PROCEDURE:  12/10/2013    CARDIAC CATHETERIZATION     HISTORY:    [name] is a 63 y.o. female who has end-stage renal disease on dialysis on Tuesdays, Thursdays, and Saturdays, who presented in transfer from [facility] after experiencing chest chest pain, and hypertensive emergency with systolic blood pressures in the 220s.  Initial troponin was 0.47.  Of note, she previously has had fairly stable hemoglobins in the 11 range.  Hemoglobin today was 8.5.  She is referred for definitive cardiac catheterization.   PROCEDURE: Left heart catheterization: Coronary angiography, left ventriculography  The patient was brought to the [facility] cardiac catherization labaratory in the postabsorptive state. She was premedicated initially with Versed 1 mg and fentanyl 25 mcg, but received an additional dosing during the procedure.  Her right groin was prepped and shaved in usual sterile fashion. Xylocaine 1% was used for local anesthesia. A 5 French sheath was inserted into the R femoral artery. Diagnostic catheterizatiion was done with 5 [place] FL4, FR4, No Torque right and pigtail catheters. Left ventriculography was done with 25cc Omnipaque contrast. Hemostasis was obtained by direct manual compression. The patient tolerated the procedure well.   HEMODYNAMICS:   Central Aorta: 112/40   Left Ventricle: 112/5  ANGIOGRAPHY:  Fluoroscopy revealed significant coronary calcification  Left main: Normal and bifurcated into an LAD and left circumflex coronary artery  LAD: There was a 50-60% ostial to proximal tubular smooth area of narrowing.  The vessel gave rise to 2 major diagonal branches and several septal septal perforating arteries.  In the mid LAD after the second diagonal vessel is diffuse area of 80, 90% stenosis.  More distally,  was another area of 80% diffuse stenosis.  The vessel extended to and wrapped around the LV apex.  Left circumflex: Large, codominant vessel that gave rise to 4 marginal branches and ended in the PDA  and PLA.-like vessel.  There was 80% stenosis in a small branch off the third marginal vessel..   Right coronary artery: 85% eccentric very proximal stenosis with evidence for 95% mid stenosis followed by 85% stenosis proximal to the acute margin.  The vessel supplied a small PDA-like vessel.  Left ventriculography revealed low normal LV function with an ejection fraction of 50-55% without significant focal segmental wall motion abnormality but with a  subtle area of mid distal inferior hypocontractility.   IMPRESSION:  Low normal global LV contractility with an ejection fraction of 50-55% with a small region of subtle mid-distal inferior hypocontractility.  Predominant two-vessel coronary artery disease with 50-60% tubular stenosis involving the LAD ostium and proximal segment, with 80-90% mid LAD stenoses, 80% mid-distal LAD stenosis; 80% stenosis in a small branch of the OM 3 vessel; and high-grade RCA stenoses with 85% eccentric proximal, 95%, mid, and 85% mid distal stenoses.  RECOMMENDATION:  Angiograms will be reviewed with colleagues.  The patient has calcified coronary arteries with predominant two-vessel CAD involving the LAD and RCA.  She is anemic with a hemoglobin that had dropped to 8.5 with hematocrit of 24.8.  Her "culprit" vessel is probably the RCA.  She will require dialysis.  Further investigation into her anemia may be necessary prior to consideration for percutaneous coronary intervention.    Troy Sine, MD, [facility] 12/10/2013 5:48 PM

## 2013-12-10 NOTE — Progress Notes (Signed)
IV site rt hand puffy. IVF stopped and IV Therapy paged to restart IV. Will recheck CBG on unit. Sipped on some regular sprite

## 2013-12-10 NOTE — Progress Notes (Signed)
[name]  [name] WUJ:[id number] DOB: [birth year] DOA: 12/10/2013 PCP: Marco Collie, MD   Brief narrative: 63 year old female patient with chronic kidney disease on dialysis (TTS), vascular dementia ,diabetes as well as hypertension. She presented to the emergency department at [facility] with a one-day history of chest pain. The pain was located in the central chest. This apparently was associated with a feeling of generalized malaise. Because of the symptoms the daughter had the patient not take her usual Norvasc dosage prior to arrival. The patient's daughter also postulated to the admitting physician that patient may have had too much fluid taken off with most recent dialysis treatment this past Saturday.  Upon evaluation at the [facility] emergency department patient was quite hypertensive with systolic blood pressure readings in the 220 range and an initial troponin level of 0.47. Patient's chest pain symptoms resolved after receiving oxygen and nitroglycerin. She was also given 20 mg of labetalol which dropped her systolic blood pressure to a more appropriate range in the 140-160 range. She was started on a heparin drip and continued on IV nitroglycerin and transferred to [facility].  After arrival to [facility] cone she was evaluated by the cardiology fellow who was concern for possible non-STEMI. By the time she arrived her chest pain had resolved. Apparently this patient has had at least 2 separate trips to the ER for similar symptoms in the past few weeks. Based on her symptoms cardiologist on a.m. rounds on 9/15 that it would be reasonable to pursue diagnostic cardiac catheterization since her symptoms are concerning for angina. Plans are to coordinate this procedure with patient's usual hemodialysis schedule. Repeat troponin after arrival to [facility] was less than 0.30.   HPI/Subjective: Currently denies chest pain or any other  symptoms.  Assessment/Plan:    Hypertensive emergency Could be the cause of the patient's chest pain-nonetheless has resolved at this time-current blood pressure controlled on IV nitroglycerin as well as Lopressor    NSTEMI  Cardiology following-symptoms concerning for angina so cardiac catheterization planned this admit-continue beta blocker, aspirin, and statin. Was on Plavix prior to admission    CKD  stage V requiring chronic dialysis Will notify nephrology of admission-usual dialysis days are TTS-does not appear volume overloaded at the present time    Leukocytosis/Abnormal urinalysis Patient unable to clarify if has residual voiding-urinalysis is abnormal and given leukocytosis for completeness we'll check a urine culture-since she is a dialysis patient and at risk for bacteremia we will check blood cultures-no clear cut source for infection so no indication to add antibiotics at this time    Type II diabetes mellitus on insulin Current CBGs well controlled-continue Levemir and sliding scale insulin    Vascular dementia    Peripheral vascular disease  DVT prophylaxis: Heparin infusion Code Status: Full Family Communication: No family at bedside Disposition Plan/Expected LOS: Step down  Consultants: Cardiology Nephrology  Procedures: Cardiac catheterization pending  Antibiotics: None  Objective: Blood pressure 143/56, pulse 94, temperature 98.4 F (36.9 C), temperature source Oral, resp. rate 16, height 5\' 2"  (1.575 m), weight 116 lb 6.5 oz (52.8 kg), SpO2 100.00%.  Intake/Output Summary (Last 24 hours) at 12/10/13 1126 Last data filed at 12/10/13 0800  Gross per 24 hour  Intake  38.42 ml  Output      0 ml  Net  38.42 ml   Exam: Followup exam completed-patient admitted at 4:21 AM today  Scheduled Meds:  Scheduled Meds: .  antiseptic oral rinse  7 mL Mouth Rinse BID  . aspirin EC  325 mg Oral Daily  . atorvastatin  40 mg Oral q1800  . clopidogrel  75 mg  Oral Q breakfast  . insulin aspart  0-9 Units Subcutaneous 6 times per day  . insulin detemir  14 Units Subcutaneous Q2200  . metoprolol tartrate  50 mg Oral BID  . multivitamin  1 tablet Oral QHS  . oxyCODONE  5 mg Oral TID  . sodium chloride  3 mL Intravenous Q12H  . sodium chloride  3 mL Intravenous Q12H   Continuous Infusions: . heparin 700 Units/hr (12/10/13 0350)  . nitroGLYCERIN 15 mcg/min (12/10/13 0800)    Data Reviewed: Basic Metabolic Panel:  Recent Labs Lab 12/03/13 2201 12/10/13 0607  NA 135* 139  K 3.9 4.4  CL 90* 94*  CO2 25 23  GLUCOSE 228* 112*  BUN 11 56*  CREATININE 3.57* 6.98*  CALCIUM 9.3 8.7   Liver Function Tests:  Recent Labs Lab 12/03/13 2201  AST 13  ALT 9  ALKPHOS 125*  BILITOT 0.3  PROT 8.5*  ALBUMIN 3.5   CBC:  Recent Labs Lab 12/03/13 2201 12/10/13 0607  WBC 15.3* 14.0*  NEUTROABS 12.9*  --   HGB 11.6* 8.5*  HCT 34.2* 24.8*  MCV 84.2 83.2  PLT 418* 453*   Cardiac Enzymes:  Recent Labs Lab 12/10/13 0607  TROPONINI <0.30   CBG:  Recent Labs Lab 12/10/13 0501 12/10/13 0740  GLUCAP 107* 110*    Recent Results (from the past 240 hour(s))  MRSA PCR SCREENING     Status: None   Collection Time    12/10/13  4:01 AM      Result Value Ref Range Status   MRSA by PCR NEGATIVE  NEGATIVE Final   Comment:            The GeneXpert MRSA Assay (FDA     approved for NASAL specimens     only), is one component of a     comprehensive MRSA colonization     surveillance program. It is not     intended to diagnose MRSA     infection nor to guide or     monitor treatment for     MRSA infections.     Studies:  Recent x-ray studies have been reviewed in detail by the Attending Physician  Time spent : 25+ mins      Erin Hearing, ANP [facility] Office  [phone] Pager [phone]   **If unable to reach the above provider after paging please contact the [place] @ [phone]  On-Call/Text Page:       [email or web address]      password [email or web address]  If 7PM-7AM, please contact night-coverage [email or web address] Password [email or web address] 12/10/2013, 11:26 AM   LOS: 0 days   I have personally examined this patient and reviewed the entire database. I have reviewed the above note, made any necessary editorial changes, and agree with its content.  Cherene Altes, MD [facility]

## 2013-12-10 NOTE — H&P (Addendum)
[facility] History and Physical  [name] CWC:[id number] DOB: April 24, 1950 DOA: 12/10/2013  Referring physician: EDP PCP: Marco Collie, MD   Chief Complaint: Chest pain   HPI: [name] is a 63 y.o. female with ESRD dialysis TTS who presents to the ED at [place] with c/o 1 day history of chest pain.  Pain is a tightness located in her central chest.  Wasn't feeling well earlier today and so daughter apparently had patient skip her norvasc at home.  Unclear if CP onset prior to or after skipping BP meds.  Per patient daughter was also suspicious that they "may have taken too much fluid off with dialysis on Sat".  Regardless of the order of events, patient presented to [facility] ED with CP.  In the ED at [place] she was found to be in hypertensive emergency with SBPs in the 220s, initial troponin of 0.47.  CP resolved after she was put on oxygen, NTG, and received 20mg  labetalol which dropped her BP down into the 140s-160s.  Heparin gtt, nitro gtt started and patient transferred to [facility].  Review of Systems: Systems reviewed.  As above, otherwise negative  Past Medical History  Diagnosis Date  . Diabetes mellitus   . Hypertension   . CHF (congestive heart failure)   . Hyperlipidemia   . End-stage renal disease on hemodialysis     Started dialysis in late 2014. Gets HD in [place] on TTS schedule.     . Thyroid disease   . Depression   . Shortness of breath     at night  . Arthritis   . Dementia   . Stroke     2013, weakness- all over  . Constipation   . Vision disturbance   . End stage renal disease 03/30/2011   Past Surgical History  Procedure Laterality Date  . Arteriovenous graft placement      left upper extremity  . Tubal ligation      x2  . Av fistula placement Left 07/02/2012    Procedure: ARTERIOVENOUS (AV) FISTULA CREATION;  Surgeon: Conrad [place], MD;  Location: [place];  Service: Vascular;  Laterality: Left;  . Av fistula placement Left 10/01/2012   Procedure: INSERTION OF ARTERIOVENOUS (AV) GORE-TEX GRAFT LEFT UPPER ARM;  Surgeon: Conrad Williamson, MD;  Location: [place];  Service: Vascular;  Laterality: Left;  Ultrasound guided   Social History:  reports that she has never smoked. She has never used smokeless tobacco. She reports that she does not drink alcohol or use illicit drugs.  Allergies  Allergen Reactions  . Phenergan [Promethazine Hcl]     unknown    Family History  Problem Relation Age of Onset  . Diabetes Father   . Stroke Father   . Heart disease Mother      Prior to Admission medications   Medication Sig Start Date End Date Taking? Authorizing Provider  amLODipine (NORVASC) 10 MG tablet Take 1 tablet (10 mg total) by mouth daily. 06/07/13   Kelvin Cellar, MD  clopidogrel (PLAVIX) 75 MG tablet Take 1 tablet (75 mg total) by mouth daily with breakfast. 09/19/13   Elam Dutch, MD  Insulin Detemir (LEVEMIR FLEXPEN) 100 UNIT/ML Pen Inject 14 Units into the skin daily at 10 pm. 05/27/13   Velta Addison Mikhail, DO  multivitamin (RENA-VIT) TABS tablet Take 1 tablet by mouth at bedtime. 05/24/13   Maryann Mikhail, DO  oxycodone (OXY-IR) 5 MG capsule Take 1 capsule (5 mg total) by mouth 3 (three) times  daily. 11/04/13   Serafina Mitchell, MD  simvastatin (ZOCOR) 20 MG tablet Take 1 tablet (20 mg total) by mouth daily at 6 PM. 06/05/13   Debbe Odea, MD  ZOFRAN 4 MG tablet Take 4 mg by mouth every 8 (eight) hours as needed for nausea or vomiting.  06/25/13   Historical Provider, MD   Physical Exam: Filed Vitals:   12/10/13 0400  BP: 196/68  Pulse: 95  Temp:   Resp: 16    BP 196/68  Pulse 95  Temp(Src) 99 F (37.2 C) (Oral)  Resp 16  Ht 5\' 2"  (1.575 m)  Wt 52.8 kg (116 lb 6.5 oz)  BMI 21.28 kg/m2  SpO2 100%  General Appearance:    Alert, mostly oriented but not so much to time, no distress, appears stated age  Head:    Normocephalic, atraumatic  Eyes:    PERRL, EOMI, sclera non-icteric        Nose:   Nares without  drainage or epistaxis. Mucosa, turbinates normal  Throat:   Moist mucous membranes. Oropharynx without erythema or exudate.  Neck:   Supple. No carotid bruits.  No thyromegaly.  No lymphadenopathy.   Back:     No CVA tenderness, no spinal tenderness  Lungs:     Clear to auscultation bilaterally, without wheezes, rhonchi or rales  Chest wall:    No tenderness to palpitation  Heart:    Regular rate and rhythm without murmurs, gallops, rubs  Abdomen:     Soft, non-tender, nondistended, normal bowel sounds, no organomegaly  Genitalia:    deferred  Rectal:    deferred  Extremities:   No clubbing, cyanosis or edema.  Pulses:   Absent DPs bilaterally.  Skin:   Necrotic appearing toe.  Lymph nodes:   Cervical, supraclavicular, and axillary nodes normal  Neurologic:   CNII-XII intact. Normal strength, sensation and reflexes      throughout    Labs on Admission:  Basic Metabolic Panel:  Recent Labs Lab 12/03/13 2201  NA 135*  K 3.9  CL 90*  CO2 25  GLUCOSE 228*  BUN 11  CREATININE 3.57*  CALCIUM 9.3   Liver Function Tests:  Recent Labs Lab 12/03/13 2201  AST 13  ALT 9  ALKPHOS 125*  BILITOT 0.3  PROT 8.5*  ALBUMIN 3.5   No results found for this basename: LIPASE, AMYLASE,  in the last 168 hours No results found for this basename: AMMONIA,  in the last 168 hours CBC:  Recent Labs Lab 12/03/13 2201  WBC 15.3*  NEUTROABS 12.9*  HGB 11.6*  HCT 34.2*  MCV 84.2  PLT 418*   Cardiac Enzymes: No results found for this basename: CKTOTAL, CKMB, CKMBINDEX, TROPONINI,  in the last 168 hours  BNP (last 3 results) No results found for this basename: PROBNP,  in the last 8760 hours CBG: No results found for this basename: GLUCAP,  in the last 168 hours  Radiological Exams on Admission: No results found.  EKG: Independently reviewed.  Assessment/Plan Principal Problem:   Hypertensive emergency Active Problems:   End stage renal disease   Type II or unspecified type  diabetes mellitus without mention of complication, not stated as uncontrolled   Diabetic nephropathy   Vascular dementia   Peripheral vascular disease, unspecified   NSTEMI (non-ST elevated myocardial infarction)   1. HTN emergency with NSTEMI 1. Cardiology on board 1. Per them likely true ACS at this point given the presentation and PAD history 2. NPO and  likely to go to cath lab 2. Serial trops ordered 1. No priors in our system but priors in [facility] system have always been normal apparently 3. Heparin gtt currently going 4. Nitro gtt going 5. PRN labetalol ordered as well as nitro gtt dosent seem to be doing quite as much for her BP (now back up to the 190s) and the labetalol clearly worked in the ED 6. Putting patient on metoprolol 25mg  BID, stopping amlodipine since no mortality benefit in ACS 7. Continue home statin 2. ESRD - message left on dialysis consult line for routine dialysis later today,  3. DM2 - continue home levemir, add low dose SSI AC/HS on renal carb modified diet. 4. Vascular dementia - suspect current mental status is close to baseline 5. PAD - severe occlusive disease in popliteals and tibial arteries bilaterally, the iliacs and aorta are patent as seen on Dr. Oneida Alar' July Aortogram.    Code Status: Full Code  Family Communication: No family in room Disposition Plan: Admit to inpatient   Time spent: 64 min  Aunisty Reali M. [facility] Pager [phone]  If 7AM-7PM, please contact the day team taking care of the patient [email or web address] Password [email or web address] 12/10/2013, 4:21 AM

## 2013-12-10 NOTE — Progress Notes (Signed)
Subjective:  No CP/SOB, admitted with [place] on IV hep/NTG  Objective:  Temp:  [98.4 F (36.9 C)-99 F (37.2 C)] 98.4 F (36.9 C) (09/15 0700) Pulse Rate:  [82-95] 86 (09/15 0800) Resp:  [13-19] 16 (09/15 0800) BP: (159-196)/(52-68) 168/55 mmHg (09/15 0800) SpO2:  [100 %] 100 % (09/15 0800) Weight:  [116 lb 6.5 oz (52.8 kg)] 116 lb 6.5 oz (52.8 kg) (09/15 0351) Weight change:   Intake/Output from previous day: 09/14 0701 - 09/15 0700 In: 31.4 [I.V.:31.4] Out: -   Intake/Output from this shift: Total I/O In: 7 [I.V.:7] Out: -   Physical Exam: General appearance: alert and no distress Neck: no adenopathy, no carotid bruit, no JVD, supple, symmetrical, trachea midline and thyroid not enlarged, symmetric, no tenderness/mass/nodules Lungs: clear to auscultation bilaterally Heart: Soft outflow murmur Extremities: Ischemic ulcer right great toe  Lab Results: Results for orders placed during the hospital encounter of 12/10/13 (from the past 48 hour(s))  MRSA PCR SCREENING     Status: None   Collection Time    12/10/13  4:01 AM      Result Value Ref Range   MRSA by PCR NEGATIVE  NEGATIVE   Comment:            The GeneXpert MRSA Assay (FDA     approved for NASAL specimens     only), is one component of a     comprehensive MRSA colonization     surveillance program. It is not     intended to diagnose MRSA     infection nor to guide or     monitor treatment for     MRSA infections.  GLUCOSE, CAPILLARY     Status: Abnormal   Collection Time    12/10/13  5:01 AM      Result Value Ref Range   Glucose-Capillary 107 (*) 70 - 99 mg/dL   Comment 1 Capillary Sample    CBC     Status: Abnormal   Collection Time    12/10/13  6:07 AM      Result Value Ref Range   WBC 14.0 (*) 4.0 - 10.5 K/uL   RBC 2.98 (*) 3.87 - 5.11 MIL/uL   Hemoglobin 8.5 (*) 12.0 - 15.0 g/dL   HCT 24.8 (*) 36.0 - 46.0 %   MCV 83.2  78.0 - 100.0 fL   MCH 28.5  26.0 - 34.0 pg   MCHC 34.3  30.0 - 36.0  g/dL   RDW 14.3  11.5 - 15.5 %   Platelets 453 (*) 150 - 400 K/uL  BASIC METABOLIC PANEL     Status: Abnormal   Collection Time    12/10/13  6:07 AM      Result Value Ref Range   Sodium 139  137 - 147 mEq/L   Potassium 4.4  3.7 - 5.3 mEq/L   Chloride 94 (*) 96 - 112 mEq/L   CO2 23  19 - 32 mEq/L   Glucose, Bld 112 (*) 70 - 99 mg/dL   BUN 56 (*) 6 - 23 mg/dL   Creatinine, Ser 6.98 (*) 0.50 - 1.10 mg/dL   Calcium 8.7  8.4 - 10.5 mg/dL   GFR calc non Af Amer 6 (*) >90 mL/min   GFR calc Af Amer 7 (*) >90 mL/min   Comment: (NOTE)     The eGFR has been calculated using the CKD EPI equation.     This calculation has not been validated in all clinical  situations.     eGFR's persistently <90 mL/min signify possible Chronic Kidney     Disease.   Anion gap 22 (*) 5 - 15  TROPONIN I     Status: None   Collection Time    12/10/13  6:07 AM      Result Value Ref Range   Troponin I <0.30  <0.30 ng/mL   Comment:            Due to the release kinetics of cTnI,     a negative result within the first hours     of the onset of symptoms does not rule out     myocardial infarction with certainty.     If myocardial infarction is still suspected,     repeat the test at appropriate intervals.  GLUCOSE, CAPILLARY     Status: Abnormal   Collection Time    12/10/13  7:40 AM      Result Value Ref Range   Glucose-Capillary 110 (*) 70 - 99 mg/dL   Comment 1 Capillary Sample      Imaging: Imaging results have been reviewed  Assessment/Plan:   1. Principal Problem: 2.   Hypertensive emergency 3. Active Problems: 4.   End stage renal disease 5.   Type II or unspecified type diabetes mellitus without mention of complication, not stated as uncontrolled 6.   Diabetic nephropathy 7.   Vascular dementia 8.   Peripheral vascular disease, unspecified 9.   NSTEMI (non-ST elevated myocardial infarction) 10.   Time Spent Directly with Patient:  20 minutes  Length of Stay:  LOS: 0 days   Pt with  + CRF on HD admitted in transfer from [facility]. Enz neg. EKG with NSSTTWC. Exam benign. Scheduled for HD this afternoon. She gives a good story for angina. Has gone to an ER twice in the last few weeks . Will need cor angio to determine etiology and anatomy.  BERRY,JONATHAN J 12/10/2013, 10:00 AM

## 2013-12-10 NOTE — H&P (View-Only) (Signed)
Subjective:  No CP/SOB, admitted with [place] on IV hep/NTG  Objective:  Temp:  [98.4 F (36.9 C)-99 F (37.2 C)] 98.4 F (36.9 C) (09/15 0700) Pulse Rate:  [82-95] 86 (09/15 0800) Resp:  [13-19] 16 (09/15 0800) BP: (159-196)/(52-68) 168/55 mmHg (09/15 0800) SpO2:  [100 %] 100 % (09/15 0800) Weight:  [116 lb 6.5 oz (52.8 kg)] 116 lb 6.5 oz (52.8 kg) (09/15 0351) Weight change:   Intake/Output from previous day: 09/14 0701 - 09/15 0700 In: 31.4 [I.V.:31.4] Out: -   Intake/Output from this shift: Total I/O In: 7 [I.V.:7] Out: -   Physical Exam: General appearance: alert and no distress Neck: no adenopathy, no carotid bruit, no JVD, supple, symmetrical, trachea midline and thyroid not enlarged, symmetric, no tenderness/mass/nodules Lungs: clear to auscultation bilaterally Heart: Soft outflow murmur Extremities: Ischemic ulcer right great toe  Lab Results: Results for orders placed during the hospital encounter of 12/10/13 (from the past 48 hour(s))  MRSA PCR SCREENING     Status: None   Collection Time    12/10/13  4:01 AM      Result Value Ref Range   MRSA by PCR NEGATIVE  NEGATIVE   Comment:            The GeneXpert MRSA Assay (FDA     approved for NASAL specimens     only), is one component of a     comprehensive MRSA colonization     surveillance program. It is not     intended to diagnose MRSA     infection nor to guide or     monitor treatment for     MRSA infections.  GLUCOSE, CAPILLARY     Status: Abnormal   Collection Time    12/10/13  5:01 AM      Result Value Ref Range   Glucose-Capillary 107 (*) 70 - 99 mg/dL   Comment 1 Capillary Sample    CBC     Status: Abnormal   Collection Time    12/10/13  6:07 AM      Result Value Ref Range   WBC 14.0 (*) 4.0 - 10.5 K/uL   RBC 2.98 (*) 3.87 - 5.11 MIL/uL   Hemoglobin 8.5 (*) 12.0 - 15.0 g/dL   HCT 24.8 (*) 36.0 - 46.0 %   MCV 83.2  78.0 - 100.0 fL   MCH 28.5  26.0 - 34.0 pg   MCHC 34.3  30.0 - 36.0  g/dL   RDW 14.3  11.5 - 15.5 %   Platelets 453 (*) 150 - 400 K/uL  BASIC METABOLIC PANEL     Status: Abnormal   Collection Time    12/10/13  6:07 AM      Result Value Ref Range   Sodium 139  137 - 147 mEq/L   Potassium 4.4  3.7 - 5.3 mEq/L   Chloride 94 (*) 96 - 112 mEq/L   CO2 23  19 - 32 mEq/L   Glucose, Bld 112 (*) 70 - 99 mg/dL   BUN 56 (*) 6 - 23 mg/dL   Creatinine, Ser 6.98 (*) 0.50 - 1.10 mg/dL   Calcium 8.7  8.4 - 10.5 mg/dL   GFR calc non Af Amer 6 (*) >90 mL/min   GFR calc Af Amer 7 (*) >90 mL/min   Comment: (NOTE)     The eGFR has been calculated using the CKD EPI equation.     This calculation has not been validated in all clinical  situations.     eGFR's persistently <90 mL/min signify possible Chronic Kidney     Disease.   Anion gap 22 (*) 5 - 15  TROPONIN I     Status: None   Collection Time    12/10/13  6:07 AM      Result Value Ref Range   Troponin I <0.30  <0.30 ng/mL   Comment:            Due to the release kinetics of cTnI,     a negative result within the first hours     of the onset of symptoms does not rule out     myocardial infarction with certainty.     If myocardial infarction is still suspected,     repeat the test at appropriate intervals.  GLUCOSE, CAPILLARY     Status: Abnormal   Collection Time    12/10/13  7:40 AM      Result Value Ref Range   Glucose-Capillary 110 (*) 70 - 99 mg/dL   Comment 1 Capillary Sample      Imaging: Imaging results have been reviewed  Assessment/Plan:   1. Principal Problem: 2.   Hypertensive emergency 3. Active Problems: 4.   End stage renal disease 5.   Type II or unspecified type diabetes mellitus without mention of complication, not stated as uncontrolled 6.   Diabetic nephropathy 7.   Vascular dementia 8.   Peripheral vascular disease, unspecified 9.   NSTEMI (non-ST elevated myocardial infarction) 10.   Time Spent Directly with Patient:  20 minutes  Length of Stay:  LOS: 0 days   Pt with  + CRF on HD admitted in transfer from [facility]. Enz neg. EKG with NSSTTWC. Exam benign. Scheduled for HD this afternoon. She gives a good story for angina. Has gone to an ER twice in the last few weeks . Will need cor angio to determine etiology and anatomy.  [name] 12/10/2013, 10:00 AM

## 2013-12-10 NOTE — Progress Notes (Signed)
ANTICOAGULATION CONSULT NOTE - Initial Consult  Pharmacy Consult for heparin Indication: NSTEMI  Allergies  Allergen Reactions  . Phenergan [Promethazine Hcl]     unknown    Patient Measurements: Height: 5\' 2"  (157.5 cm) Weight: 116 lb 6.5 oz (52.8 kg) IBW/kg (Calculated) : 50.1  Vital Signs: Temp: 99 F (37.2 C) (09/15 0351) Temp src: Oral (09/15 0351) BP: 159/52 mmHg (09/15 0515) Pulse Rate: 82 (09/15 0515)   Medical History: Past Medical History  Diagnosis Date  . Diabetes mellitus   . Hypertension   . CHF (congestive heart failure)   . Hyperlipidemia   . End-stage renal disease on hemodialysis     Started dialysis in late 2014. Gets HD in [place] on TTS schedule.     . Thyroid disease   . Depression   . Shortness of breath     at night  . Arthritis   . Dementia   . Stroke     2013, weakness- all over  . Constipation   . Vision disturbance   . End stage renal disease 03/30/2011    Medications:  Prescriptions prior to admission  Medication Sig Dispense Refill  . amLODipine (NORVASC) 10 MG tablet Take 1 tablet (10 mg total) by mouth daily.  30 tablet  1  . clopidogrel (PLAVIX) 75 MG tablet Take 1 tablet (75 mg total) by mouth daily with breakfast.  30 tablet  6  . Insulin Detemir (LEVEMIR FLEXPEN) 100 UNIT/ML Pen Inject 14 Units into the skin daily at 10 pm.  15 mL  11  . multivitamin (RENA-VIT) TABS tablet Take 1 tablet by mouth at bedtime.  30 tablet  0  . oxycodone (OXY-IR) 5 MG capsule Take 1 capsule (5 mg total) by mouth 3 (three) times daily.  30 capsule  0  . simvastatin (ZOCOR) 20 MG tablet Take 1 tablet (20 mg total) by mouth daily at 6 PM.  30 tablet  0  . ZOFRAN 4 MG tablet Take 4 mg by mouth every 8 (eight) hours as needed for nausea or vomiting.        Scheduled:  . antiseptic oral rinse  7 mL Mouth Rinse BID  . aspirin EC  325 mg Oral Daily  . atorvastatin  40 mg Oral q1800  . clopidogrel  75 mg Oral Q breakfast  . heparin      . insulin  aspart  0-9 Units Subcutaneous 6 times per day  . insulin detemir  14 Units Subcutaneous Q2200  . metoprolol tartrate  25 mg Oral BID  . multivitamin  1 tablet Oral QHS  . oxyCODONE  5 mg Oral TID  . sodium chloride  3 mL Intravenous Q12H  . sodium chloride  3 mL Intravenous Q12H   Infusions:  . heparin    . nitroGLYCERIN 15 mcg/min (12/10/13 0410)    Assessment: 63yo female was seen 9/8 for N/V/weakness and discharged, now c/o sudden onset of substernal CP that radiated to neck/abdomen, resolved w/ O2 administered by EMS, troponin at OSH elevated, tx'd to [facility] for cardiac workup, to continue heparin.  Goal of Therapy:  Heparin level 0.3-0.7 units/ml Monitor platelets by anticoagulation protocol: Yes   Plan:  Rec'd heparin 2500 units x1 prior to transfer, gtt currently running at 700 units/hr; will continue for now and monitor heparin levels and CBC.  Wynona Neat, PharmD, BCPS  12/10/2013,5:29 AM

## 2013-12-11 DIAGNOSIS — R072 Precordial pain: Secondary | ICD-10-CM

## 2013-12-11 DIAGNOSIS — R079 Chest pain, unspecified: Secondary | ICD-10-CM | POA: Diagnosis present

## 2013-12-11 DIAGNOSIS — I739 Peripheral vascular disease, unspecified: Secondary | ICD-10-CM

## 2013-12-11 LAB — HEPARIN LEVEL (UNFRACTIONATED)
Heparin Unfractionated: <0.1 IU/mL — ABNORMAL LOW (ref 0.30–0.70)
Heparin Unfractionated: 0.11 IU/mL — ABNORMAL LOW (ref 0.30–0.70)
Heparin Unfractionated: 0.15 IU/mL — ABNORMAL LOW (ref 0.30–0.70)

## 2013-12-11 LAB — GLUCOSE, CAPILLARY
GLUCOSE-CAPILLARY: 58 mg/dL — AB (ref 70–99)
Glucose-Capillary: 106 mg/dL — ABNORMAL HIGH (ref 70–99)
Glucose-Capillary: 113 mg/dL — ABNORMAL HIGH (ref 70–99)
Glucose-Capillary: 154 mg/dL — ABNORMAL HIGH (ref 70–99)
Glucose-Capillary: 164 mg/dL — ABNORMAL HIGH (ref 70–99)
Glucose-Capillary: 164 mg/dL — ABNORMAL HIGH (ref 70–99)
Glucose-Capillary: 42 mg/dL — CL (ref 70–99)
Glucose-Capillary: 62 mg/dL — ABNORMAL LOW (ref 70–99)
Glucose-Capillary: 95 mg/dL (ref 70–99)

## 2013-12-11 LAB — BASIC METABOLIC PANEL
Anion gap: 18 — ABNORMAL HIGH (ref 5–15)
BUN: 65 mg/dL — ABNORMAL HIGH (ref 6–23)
CO2: 23 mEq/L (ref 19–32)
Calcium: 8.7 mg/dL (ref 8.4–10.5)
Chloride: 92 mEq/L — ABNORMAL LOW (ref 96–112)
Creatinine, Ser: 7.86 mg/dL — ABNORMAL HIGH (ref 0.50–1.10)
GFR, EST AFRICAN AMERICAN: 6 mL/min — AB (ref >90)
GFR, EST NON AFRICAN AMERICAN: 5 mL/min — AB (ref >90)
Glucose, Bld: 70 mg/dL (ref 70–99)
POTASSIUM: 4.9 meq/L (ref 3.7–5.3)
Sodium: 133 mEq/L — ABNORMAL LOW (ref 137–147)

## 2013-12-11 LAB — RETICULOCYTES
RBC.: 2.72 MIL/uL — ABNORMAL LOW (ref 3.87–5.11)
Retic Count, Absolute: 59.8 10*3/uL (ref 19.0–186.0)
Retic Ct Pct: 2.2 % (ref 0.4–3.1)

## 2013-12-11 LAB — IRON AND TIBC
Iron: 119 ug/dL (ref 42–135)
UIBC: <15 ug/dL — ABNORMAL LOW (ref 125–400)

## 2013-12-11 LAB — CBC
HCT: 21.1 % — ABNORMAL LOW (ref 36.0–46.0)
HEMOGLOBIN: 7.2 g/dL — AB (ref 12.0–15.0)
MCH: 28.1 pg (ref 26.0–34.0)
MCHC: 34.1 g/dL (ref 30.0–36.0)
MCV: 82.4 fL (ref 78.0–100.0)
Platelets: 395 10*3/uL (ref 150–400)
RBC: 2.56 MIL/uL — ABNORMAL LOW (ref 3.87–5.11)
RDW: 14.4 % (ref 11.5–15.5)
WBC: 9.3 10*3/uL (ref 4.0–10.5)

## 2013-12-11 LAB — FOLATE: Folate: 9.4 ng/mL

## 2013-12-11 LAB — PREPARE RBC (CROSSMATCH)

## 2013-12-11 LAB — FERRITIN: Ferritin: 988 ng/mL — ABNORMAL HIGH (ref 10–291)

## 2013-12-11 LAB — ABO/RH: ABO/RH(D): O POS

## 2013-12-11 LAB — VITAMIN B12: Vitamin B-12: 752 pg/mL (ref 211–911)

## 2013-12-11 LAB — TSH: TSH: 0.471 u[IU]/mL (ref 0.350–4.500)

## 2013-12-11 MED ORDER — SODIUM CHLORIDE 0.9 % IV SOLN
Freq: Once | INTRAVENOUS | Status: AC
  Administered 2013-12-11: 12:00:00 via INTRAVENOUS

## 2013-12-11 MED ORDER — ACETAMINOPHEN 325 MG PO TABS
ORAL_TABLET | ORAL | Status: AC
  Filled 2013-12-11: qty 2

## 2013-12-11 MED ORDER — DEXTROSE 50 % IV SOLN
INTRAVENOUS | Status: AC
  Administered 2013-12-11: 50 mL via INTRAVENOUS
  Filled 2013-12-11: qty 50

## 2013-12-11 MED ORDER — HEPARIN (PORCINE) IN NACL 100-0.45 UNIT/ML-% IJ SOLN
950.0000 [IU]/h | INTRAMUSCULAR | Status: DC
  Administered 2013-12-12: 850 [IU]/h via INTRAVENOUS
  Filled 2013-12-11 (×2): qty 250

## 2013-12-11 MED ORDER — DEXTROSE 50 % IV SOLN
INTRAVENOUS | Status: AC
Start: 2013-12-11 — End: 2013-12-11
  Administered 2013-12-11: 25 mL
  Filled 2013-12-11: qty 50

## 2013-12-11 MED ORDER — METOPROLOL TARTRATE 50 MG PO TABS
75.0000 mg | ORAL_TABLET | Freq: Two times a day (BID) | ORAL | Status: DC
  Administered 2013-12-11 – 2013-12-12 (×2): 75 mg via ORAL
  Filled 2013-12-11 (×3): qty 1

## 2013-12-11 MED ORDER — DEXTROSE 50 % IV SOLN
50.0000 mL | Freq: Once | INTRAVENOUS | Status: AC | PRN
  Administered 2013-12-11: 50 mL via INTRAVENOUS

## 2013-12-11 MED ORDER — AMLODIPINE BESYLATE 10 MG PO TABS
10.0000 mg | ORAL_TABLET | Freq: Every day | ORAL | Status: DC
  Administered 2013-12-11 – 2013-12-16 (×5): 10 mg via ORAL
  Filled 2013-12-11 (×9): qty 1

## 2013-12-11 NOTE — Progress Notes (Signed)
Hypoglycemic Event  CBG: 58  Treatment 59ml D50  Symptoms: none  Follow-up CBG: JOIT:[id number] CBG Result:113  Possible Reasons for Event: uknown  Comments/MD notified:yes    Amo Kuffour, Siddalee Vanderheiden  Remember to initiate Hypoglycemia Order Set & complete

## 2013-12-11 NOTE — Progress Notes (Signed)
CRITICAL VALUE ALERT  Critical value received:  Hgb=7.2  Date of notification:  12/11/2013  Time of notification: 0400  Critical value read back:yes  Nurse who received alert:  Purcell Nails  MD notified (1st page):  Chaney Malling  Time of first page:  0400  MD notified (2nd page):  Time of second page:  Responding MD:  Chaney Malling  Time MD responded:  [phone]

## 2013-12-11 NOTE — Progress Notes (Signed)
[name]  [name] YQM:[id number] DOB: 07/18/1950 DOA: 12/10/2013 PCP: Marco Collie, MD   Brief narrative: 63 year old female patient with chronic kidney disease on dialysis (TTS), vascular dementia ,diabetes as well as hypertension. She presented to the emergency department at [facility] with a one-day history of chest pain. The pain was located in the central chest. This apparently was associated with a feeling of generalized malaise. Because of the symptoms the daughter had the patient not take her usual Norvasc dosage prior to arrival. The patient's daughter also postulated to the admitting physician that patient may have had too much fluid taken off with most recent dialysis treatment this past Saturday.  Upon evaluation at the [facility] emergency department patient was quite hypertensive with systolic blood pressure readings in the 220 range and an initial troponin level of 0.47. Patient's chest pain symptoms resolved after receiving oxygen and nitroglycerin. She was also given 20 mg of labetalol which dropped her systolic blood pressure to a more appropriate range in the 140-160 range. She was started on a heparin drip and continued on IV nitroglycerin and transferred to [facility].  After arrival to [facility] cone she was evaluated by the cardiology fellow who was concern for possible non-STEMI. By the time she arrived her chest pain had resolved. Apparently this patient has had at least 2 separate trips to the ER for similar symptoms in the past few weeks. Based on her symptoms cardiologist on a.m. rounds on 9/15 that it would be reasonable to pursue diagnostic cardiac catheterization since her symptoms are concerning for angina. Plans are to coordinate this procedure with patient's usual hemodialysis schedule. Repeat troponin after arrival to [facility] was less than 0.30.   HPI/Subjective: No cp-denies red or black stools/emesis of  late.    Assessment/Plan:    Hypertensive emergency resolved at this time-current blood pressure controlled on Lopressor - follow     NSTEMI/ 2V CAD Cardiology following-cardiac catheterization revealed LAD and high grade RCA stenosis;Cards reviewing angiograms further before considering PCI-continue beta blocker, aspirin, and statin. Was on Plavix prior to admission-cont IV NTG   Anemia ? Etilogy: hgb on 9/8 was 11.6 and now down to 7.2-pt unaware of GIB sx's-ck anemia panel. TSH and FOB- Cards rec keep hgb >/= 10 with underlying CAD-can transfuse with next HD on Friday since hemodynamically stable, unless drops further in AM - if guiac + will need GI eval while inpatient     CKD  stage V requiring chronic dialysis Nephrology following-usual dialysis days are TTS    Leukocytosis/Abnormal urinalysis Patient unable to clarify if has residual voiding-urinalysis abnormal and urine culture pending-since she is a dialysis patient and at risk for bacteremia we will check blood cultures-no clear cut source for infection so no indication to add antibiotics at this time    Type II diabetes mellitus on insulin Current CBGs well controlled-continue Levemir and sliding scale insulin    Vascular dementia    Peripheral vascular disease  DVT prophylaxis: Heparin infusion Code Status: Full Family Communication: No family at bedside Disposition Plan/Expected LOS: Step down  Consultants: Cardiology Nephrology  Procedures: Cardiac catheterization: Low normal global LV contractility with an ejection fraction of 50-55% with a small region of subtle mid-distal inferior hypocontractility. Predominant two-vessel coronary artery disease with 50-60% tubular stenosis involving the LAD ostium and proximal segment, with 80-90% mid LAD stenoses, 80% mid-distal LAD stenosis; 80% stenosis in a small branch of the OM  3 vessel; and high-grade RCA stenoses with 85% eccentric proximal, 95%, mid, and 85% mid distal  stenoses.  Antibiotics: None  Objective: Blood pressure 193/107, pulse 85, temperature 98.1 F (36.7 C), temperature source Oral, resp. rate 18, height 5\' 2"  (1.575 m), weight 114 lb 6.7 oz (51.9 kg), SpO2 100.00%.  Intake/Output Summary (Last 24 hours) at 12/11/13 1431 Last data filed at 12/11/13 1200  Gross per 24 hour  Intake  923.6 ml  Output   1850 ml  Net -926.4 ml   Exam: Gen: No acute respiratory distress Chest: Clear to auscultation bilaterally without wheezes, rhonchi or crackles, room air Cardiac: Regular rate and rhythm, S1-S2, no rubs murmurs or gallops, no peripheral edema, no JVD Abdomen: Soft nontender nondistended without obvious hepatosplenomegaly, no ascites Extremities: Symmetrical in appearance without cyanosis, clubbing or effusion  Scheduled Meds:  Scheduled Meds: . antiseptic oral rinse  7 mL Mouth Rinse BID  . aspirin EC  81 mg Oral Daily  . atorvastatin  40 mg Oral q1800  . calcitRIOL  0.25 mcg Oral Q T,Th,Sa-HD  . clopidogrel  75 mg Oral Q breakfast  . [START ON 12/16/2013] darbepoetin (ARANESP) injection - DIALYSIS  100 mcg Intravenous Q Mon-HD  . ferric gluconate (FERRLECIT/NULECIT) IV  62.5 mg Intravenous Weekly  . insulin aspart  0-9 Units Subcutaneous 6 times per day  . insulin detemir  14 Units Subcutaneous Q2200  . metoprolol tartrate  75 mg Oral BID  . multivitamin  1 tablet Oral QHS  . oxyCODONE  5 mg Oral TID  . sodium chloride  3 mL Intravenous Q12H  . sodium chloride  3 mL Intravenous Q12H   Continuous Infusions: . sodium chloride    . heparin 700 Units/hr (12/11/13 1413)  . nitroGLYCERIN 10 mcg/min (12/10/13 2348)    Data Reviewed: Basic Metabolic Panel:  Recent Labs Lab 12/10/13 0607 12/11/13 0340  NA 139 133*  K 4.4 4.9  CL 94* 92*  CO2 23 23  GLUCOSE 112* 70  BUN 56* 65*  CREATININE 6.98* 7.86*  CALCIUM 8.7 8.7   Liver Function Tests: No results found for this basename: AST, ALT, ALKPHOS, BILITOT, PROT, ALBUMIN,   in the last 168 hours  CBC:  Recent Labs Lab 12/10/13 0607 12/10/13 1700 12/11/13 0340  WBC 14.0* 9.5 9.3  HGB 8.5* 7.4* 7.2*  HCT 24.8* 22.0* 21.1*  MCV 83.2 82.1 82.4  PLT 453* 417* 395   Cardiac Enzymes:  Recent Labs Lab 12/10/13 0607 12/10/13 1020 12/10/13 1700  TROPONINI <0.30 <0.30 <0.30   CBG:  Recent Labs Lab 12/11/13 0108 12/11/13 0501 12/11/13 0544 12/11/13 1240 12/11/13 1403  GLUCAP 106* 58* 113* 62* 42*    Recent Results (from the past 240 hour(s))  MRSA PCR SCREENING     Status: None   Collection Time    12/10/13  4:01 AM      Result Value Ref Range Status   MRSA by PCR NEGATIVE  NEGATIVE Final   Comment:            The GeneXpert MRSA Assay (FDA     approved for NASAL specimens     only), is one component of a     comprehensive MRSA colonization     surveillance program. It is not     intended to diagnose MRSA     infection nor to guide or     monitor treatment for     MRSA infections.  CULTURE, BLOOD (ROUTINE X 2)  Status: None   Collection Time    12/10/13 10:20 AM      Result Value Ref Range Status   Specimen Description BLOOD RIGHT HAND   Final   Special Requests BOTTLES DRAWN AEROBIC ONLY 5CC   Final   Culture  Setup Time     Final   Value: 12/10/2013 16:05     Performed at [facility]   Culture     Final   Value:        BLOOD CULTURE RECEIVED NO GROWTH TO DATE CULTURE WILL BE HELD FOR 5 DAYS BEFORE ISSUING A FINAL NEGATIVE REPORT     Performed at [facility]   Report Status PENDING   Incomplete  CULTURE, BLOOD (ROUTINE X 2)     Status: None   Collection Time    12/10/13 10:30 AM      Result Value Ref Range Status   Specimen Description BLOOD RIGHT HAND   Final   Special Requests BOTTLES DRAWN AEROBIC ONLY 3CC   Final   Culture  Setup Time     Final   Value: 12/10/2013 16:05     Performed at [facility]   Culture     Final   Value:        BLOOD CULTURE RECEIVED NO GROWTH TO DATE CULTURE WILL BE  HELD FOR 5 DAYS BEFORE ISSUING A FINAL NEGATIVE REPORT     Performed at [facility]   Report Status PENDING   Incomplete     Studies:  Recent x-ray studies have been reviewed in detail by the Attending Physician  Time spent : 35 mins      Erin Hearing, ANP [facility] Office  [phone] Pager [phone]   On-Call/Text Page:      [email or web address]      password [email or web address]  If 7PM-7AM, please contact night-coverage [email or web address] Password [email or web address] 12/11/2013, 2:31 PM   LOS: 1 day    I have personally examined this patient and reviewed the entire database. I have reviewed the above note, made any necessary editorial changes, and agree with its content.  Cherene Altes, MD [facility]

## 2013-12-11 NOTE — Progress Notes (Signed)
Patients c/s 164 after Dextrose amp given. Patient awake and alert talking with family

## 2013-12-11 NOTE — Progress Notes (Signed)
ANTICOAGULATION CONSULT NOTE - Follow Up Consult  Pharmacy Consult for heparin Indication: CAD  Labs:  Recent Labs  12/10/13 0607  12/10/13 1020 12/10/13 1700 12/11/13 0340 12/11/13 0924 12/11/13 1049 12/11/13 2215  HGB 8.5*  --   --  7.4* 7.2*  --   --   --   HCT 24.8*  --   --  22.0* 21.1*  --   --   --   PLT 453*  --   --  417* 395  --   --   --   LABPROT  --   --   --  14.6  --   --   --   --   INR  --   --   --  1.14  --   --   --   --   HEPARINUNFRC  --   < > 0.81*  --   --  <0.10* 0.11* 0.15*  CREATININE 6.98*  --   --   --  7.86*  --   --   --   TROPONINI <0.30  --  <0.30 <0.30  --   --   --   --   < > = values in this interval not displayed.   Assessment: 63yo female remains subtherapeutic on heparin with little change in level despite rate increase.  RN reports no outward signs of bleeding and also states that blood sample was difficult to draw from central line with potential for bad sample, hemoccult pending.  Goal of Therapy:  Heparin level 0.3-0.7 units/ml   Plan:  Will increase heparin gtt by 3 units/kg/hr to 850 units/hr and check level in 8hr and continue to monitor for signs of bleeding.  Wynona Neat, PharmD, BCPS  12/11/2013,11:18 PM

## 2013-12-11 NOTE — Progress Notes (Signed)

## 2013-12-11 NOTE — Progress Notes (Signed)
ANTICOAGULATION CONSULT NOTE - Follow Up Consult  Pharmacy Consult for Heparin Indication: CAD (s/p cath)  Allergies  Allergen Reactions  . Phenergan [Promethazine Hcl]     unknown    Patient Measurements: Height: 5\' 2"  (157.5 cm) Weight: 114 lb 6.7 oz (51.9 kg) IBW/kg (Calculated) : 50.1 Heparin Dosing Weight: 52.8 kg  Vital Signs: Temp: 98.1 F (36.7 C) (09/16 1235) Temp src: Oral (09/16 1235) BP: 193/107 mmHg (09/16 1300) Pulse Rate: 85 (09/16 1300)  Labs:  Recent Labs  12/10/13 0607 12/10/13 1020 12/10/13 1700 12/11/13 0340 12/11/13 0924 12/11/13 1049  HGB 8.5*  --  7.4* 7.2*  --   --   HCT 24.8*  --  22.0* 21.1*  --   --   PLT 453*  --  417* 395  --   --   LABPROT  --   --  14.6  --   --   --   INR  --   --  1.14  --   --   --   HEPARINUNFRC  --  0.81*  --   --  <0.10* 0.11*  CREATININE 6.98*  --   --  7.86*  --   --   TROPONINI <0.30 <0.30 <0.30  --   --   --     Estimated Creatinine Clearance: 5.9 ml/min (by C-G formula based on Cr of 7.86).   Medications:  Scheduled:  . antiseptic oral rinse  7 mL Mouth Rinse BID  . aspirin EC  81 mg Oral Daily  . atorvastatin  40 mg Oral q1800  . calcitRIOL  0.25 mcg Oral Q T,Th,Sa-HD  . clopidogrel  75 mg Oral Q breakfast  . [START ON 12/16/2013] darbepoetin (ARANESP) injection - DIALYSIS  100 mcg Intravenous Q Mon-HD  . ferric gluconate (FERRLECIT/NULECIT) IV  62.5 mg Intravenous Weekly  . insulin aspart  0-9 Units Subcutaneous 6 times per day  . insulin detemir  14 Units Subcutaneous Q2200  . metoprolol tartrate  75 mg Oral BID  . multivitamin  1 tablet Oral QHS  . oxyCODONE  5 mg Oral TID  . sodium chloride  3 mL Intravenous Q12H  . sodium chloride  3 mL Intravenous Q12H   Infusions:  . sodium chloride    . heparin 600 Units/hr (12/11/13 0214)  . nitroGLYCERIN 10 mcg/min (12/10/13 2348)    Assessment: 63 yo F s/p cardiac cath showing two vessel CAD and pending decision for PCI.  Heparin level was  previously 0.82 on heparin at 600 units/hr and now confirmed low (heparin level = 0.11). Hg noted 7.2 with trend down (8.5 9/15).     Goal of Therapy:  Heparin level 0.3-0.7 units/ml Monitor platelets by anticoagulation protocol: Yes   Plan:  -Increase heparin to 700 units/hr (small increase due to Hg change) -Heparin level in 8hrs -Heparin level and CBC daily  Hildred Laser, Pharm D 12/11/2013 2:05 PM     Hildred Laser, Pharm D 12/11/2013 1:48 PM

## 2013-12-11 NOTE — Progress Notes (Signed)
Subjective:  No CP/SOB. S/P LHC with signif 2VD  Objective:  Temp:  [97.1 F (36.2 C)-98.1 F (36.7 C)] 97.8 F (36.6 C) (09/16 1056) Pulse Rate:  [67-110] 92 (09/16 1203) Resp:  [0-23] 13 (09/16 1101) BP: (95-186)/(34-133) 139/45 mmHg (09/16 1203) SpO2:  [97 %-100 %] 100 % (09/16 1056) Weight:  [114 lb 6.7 oz (51.9 kg)-116 lb 13.5 oz (53 kg)] 114 lb 6.7 oz (51.9 kg) (09/16 1056) Weight change: 7.1 oz (0.2 kg)  Intake/Output from previous day: 09/15 0701 - 09/16 0700 In: 841.8 [P.O.:580; I.V.:156.8; IV Piggyback:105] Out: 150 [Urine:150]  Intake/Output from this shift: Total I/O In: -  Out: 1700 [Other:1700]  Physical Exam: General appearance: alert and no distress Neck: no adenopathy, no carotid bruit, no JVD, supple, symmetrical, trachea midline and thyroid not enlarged, symmetric, no tenderness/mass/nodules Lungs: clear to auscultation bilaterally Heart: regular rate and rhythm, S1, S2 normal, no murmur, click, rub or gallop Extremities: extremities normal, atraumatic, no cyanosis or edema and Right fem arterial puncture site OK  Lab Results: Results for orders placed during the hospital encounter of 12/10/13 (from the past 48 hour(s))  MRSA PCR SCREENING     Status: None   Collection Time    12/10/13  4:01 AM      Result Value Ref Range   MRSA by PCR NEGATIVE  NEGATIVE   Comment:            The GeneXpert MRSA Assay (FDA     approved for NASAL specimens     only), is one component of a     comprehensive MRSA colonization     surveillance program. It is not     intended to diagnose MRSA     infection nor to guide or     monitor treatment for     MRSA infections.  GLUCOSE, CAPILLARY     Status: Abnormal   Collection Time    12/10/13  5:01 AM      Result Value Ref Range   Glucose-Capillary 107 (*) 70 - 99 mg/dL   Comment 1 Capillary Sample    CBC     Status: Abnormal   Collection Time    12/10/13  6:07 AM      Result Value Ref Range   WBC 14.0 (*)  4.0 - 10.5 K/uL   RBC 2.98 (*) 3.87 - 5.11 MIL/uL   Hemoglobin 8.5 (*) 12.0 - 15.0 g/dL   HCT 24.8 (*) 36.0 - 46.0 %   MCV 83.2  78.0 - 100.0 fL   MCH 28.5  26.0 - 34.0 pg   MCHC 34.3  30.0 - 36.0 g/dL   RDW 14.3  11.5 - 15.5 %   Platelets 453 (*) 150 - 400 K/uL  BASIC METABOLIC PANEL     Status: Abnormal   Collection Time    12/10/13  6:07 AM      Result Value Ref Range   Sodium 139  137 - 147 mEq/L   Potassium 4.4  3.7 - 5.3 mEq/L   Chloride 94 (*) 96 - 112 mEq/L   CO2 23  19 - 32 mEq/L   Glucose, Bld 112 (*) 70 - 99 mg/dL   BUN 56 (*) 6 - 23 mg/dL   Creatinine, Ser 6.98 (*) 0.50 - 1.10 mg/dL   Calcium 8.7  8.4 - 10.5 mg/dL   GFR calc non Af Amer 6 (*) >90 mL/min   GFR calc Af Amer 7 (*) >90 mL/min  Comment: (NOTE)     The eGFR has been calculated using the CKD EPI equation.     This calculation has not been validated in all clinical situations.     eGFR's persistently <90 mL/min signify possible Chronic Kidney     Disease.   Anion gap 22 (*) 5 - 15  TROPONIN I     Status: None   Collection Time    12/10/13  6:07 AM      Result Value Ref Range   Troponin I <0.30  <0.30 ng/mL   Comment:            Due to the release kinetics of cTnI,     a negative result within the first hours     of the onset of symptoms does not rule out     myocardial infarction with certainty.     If myocardial infarction is still suspected,     repeat the test at appropriate intervals.  GLUCOSE, CAPILLARY     Status: Abnormal   Collection Time    12/10/13  7:40 AM      Result Value Ref Range   Glucose-Capillary 110 (*) 70 - 99 mg/dL   Comment 1 Capillary Sample    TROPONIN I     Status: None   Collection Time    12/10/13 10:20 AM      Result Value Ref Range   Troponin I <0.30  <0.30 ng/mL   Comment:            Due to the release kinetics of cTnI,     a negative result within the first hours     of the onset of symptoms does not rule out     myocardial infarction with certainty.     If  myocardial infarction is still suspected,     repeat the test at appropriate intervals.  HEPARIN LEVEL (UNFRACTIONATED)     Status: Abnormal   Collection Time    12/10/13 10:20 AM      Result Value Ref Range   Heparin Unfractionated 0.81 (*) 0.30 - 0.70 IU/mL   Comment:            IF HEPARIN RESULTS ARE BELOW     EXPECTED VALUES, AND PATIENT     DOSAGE HAS BEEN CONFIRMED,     SUGGEST FOLLOW UP TESTING     OF ANTITHROMBIN III LEVELS.  CULTURE, BLOOD (ROUTINE X 2)     Status: None   Collection Time    12/10/13 10:20 AM      Result Value Ref Range   Specimen Description BLOOD RIGHT HAND     Special Requests BOTTLES DRAWN AEROBIC ONLY 5CC     Culture  Setup Time       Value: 12/10/2013 16:05     Performed at [facility]   Culture       Value:        BLOOD CULTURE RECEIVED NO GROWTH TO DATE CULTURE WILL BE HELD FOR 5 DAYS BEFORE ISSUING A FINAL NEGATIVE REPORT     Performed at [facility]   Report Status PENDING    CULTURE, BLOOD (ROUTINE X 2)     Status: None   Collection Time    12/10/13 10:30 AM      Result Value Ref Range   Specimen Description BLOOD RIGHT HAND     Special Requests BOTTLES DRAWN AEROBIC ONLY 3CC     Culture  Setup Time  Value: 12/10/2013 16:05     Performed at [facility]   Culture       Value:        BLOOD CULTURE RECEIVED NO GROWTH TO DATE CULTURE WILL BE HELD FOR 5 DAYS BEFORE ISSUING A FINAL NEGATIVE REPORT     Performed at [facility]   Report Status PENDING    GLUCOSE, CAPILLARY     Status: Abnormal   Collection Time    12/10/13  1:03 PM      Result Value Ref Range   Glucose-Capillary 121 (*) 70 - 99 mg/dL   Comment 1 Capillary Sample    TROPONIN I     Status: None   Collection Time    12/10/13  5:00 PM      Result Value Ref Range   Troponin I <0.30  <0.30 ng/mL   Comment:            Due to the release kinetics of cTnI,     a negative result within the first hours     of the onset of symptoms does not  rule out     myocardial infarction with certainty.     If myocardial infarction is still suspected,     repeat the test at appropriate intervals.  PROTIME-INR     Status: None   Collection Time    12/10/13  5:00 PM      Result Value Ref Range   Prothrombin Time 14.6  11.6 - 15.2 seconds   INR 1.14  0.00 - 1.49  CBC     Status: Abnormal   Collection Time    12/10/13  5:00 PM      Result Value Ref Range   WBC 9.5  4.0 - 10.5 K/uL   RBC 2.68 (*) 3.87 - 5.11 MIL/uL   Hemoglobin 7.4 (*) 12.0 - 15.0 g/dL   HCT 22.0 (*) 36.0 - 46.0 %   MCV 82.1  78.0 - 100.0 fL   MCH 27.6  26.0 - 34.0 pg   MCHC 33.6  30.0 - 36.0 g/dL   RDW 14.3  11.5 - 15.5 %   Platelets 417 (*) 150 - 400 K/uL  POCT ACTIVATED CLOTTING TIME     Status: None   Collection Time    12/10/13  5:28 PM      Result Value Ref Range   Activated Clotting Time 112    GLUCOSE, CAPILLARY     Status: Abnormal   Collection Time    12/10/13  5:49 PM      Result Value Ref Range   Glucose-Capillary 67 (*) 70 - 99 mg/dL  GLUCOSE, CAPILLARY     Status: Abnormal   Collection Time    12/10/13  9:48 PM      Result Value Ref Range   Glucose-Capillary 109 (*) 70 - 99 mg/dL  GLUCOSE, CAPILLARY     Status: Abnormal   Collection Time    12/11/13  1:08 AM      Result Value Ref Range   Glucose-Capillary 106 (*) 70 - 99 mg/dL  CBC     Status: Abnormal   Collection Time    12/11/13  3:40 AM      Result Value Ref Range   WBC 9.3  4.0 - 10.5 K/uL   RBC 2.56 (*) 3.87 - 5.11 MIL/uL   Hemoglobin 7.2 (*) 12.0 - 15.0 g/dL   HCT 21.1 (*) 36.0 - 46.0 %   MCV 82.4  78.0 -  100.0 fL   MCH 28.1  26.0 - 34.0 pg   MCHC 34.1  30.0 - 36.0 g/dL   RDW 14.4  11.5 - 15.5 %   Platelets 395  150 - 400 K/uL  BASIC METABOLIC PANEL     Status: Abnormal   Collection Time    12/11/13  3:40 AM      Result Value Ref Range   Sodium 133 (*) 137 - 147 mEq/L   Potassium 4.9  3.7 - 5.3 mEq/L   Chloride 92 (*) 96 - 112 mEq/L   CO2 23  19 - 32 mEq/L   Glucose,  Bld 70  70 - 99 mg/dL   BUN 65 (*) 6 - 23 mg/dL   Creatinine, Ser 7.86 (*) 0.50 - 1.10 mg/dL   Calcium 8.7  8.4 - 10.5 mg/dL   GFR calc non Af Amer 5 (*) >90 mL/min   GFR calc Af Amer 6 (*) >90 mL/min   Comment: (NOTE)     The eGFR has been calculated using the CKD EPI equation.     This calculation has not been validated in all clinical situations.     eGFR's persistently <90 mL/min signify possible Chronic Kidney     Disease.   Anion gap 18 (*) 5 - 15  GLUCOSE, CAPILLARY     Status: Abnormal   Collection Time    12/11/13  5:01 AM      Result Value Ref Range   Glucose-Capillary 58 (*) 70 - 99 mg/dL   Comment 1 Capillary Sample    GLUCOSE, CAPILLARY     Status: Abnormal   Collection Time    12/11/13  5:44 AM      Result Value Ref Range   Glucose-Capillary 113 (*) 70 - 99 mg/dL  TYPE AND SCREEN     Status: None   Collection Time    12/11/13  8:15 AM      Result Value Ref Range   ABO/RH(D) O POS     Antibody Screen NEG     Sample Expiration 12/14/2013     Unit Number [id number]     Blood Component Type RED CELLS,LR     Unit division 00     Status of Unit ISSUED     Transfusion Status OK TO TRANSFUSE     Crossmatch Result Compatible     Unit Number [id number]     Blood Component Type RED CELLS,LR     Unit division 00     Status of Unit ISSUED     Transfusion Status OK TO TRANSFUSE     Crossmatch Result Compatible    PREPARE RBC (CROSSMATCH)     Status: None   Collection Time    12/11/13  8:15 AM      Result Value Ref Range   Order Confirmation ORDER PROCESSED BY BLOOD BANK    ABO/RH     Status: None   Collection Time    12/11/13  8:15 AM      Result Value Ref Range   ABO/RH(D) O POS    RETICULOCYTES     Status: Abnormal   Collection Time    12/11/13  8:30 AM      Result Value Ref Range   Retic Ct Pct 2.2  0.4 - 3.1 %   RBC. 2.72 (*) 3.87 - 5.11 MIL/uL   Retic Count, Manual 59.8  19.0 - 186.0 K/uL  TSH     Status: None   Collection Time  12/11/13  8:30  AM      Result Value Ref Range   TSH 0.471  0.350 - 4.500 uIU/mL  HEPARIN LEVEL (UNFRACTIONATED)     Status: Abnormal   Collection Time    12/11/13  9:24 AM      Result Value Ref Range   Heparin Unfractionated <0.10 (*) 0.30 - 0.70 IU/mL   Comment:            IF HEPARIN RESULTS ARE BELOW     EXPECTED VALUES, AND PATIENT     DOSAGE HAS BEEN CONFIRMED,     SUGGEST FOLLOW UP TESTING     OF ANTITHROMBIN III LEVELS.     REPEATED TO VERIFY  HEPARIN LEVEL (UNFRACTIONATED)     Status: Abnormal   Collection Time    12/11/13 10:49 AM      Result Value Ref Range   Heparin Unfractionated 0.11 (*) 0.30 - 0.70 IU/mL   Comment:            IF HEPARIN RESULTS ARE BELOW     EXPECTED VALUES, AND PATIENT     DOSAGE HAS BEEN CONFIRMED,     SUGGEST FOLLOW UP TESTING     OF ANTITHROMBIN III LEVELS.    Imaging: Imaging results have been reviewed  Assessment/Plan:   1. Active Problems: 2.   CKD (chronic kidney disease) stage V requiring chronic dialysis 3.   Type II or unspecified type diabetes mellitus without mention of complication, not stated as uncontrolled 4.   Vascular dementia 5.   Peripheral vascular disease, unspecified 6.   Hypertensive emergency 7.   NSTEMI (non-ST elevated myocardial infarction) 8.   Leukocytosis 9.   Abnormal urinalysis 10.   Malnutrition of moderate degree 11.   Chest pain 12.   Time Spent Directly with Patient:  20 minutes  Length of Stay:  LOS: 1 day   S/P Cath yesterday by Dr. Leda Gauze revealing signif 2VD with low nl LV fxn. HGB low and would benefit from Tx 2U PRBCs to get HGB > 10 especially with angina and CAD. Can prob be done during HD. Etiol of low HGB being investigated. Will review cath films. If intervention should prob be with BMS. Remains on IV hep/NTG. Will adjust BB.  Lorretta Harp 12/11/2013, 12:17 PM

## 2013-12-11 NOTE — Progress Notes (Signed)
Patient resting in bed awake and alert visiting with family. C/S 42, Patient not displaying any signs or symptoms of hypoglycemia. Dextrose 25mg /21ml injection given via [employer]. Patient tolerated well. Will reacess c/s.

## 2013-12-11 NOTE — Procedures (Signed)
Patient was seen on dialysis and the procedure was supervised. BFR 400 Via LUE AVG BP is 126/54.  Patient appears to be tolerating treatment well.  Cardiac cath results noted: Predominant two-vessel coronary artery disease with 50-60% tubular stenosis involving the LAD ostium and proximal segment, with 80-90% mid LAD stenoses, 80% mid-distal LAD stenosis; 80% stenosis in a small branch of the OM 3 vessel; and high-grade RCA stenoses with 85% eccentric proximal, 95%, mid, and 85% mid distal stenoses.  Await consideration for possible intervention of RCA lesions.  Agree with improving anemia and will adjust EPO and check iron stores and guaiac stools.

## 2013-12-12 DIAGNOSIS — I70219 Atherosclerosis of native arteries of extremities with intermittent claudication, unspecified extremity: Secondary | ICD-10-CM

## 2013-12-12 DIAGNOSIS — D72829 Elevated white blood cell count, unspecified: Secondary | ICD-10-CM

## 2013-12-12 DIAGNOSIS — Z992 Dependence on renal dialysis: Secondary | ICD-10-CM

## 2013-12-12 DIAGNOSIS — E1165 Type 2 diabetes mellitus with hyperglycemia: Secondary | ICD-10-CM

## 2013-12-12 DIAGNOSIS — IMO0001 Reserved for inherently not codable concepts without codable children: Secondary | ICD-10-CM

## 2013-12-12 DIAGNOSIS — I96 Gangrene, not elsewhere classified: Secondary | ICD-10-CM

## 2013-12-12 LAB — GLUCOSE, CAPILLARY
GLUCOSE-CAPILLARY: 116 mg/dL — AB (ref 70–99)
GLUCOSE-CAPILLARY: 128 mg/dL — AB (ref 70–99)
GLUCOSE-CAPILLARY: 117 mg/dL — AB (ref 70–99)
GLUCOSE-CAPILLARY: 131 mg/dL — AB (ref 70–99)
GLUCOSE-CAPILLARY: 151 mg/dL — AB (ref 70–99)
Glucose-Capillary: 110 mg/dL — ABNORMAL HIGH (ref 70–99)
Glucose-Capillary: 166 mg/dL — ABNORMAL HIGH (ref 70–99)

## 2013-12-12 LAB — BASIC METABOLIC PANEL
Anion gap: 18 — ABNORMAL HIGH (ref 5–15)
BUN: 27 mg/dL — AB (ref 6–23)
CO2: 25 mEq/L (ref 19–32)
Calcium: 8.7 mg/dL (ref 8.4–10.5)
Chloride: 93 mEq/L — ABNORMAL LOW (ref 96–112)
Creatinine, Ser: 4.08 mg/dL — ABNORMAL HIGH (ref 0.50–1.10)
GFR, EST AFRICAN AMERICAN: 13 mL/min — AB (ref >90)
GFR, EST NON AFRICAN AMERICAN: 11 mL/min — AB (ref >90)
GLUCOSE: 109 mg/dL — AB (ref 70–99)
Potassium: 3.8 mEq/L (ref 3.7–5.3)
SODIUM: 136 meq/L — AB (ref 137–147)

## 2013-12-12 LAB — HEPARIN LEVEL (UNFRACTIONATED)
Heparin Unfractionated: 0.24 IU/mL — ABNORMAL LOW (ref 0.30–0.70)
Heparin Unfractionated: 0.47 IU/mL (ref 0.30–0.70)

## 2013-12-12 LAB — URINE CULTURE: Colony Count: >=100000

## 2013-12-12 LAB — TYPE AND SCREEN
ABO/RH(D): O POS
ANTIBODY SCREEN: NEGATIVE
UNIT DIVISION: 0
Unit division: 0

## 2013-12-12 LAB — CBC
HCT: 30.5 % — ABNORMAL LOW (ref 36.0–46.0)
HEMOGLOBIN: 10.5 g/dL — AB (ref 12.0–15.0)
MCH: 28.6 pg (ref 26.0–34.0)
MCHC: 34.4 g/dL (ref 30.0–36.0)
MCV: 83.1 fL (ref 78.0–100.0)
Platelets: 389 10*3/uL (ref 150–400)
RBC: 3.67 MIL/uL — ABNORMAL LOW (ref 3.87–5.11)
RDW: 14.5 % (ref 11.5–15.5)
WBC: 13.5 10*3/uL — ABNORMAL HIGH (ref 4.0–10.5)

## 2013-12-12 MED ORDER — ASPIRIN 81 MG PO CHEW
81.0000 mg | CHEWABLE_TABLET | ORAL | Status: AC
  Administered 2013-12-13: 81 mg via ORAL
  Filled 2013-12-12: qty 1

## 2013-12-12 MED ORDER — HYDRALAZINE HCL 20 MG/ML IJ SOLN: 5.0000 mg | INTRAMUSCULAR | Status: DC | PRN

## 2013-12-12 MED ORDER — SODIUM CHLORIDE 0.9 % IJ SOLN: 3.0000 mL | Freq: Two times a day (BID) | INTRAMUSCULAR | Status: DC

## 2013-12-12 MED ORDER — METOPROLOL TARTRATE 100 MG PO TABS
100.0000 mg | ORAL_TABLET | Freq: Two times a day (BID) | ORAL | Status: DC
  Administered 2013-12-12 – 2013-12-16 (×8): 100 mg via ORAL
  Filled 2013-12-12 (×3): qty 1
  Filled 2013-12-12: qty 4
  Filled 2013-12-12 (×5): qty 1
  Filled 2013-12-12: qty 4
  Filled 2013-12-12 (×3): qty 1

## 2013-12-12 MED ORDER — SODIUM CHLORIDE 0.9 % IV SOLN: 250.0000 mL | INTRAVENOUS | Status: DC | PRN

## 2013-12-12 MED ORDER — SODIUM CHLORIDE 0.9 % IJ SOLN: 3.0000 mL | INTRAMUSCULAR | Status: DC | PRN

## 2013-12-12 NOTE — Progress Notes (Addendum)
[name]  MADELYN TLATELPA LOV:[id number] DOB: 1950-06-02 DOA: 12/10/2013 PCP: Marco Collie, MD   Brief narrative: 63 year old BF PMHx  chronic kidney disease on dialysis (TTS), vascular dementia ,diabetes as well as hypertension, dry gangrene right great toe She presented to the emergency department at [facility] with a one-day history of chest pain. The pain was located in the central chest. This apparently was associated with a feeling of generalized malaise. Because of the symptoms the daughter had the patient not take her usual Norvasc dosage prior to arrival. The patient's daughter also postulated to the admitting physician that patient may have had too much fluid taken off with most recent dialysis treatment this past Saturday.  Upon evaluation at the [facility] emergency department patient was quite hypertensive with systolic blood pressure readings in the 220 range and an initial troponin level of 0.47. Patient's chest pain symptoms resolved after receiving oxygen and nitroglycerin. She was also given 20 mg of labetalol which dropped her systolic blood pressure to a more appropriate range in the 140-160 range. She was started on a heparin drip and continued on IV nitroglycerin and transferred to [facility].  After arrival to [facility] she was evaluated by the cardiology fellow who was concern for possible non-STEMI. By the time she arrived her chest pain had resolved. Apparently this patient has had at least 2 separate trips to the ER for similar symptoms in the past few weeks. Based on her symptoms cardiologist on a.m. rounds on 9/15 that it would be reasonable to pursue diagnostic cardiac catheterization since her symptoms are concerning for angina. Plans are to coordinate this procedure with patient's usual hemodialysis schedule. Repeat troponin after arrival to [facility] was less than 0.30.   HPI/Subjective: No cp-denies red or black  stools/emesis of late. However states has excruciating pain in her right great toe    Assessment/Plan:    Hypertensive emergency -Increase metoprolol to 100 mg BID -Hydralazine IV 5 mg PRN SBP> 160 or DBP>100    NSTEMI/ 2V CAD Cardiology following-cardiac catheterization revealed LAD and high grade RCA stenosis; Cards planning PCI 9/18-continue IV NTG and Heparin, beta blocker, aspirin, and statin. Was on Plavix prior to admission -Patient meets criteria for CABG by cardiac catheterization performed on 9/17. Will need to discuss treatment options with cardiology in a.m.   Anemia ? Etilogy: hgb on 9/8 was 11.6 and now down to 7.2-pt unaware of GIB sx's-ck anemia panel. TSH and FOB- Cards rec keep hgb >/= 10 with underlying CAD-our recommendation was to transfuse with next HD on 9/18 since hemodynamically stable or if drops further- note that PRBCs were given on 9/16 and now hgb >10 - if guiac + will need GI eval while inpatient - Iron 119, UIBC less than 15 with TIBC and saturation rates not calculated, ferritin elevated at 988 with normal folate and vitamin B12-doubt iron overload since UIBC/TIBC low therefore elevated ferritin likely related to use of erythropoietin stimulating agents in setting of chronic kidney disease; could also be acute phase reactant in setting of acute illness/acute inflammatory process    CKD  stage V requiring chronic dialysis Nephrology following-usual dialysis days are T/Th/S    Leukocytosis/Abnormal urinalysis Patient unable to clarify if has residual voiding-urinalysis abnormal and urine culture with multiple contaminant-since she is a dialysis patient and at risk for bacteremia but blood cultures with NGTD-no clear cut source for infection so no indication to add antibiotics at  this time   Necrotic right great toe/  Peripheral vascular disease Previously evaluated by Dr. 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 She was deemed not a candidate for bypass procedure due to overall debility and nonambulatory status-also the type of disease she has would not be amenable to percutaneous treatment-focused to be on narcotic pain control and if she develops nonhealing wounds or gangrenous changes she would need a primary amputation    Type II diabetes mellitus on insulin Current CBGs well controlled-continue Levemir and sliding scale insulin    Vascular dementia   DVT prophylaxis: Heparin infusion Code Status: Full Family Communication: No family at bedside Disposition Plan/Expected LOS: Step down  Consultants: Cardiology Nephrology  Procedures: Cardiac catheterization: LVEF= 50-55% with a small region of subtle mid-distal inferior hypocontractility.  -Predominant two-vessel coronary artery disease with 50-60% tubular stenosis involving the LAD ostium and proximal segment, with 80-90% mid LAD stenoses, 80% mid-distal LAD stenosis; 80% stenosis in a small branch of the OM 3 vessel; and high-grade RCA stenoses with 85% eccentric proximal, 95%, mid, and 85% mid distal stenoses.  Cultures 9/15 urine multiple bacterial morphotypes 9/15 blood right hand x2 NGTD   Antibiotics: None  Objective: Blood pressure 166/52, pulse 91, temperature 98.7 F (37.1 C), temperature source Oral, resp. rate 16, height 5\' 2"  (1.575 m), weight 114 lb 6.7 oz (51.9 kg), SpO2 100.00%.  Intake/Output Summary (Last 24 hours) at 12/12/13 1253 Last data filed at 12/12/13 1100  Gross per 24 hour  Intake 708.51 ml  Output      0 ml  Net 708.51 ml   Exam: Gen: No acute respiratory distress Chest: Clear to auscultation bilaterally, room air Cardiac: Regular rate and rhythm, S1-S2, no rubs murmurs or gallops, no peripheral edema, no JVD Abdomen: Soft nontender nondistended, no ascites Extremities:  Symmetrical in appearance without cyanosis, clubbing or effusion. Right great toe dry gangrene present extending up slightly past the metatarsophalangeal joint.   Scheduled Meds:  Scheduled Meds: . amLODipine  10 mg Oral Daily  . antiseptic oral rinse  7 mL Mouth Rinse BID  . aspirin EC  81 mg Oral Daily  . atorvastatin  40 mg Oral q1800  . calcitRIOL  0.25 mcg Oral Q T,Th,Sa-HD  . clopidogrel  75 mg Oral Q breakfast  . [START ON 12/16/2013] darbepoetin (ARANESP) injection - DIALYSIS  100 mcg Intravenous Q Mon-HD  . ferric gluconate (FERRLECIT/NULECIT) IV  62.5 mg Intravenous Weekly  . insulin aspart  0-9 Units Subcutaneous 6 times per day  . metoprolol tartrate  75 mg Oral BID  . multivitamin  1 tablet Oral QHS  . oxyCODONE  5 mg Oral TID  . sodium chloride  3 mL Intravenous Q12H  . sodium chloride  3 mL Intravenous Q12H   Continuous Infusions: . sodium chloride    . heparin 950 Units/hr (12/12/13 1247)  . nitroGLYCERIN 30 mcg/min (12/12/13 1100)    Data Reviewed: Basic Metabolic Panel:  Recent Labs Lab 12/10/13 0607 12/11/13 0340 12/12/13 0452  NA 139 133* 136*  K 4.4 4.9 3.8  CL 94* 92* 93*  CO2 23 23 25   GLUCOSE 112* 70 109*  BUN 56* 65* 27*  CREATININE 6.98* 7.86* 4.08*  CALCIUM 8.7 8.7 8.7   Liver Function Tests: No results found for this basename: AST, ALT, ALKPHOS, BILITOT, PROT, ALBUMIN,  in the  last 168 hours  CBC:  Recent Labs Lab 12/10/13 0607 12/10/13 1700 12/11/13 0340 12/12/13 0452  WBC 14.0* 9.5 9.3 13.5*  HGB 8.5* 7.4* 7.2* 10.5*  HCT 24.8* 22.0* 21.1* 30.5*  MCV 83.2 82.1 82.4 83.1  PLT 453* 417* 395 389   Cardiac Enzymes:  Recent Labs Lab 12/10/13 0607 12/10/13 1020 12/10/13 1700  TROPONINI <0.30 <0.30 <0.30   CBG:  Recent Labs Lab 12/11/13 2341 12/12/13 0431 12/12/13 0448 12/12/13 0803 12/12/13 1206  GLUCAP 95 110* 116* 128* 166*    Recent Results (from the past 240 hour(s))  MRSA PCR SCREENING     Status: None    Collection Time    12/10/13  4:01 AM      Result Value Ref Range Status   MRSA by PCR NEGATIVE  NEGATIVE Final   Comment:            The GeneXpert MRSA Assay (FDA     approved for NASAL specimens     only), is one component of a     comprehensive MRSA colonization     surveillance program. It is not     intended to diagnose MRSA     infection nor to guide or     monitor treatment for     MRSA infections.  URINE CULTURE     Status: None   Collection Time    12/10/13  8:00 AM      Result Value Ref Range Status   Specimen Description URINE, RANDOM   Final   Special Requests add on if possible o/w obtain via I/O cath   Final   Culture  Setup Time     Final   Value: 12/11/2013 03:20     Performed at [facility] Count     Final   Value: >=100,000 COLONIES/ML     Performed at [facility]   Culture     Final   Value: Multiple bacterial morphotypes present, none predominant. Suggest appropriate recollection if clinically indicated.     Performed at [facility]   Report Status 12/12/2013 FINAL   Final  CULTURE, BLOOD (ROUTINE X 2)     Status: None   Collection Time    12/10/13 10:20 AM      Result Value Ref Range Status   Specimen Description BLOOD RIGHT HAND   Final   Special Requests BOTTLES DRAWN AEROBIC ONLY 5CC   Final   Culture  Setup Time     Final   Value: 12/10/2013 16:05     Performed at [facility]   Culture     Final   Value:        BLOOD CULTURE RECEIVED NO GROWTH TO DATE CULTURE WILL BE HELD FOR 5 DAYS BEFORE ISSUING A FINAL NEGATIVE REPORT     Performed at [facility]   Report Status PENDING   Incomplete  CULTURE, BLOOD (ROUTINE X 2)     Status: None   Collection Time    12/10/13 10:30 AM      Result Value Ref Range Status   Specimen Description BLOOD RIGHT HAND   Final   Special Requests BOTTLES DRAWN AEROBIC ONLY 3CC   Final   Culture  Setup Time     Final   Value: 12/10/2013 16:05     Performed at [facility]   Culture     Final   Value:  BLOOD CULTURE RECEIVED NO GROWTH TO DATE CULTURE WILL BE HELD FOR 5 DAYS BEFORE ISSUING A FINAL NEGATIVE REPORT     Performed at [facility]   Report Status PENDING   Incomplete     Studies:  Recent x-ray studies have been reviewed in detail by the Attending Physician  Time spent : 35 mins      Erin Hearing, ANP [facility] Office  [phone] Pager [phone]   On-Call/Text Page:      [email or web address]      password [email or web address]  If 7PM-7AM, please contact night-coverage [email or web address] Password [email or web address] 12/12/2013, 12:53 PM   LOS: 2 days   Examined patient and discussed assessment and plan with ANP Ebony Hail, agree with above plan Patient with multiple complex medical problems> 40 minutes spent in direct patient care

## 2013-12-12 NOTE — Progress Notes (Addendum)
ANTICOAGULATION CONSULT NOTE - Follow Up Consult  Pharmacy Consult for heparin Indication: CAD s/p cath  Allergies  Allergen Reactions  . Phenergan [Promethazine Hcl]     unknown    Patient Measurements: Height: 5\' 2"  (157.5 cm) Weight: 114 lb 6.7 oz (51.9 kg) IBW/kg (Calculated) : 50.1 Heparin Dosing Weight: 52 kg  Vital Signs: Temp: 98.7 F (37.1 C) (09/17 0800) Temp src: Oral (09/17 0800) BP: 166/52 mmHg (09/17 1003) Pulse Rate: 91 (09/17 1003)  Labs:  Recent Labs  12/10/13 0607 12/10/13 1020 12/10/13 1700 12/11/13 0340  12/11/13 1049 12/11/13 2215 12/12/13 0452 12/12/13 0802  HGB 8.5*  --  7.4* 7.2*  --   --   --  10.5*  --   HCT 24.8*  --  22.0* 21.1*  --   --   --  30.5*  --   PLT 453*  --  417* 395  --   --   --  389  --   LABPROT  --   --  14.6  --   --   --   --   --   --   INR  --   --  1.14  --   --   --   --   --   --   HEPARINUNFRC  --  0.81*  --   --   < > 0.11* 0.15*  --  0.24*  CREATININE 6.98*  --   --  7.86*  --   --   --  4.08*  --   TROPONINI <0.30 <0.30 <0.30  --   --   --   --   --   --   < > = values in this interval not displayed.  Estimated Creatinine Clearance: 11.3 ml/min (by C-G formula based on Cr of 4.08).   Medications:  Scheduled:  . amLODipine  10 mg Oral Daily  . antiseptic oral rinse  7 mL Mouth Rinse BID  . aspirin EC  81 mg Oral Daily  . atorvastatin  40 mg Oral q1800  . calcitRIOL  0.25 mcg Oral Q T,Th,Sa-HD  . clopidogrel  75 mg Oral Q breakfast  . [START ON 12/16/2013] darbepoetin (ARANESP) injection - DIALYSIS  100 mcg Intravenous Q Mon-HD  . ferric gluconate (FERRLECIT/NULECIT) IV  62.5 mg Intravenous Weekly  . insulin aspart  0-9 Units Subcutaneous 6 times per day  . metoprolol tartrate  75 mg Oral BID  . multivitamin  1 tablet Oral QHS  . oxyCODONE  5 mg Oral TID  . sodium chloride  3 mL Intravenous Q12H  . sodium chloride  3 mL Intravenous Q12H   Infusions:  . sodium chloride    . heparin 850 Units/hr  (12/12/13 0735)  . nitroGLYCERIN 30 mcg/min (12/12/13 0300)    Assessment: 63 yo f admitted on 9/15 for chest pain.  Patient is s/p cath that found two-vessel disease involving the LAD and RCA.  Plans for PCI to be determined.  Pharmacy is consulted to dose heparin.  HL this AM was subtherapeutic again at 0.24. Will increase heparin drip to 950 units/hr and check an 8-hr HL @ 1930. Hgb 10.5, plts 389, no s/s of bleeding.  Goal of Therapy:  Heparin level 0.3-0.7 units/ml Monitor platelets by anticoagulation protocol: Yes   Plan:  Increase heparin drip to 950 units/hr 8-hr HL @ 1930 Monitor hgb/plts, s/s of bleeding, clinical course, plans for PCI  Cassie L. Nicole Kindred, PharmD Clinical Pharmacy Resident Pager: [phone] 12/12/2013  11:24 AM   Addendum:  Follow up heparin level tonight was at goal (0.4) on 950 units/hr. No bleeding reported. Continue at current rate.  Erin Hearing PharmD., BCPS Clinical Pharmacist Pager [phone] 12/12/2013 8:57 PM

## 2013-12-12 NOTE — Progress Notes (Signed)
Patient ID: [name], female   DOB: [birth year], 63 y.o.   MRN: [id number]  [facility] Progress Note    Subjective:   Feels well, no complaints   Objective:   BP 152/55  Pulse 85  Temp(Src) 98.7 F (37.1 C) (Oral)  Resp 16  Ht 5\' 2"  (1.575 m)  Wt 51.9 kg (114 lb 6.7 oz)  BMI 20.92 kg/m2  SpO2 98%  Intake/Output: I/O last 3 completed shifts: In: 1189.6 [P.O.:780; I.V.:304.6; IV Piggyback:105] Out: 9924 [Urine:150; Other:1700]   Intake/Output this shift:    Weight change: -1.1 kg (-2 lb 6.8 oz)  Physical Exam: QAS:TMHDQQI AAF in NAD CVS:no rub Resp:cta WLN:LGXQJJ Ext:no edema, LUE AVG +T/B  Labs: BMET  Recent Labs Lab 12/10/13 0607 12/11/13 0340 12/12/13 0452  NA 139 133* 136*  K 4.4 4.9 3.8  CL 94* 92* 93*  CO2 23 23 25   GLUCOSE 112* 70 109*  BUN 56* 65* 27*  CREATININE 6.98* 7.86* 4.08*  CALCIUM 8.7 8.7 8.7   CBC  Recent Labs Lab 12/10/13 0607 12/10/13 1700 12/11/13 0340 12/12/13 0452  WBC 14.0* 9.5 9.3 13.5*  HGB 8.5* 7.4* 7.2* 10.5*  HCT 24.8* 22.0* 21.1* 30.5*  MCV 83.2 82.1 82.4 83.1  PLT 453* 417* 395 389    @IMGRELPRIORS @ Medications:    . amLODipine  10 mg Oral Daily  . antiseptic oral rinse  7 mL Mouth Rinse BID  . aspirin EC  81 mg Oral Daily  . atorvastatin  40 mg Oral q1800  . calcitRIOL  0.25 mcg Oral Q T,Th,Sa-HD  . clopidogrel  75 mg Oral Q breakfast  . [START ON 12/16/2013] darbepoetin (ARANESP) injection - DIALYSIS  100 mcg Intravenous Q Mon-HD  . ferric gluconate (FERRLECIT/NULECIT) IV  62.5 mg Intravenous Weekly  . insulin aspart  0-9 Units Subcutaneous 6 times per day  . metoprolol tartrate  75 mg Oral BID  . multivitamin  1 tablet Oral QHS  . oxyCODONE  5 mg Oral TID  . sodium chloride  3 mL Intravenous Q12H  . sodium chloride  3 mL Intravenous Q12H     Dialysis Orders: TTS @[facility]   3hr 45 min 52.5kgs 2K/2Ca 6000Heparin 160 350/1.5 L AVG  calcitriol 0.25 mcg Aranesp 25 q 2 weeks (last 9.1)  Venofer 50 q wk  Profile 2  Assessment/Plan:  1. CAD/NSTEMI- Heparin and nitro drip. Cath revealed predominant two-vessel coronary artery disease with 50-60% tubular stenosis involving the LAD ostium and proximal segment, with 80-90% mid LAD stenoses, 80% mid-distal LAD stenosis; 80% stenosis in a small branch of the OM 3 vessel; and high-grade RCA stenoses with 85% eccentric proximal, 95%, mid, and 85% mid distal stenoses. Await consideration for possible intervention of RCA lesions 2. ESRD - TTS Mark. HD post cath today. K+ 4.4 3. Hypertension/volume -171/133 left under edw, watch during HD, may need edw lowered if tolerats. Metop. Home norvasc. Only 1.3 L off Saturday.  4. Anemia - Hgb has fluctuated since admission- baseline 10-11. Started ESA 12/11/13 and had significant increase in Hgb over the last 24 hours without transfusion.  Will recheck CBC to verify and would only transfuse if Hgb is <9 (given NSTEMI and severe CAD). Cont weekly Fe.  5. Metabolic bone disease - Ca+ 8.7. Cont calcitriol and phoslo when diet advanced. Last phos 5.9 and PTH 345  6. Nutrition - NPO for cath. Renal vit.  7. DM- per primary  8.   [name] A 12/12/2013, 9:06 AM

## 2013-12-12 NOTE — Progress Notes (Addendum)
Subjective:  No CP/SOB  Objective:  Temp:  [98.1 F (36.7 C)-99.5 F (37.5 C)] 98.7 F (37.1 C) (09/17 0800) Pulse Rate:  [79-92] 91 (09/17 1003) Resp:  [15-22] 16 (09/17 0800) BP: (139-193)/(42-107) 166/52 mmHg (09/17 1003) SpO2:  [98 %-100 %] 98 % (09/17 0800) Weight change: -2 lb 6.8 oz (-1.1 kg)  Intake/Output from previous day: 09/16 0701 - 09/17 0700 In: 794.4 [P.O.:540; I.V.:254.4] Out: 1700   Intake/Output from this shift:    Physical Exam: General appearance: alert and no distress Neck: no adenopathy, no carotid bruit, no JVD, supple, symmetrical, trachea midline and thyroid not enlarged, symmetric, no tenderness/mass/nodules Lungs: clear to auscultation bilaterally Heart: regular rate and rhythm, S1, S2 normal, no murmur, click, rub or gallop Extremities: extremities normal, atraumatic, no cyanosis or edema  Lab Results: Results for orders placed during the hospital encounter of 12/10/13 (from the past 48 hour(s))  GLUCOSE, CAPILLARY     Status: Abnormal   Collection Time    12/10/13  1:03 PM      Result Value Ref Range   Glucose-Capillary 121 (*) 70 - 99 mg/dL   Comment 1 Capillary Sample    TROPONIN I     Status: None   Collection Time    12/10/13  5:00 PM      Result Value Ref Range   Troponin I <0.30  <0.30 ng/mL   Comment:            Due to the release kinetics of cTnI,     a negative result within the first hours     of the onset of symptoms does not rule out     myocardial infarction with certainty.     If myocardial infarction is still suspected,     repeat the test at appropriate intervals.  PROTIME-INR     Status: None   Collection Time    12/10/13  5:00 PM      Result Value Ref Range   Prothrombin Time 14.6  11.6 - 15.2 seconds   INR 1.14  0.00 - 1.49  CBC     Status: Abnormal   Collection Time    12/10/13  5:00 PM      Result Value Ref Range   WBC 9.5  4.0 - 10.5 K/uL   RBC 2.68 (*) 3.87 - 5.11 MIL/uL   Hemoglobin 7.4 (*)  12.0 - 15.0 g/dL   HCT 22.0 (*) 36.0 - 46.0 %   MCV 82.1  78.0 - 100.0 fL   MCH 27.6  26.0 - 34.0 pg   MCHC 33.6  30.0 - 36.0 g/dL   RDW 14.3  11.5 - 15.5 %   Platelets 417 (*) 150 - 400 K/uL  POCT ACTIVATED CLOTTING TIME     Status: None   Collection Time    12/10/13  5:28 PM      Result Value Ref Range   Activated Clotting Time 112    GLUCOSE, CAPILLARY     Status: Abnormal   Collection Time    12/10/13  5:49 PM      Result Value Ref Range   Glucose-Capillary 67 (*) 70 - 99 mg/dL  GLUCOSE, CAPILLARY     Status: Abnormal   Collection Time    12/10/13  9:48 PM      Result Value Ref Range   Glucose-Capillary 109 (*) 70 - 99 mg/dL  GLUCOSE, CAPILLARY     Status: Abnormal   Collection Time  12/11/13  1:08 AM      Result Value Ref Range   Glucose-Capillary 106 (*) 70 - 99 mg/dL  CBC     Status: Abnormal   Collection Time    12/11/13  3:40 AM      Result Value Ref Range   WBC 9.3  4.0 - 10.5 K/uL   RBC 2.56 (*) 3.87 - 5.11 MIL/uL   Hemoglobin 7.2 (*) 12.0 - 15.0 g/dL   HCT 21.1 (*) 36.0 - 46.0 %   MCV 82.4  78.0 - 100.0 fL   MCH 28.1  26.0 - 34.0 pg   MCHC 34.1  30.0 - 36.0 g/dL   RDW 14.4  11.5 - 15.5 %   Platelets 395  150 - 400 K/uL  BASIC METABOLIC PANEL     Status: Abnormal   Collection Time    12/11/13  3:40 AM      Result Value Ref Range   Sodium 133 (*) 137 - 147 mEq/L   Potassium 4.9  3.7 - 5.3 mEq/L   Chloride 92 (*) 96 - 112 mEq/L   CO2 23  19 - 32 mEq/L   Glucose, Bld 70  70 - 99 mg/dL   BUN 65 (*) 6 - 23 mg/dL   Creatinine, Ser 7.86 (*) 0.50 - 1.10 mg/dL   Calcium 8.7  8.4 - 10.5 mg/dL   GFR calc non Af Amer 5 (*) >90 mL/min   GFR calc Af Amer 6 (*) >90 mL/min   Comment: (NOTE)     The eGFR has been calculated using the CKD EPI equation.     This calculation has not been validated in all clinical situations.     eGFR's persistently <90 mL/min signify possible Chronic Kidney     Disease.   Anion gap 18 (*) 5 - 15  GLUCOSE, CAPILLARY     Status:  Abnormal   Collection Time    12/11/13  5:01 AM      Result Value Ref Range   Glucose-Capillary 58 (*) 70 - 99 mg/dL   Comment 1 Capillary Sample    GLUCOSE, CAPILLARY     Status: Abnormal   Collection Time    12/11/13  5:44 AM      Result Value Ref Range   Glucose-Capillary 113 (*) 70 - 99 mg/dL  TYPE AND SCREEN     Status: None   Collection Time    12/11/13  8:15 AM      Result Value Ref Range   ABO/RH(D) O POS     Antibody Screen NEG     Sample Expiration 12/14/2013     Unit Number [id number]     Blood Component Type RED CELLS,LR     Unit division 00     Status of Unit ISSUED,FINAL     Transfusion Status OK TO TRANSFUSE     Crossmatch Result Compatible     Unit Number [id number]     Blood Component Type RED CELLS,LR     Unit division 00     Status of Unit ISSUED,FINAL     Transfusion Status OK TO TRANSFUSE     Crossmatch Result Compatible    PREPARE RBC (CROSSMATCH)     Status: None   Collection Time    12/11/13  8:15 AM      Result Value Ref Range   Order Confirmation ORDER PROCESSED BY BLOOD BANK    ABO/RH     Status: None   Collection Time  12/11/13  8:15 AM      Result Value Ref Range   ABO/RH(D) O POS    VITAMIN B12     Status: None   Collection Time    12/11/13  8:30 AM      Result Value Ref Range   Vitamin B-12 752  211 - 911 pg/mL   Comment: Performed at [facility]     Status: None   Collection Time    12/11/13  8:30 AM      Result Value Ref Range   Folate 9.4     Comment: (NOTE)     Reference Ranges            Deficient:       0.4 - 3.3 ng/mL            Indeterminate:   3.4 - 5.4 ng/mL            Normal:              > 5.4 ng/mL     Performed at [facility] TIBC     Status: Abnormal   Collection Time    12/11/13  8:30 AM      Result Value Ref Range   Iron 119  42 - 135 ug/dL   TIBC NOT CALC  250 - 470 ug/dL   Comment: TIBC and %SAT were not calculated due to the UIBC being <15.   Saturation Ratios  NOT CALC  20 - 55 %   Comment: TIBC and %SAT were not calculated due to the UIBC being <15.   UIBC <15 (*) 125 - 400 ug/dL   Comment: Performed at [facility]     Status: Abnormal   Collection Time    12/11/13  8:30 AM      Result Value Ref Range   Ferritin 988 (*) 10 - 291 ng/mL   Comment: Performed at [facility]     Status: Abnormal   Collection Time    12/11/13  8:30 AM      Result Value Ref Range   Retic Ct Pct 2.2  0.4 - 3.1 %   RBC. 2.72 (*) 3.87 - 5.11 MIL/uL   Retic Count, Manual 59.8  19.0 - 186.0 K/uL  TSH     Status: None   Collection Time    12/11/13  8:30 AM      Result Value Ref Range   TSH 0.471  0.350 - 4.500 uIU/mL  HEPARIN LEVEL (UNFRACTIONATED)     Status: Abnormal   Collection Time    12/11/13  9:24 AM      Result Value Ref Range   Heparin Unfractionated <0.10 (*) 0.30 - 0.70 IU/mL   Comment:            IF HEPARIN RESULTS ARE BELOW     EXPECTED VALUES, AND PATIENT     DOSAGE HAS BEEN CONFIRMED,     SUGGEST FOLLOW UP TESTING     OF ANTITHROMBIN III LEVELS.     REPEATED TO VERIFY  HEPARIN LEVEL (UNFRACTIONATED)     Status: Abnormal   Collection Time    12/11/13 10:49 AM      Result Value Ref Range   Heparin Unfractionated 0.11 (*) 0.30 - 0.70 IU/mL   Comment:            IF HEPARIN RESULTS ARE BELOW     EXPECTED VALUES, AND  PATIENT     DOSAGE HAS BEEN CONFIRMED,     SUGGEST FOLLOW UP TESTING     OF ANTITHROMBIN III LEVELS.  GLUCOSE, CAPILLARY     Status: Abnormal   Collection Time    12/11/13 12:40 PM      Result Value Ref Range   Glucose-Capillary 62 (*) 70 - 99 mg/dL   Comment 1 Capillary Sample    GLUCOSE, CAPILLARY     Status: Abnormal   Collection Time    12/11/13  2:03 PM      Result Value Ref Range   Glucose-Capillary 42 (*) 70 - 99 mg/dL  GLUCOSE, CAPILLARY     Status: Abnormal   Collection Time    12/11/13  2:49 PM      Result Value Ref Range   Glucose-Capillary 164 (*) 70 - 99 mg/dL    GLUCOSE, CAPILLARY     Status: Abnormal   Collection Time    12/11/13  6:07 PM      Result Value Ref Range   Glucose-Capillary 154 (*) 70 - 99 mg/dL  GLUCOSE, CAPILLARY     Status: Abnormal   Collection Time    12/11/13  7:31 PM      Result Value Ref Range   Glucose-Capillary 164 (*) 70 - 99 mg/dL  HEPARIN LEVEL (UNFRACTIONATED)     Status: Abnormal   Collection Time    12/11/13 10:15 PM      Result Value Ref Range   Heparin Unfractionated 0.15 (*) 0.30 - 0.70 IU/mL   Comment:            IF HEPARIN RESULTS ARE BELOW     EXPECTED VALUES, AND PATIENT     DOSAGE HAS BEEN CONFIRMED,     SUGGEST FOLLOW UP TESTING     OF ANTITHROMBIN III LEVELS.  GLUCOSE, CAPILLARY     Status: None   Collection Time    12/11/13 11:41 PM      Result Value Ref Range   Glucose-Capillary 95  70 - 99 mg/dL  GLUCOSE, CAPILLARY     Status: Abnormal   Collection Time    12/12/13  4:31 AM      Result Value Ref Range   Glucose-Capillary 110 (*) 70 - 99 mg/dL   Comment 1 Capillary Sample    GLUCOSE, CAPILLARY     Status: Abnormal   Collection Time    12/12/13  4:48 AM      Result Value Ref Range   Glucose-Capillary 116 (*) 70 - 99 mg/dL   Comment 1 Venous Sample    CBC     Status: Abnormal   Collection Time    12/12/13  4:52 AM      Result Value Ref Range   WBC 13.5 (*) 4.0 - 10.5 K/uL   RBC 3.67 (*) 3.87 - 5.11 MIL/uL   Hemoglobin 10.5 (*) 12.0 - 15.0 g/dL   Comment: DELTA CHECK NOTED     POST TRANSFUSION SPECIMEN   HCT 30.5 (*) 36.0 - 46.0 %   MCV 83.1  78.0 - 100.0 fL   MCH 28.6  26.0 - 34.0 pg   MCHC 34.4  30.0 - 36.0 g/dL   RDW 14.5  11.5 - 15.5 %   Platelets 389  150 - 400 K/uL  BASIC METABOLIC PANEL     Status: Abnormal   Collection Time    12/12/13  4:52 AM      Result Value Ref Range   Sodium 136 (*)  137 - 147 mEq/L   Potassium 3.8  3.7 - 5.3 mEq/L   Comment: DELTA CHECK NOTED   Chloride 93 (*) 96 - 112 mEq/L   CO2 25  19 - 32 mEq/L   Glucose, Bld 109 (*) 70 - 99 mg/dL   BUN  27 (*) 6 - 23 mg/dL   Comment: DELTA CHECK NOTED   Creatinine, Ser 4.08 (*) 0.50 - 1.10 mg/dL   Comment: DIALYSIS   Calcium 8.7  8.4 - 10.5 mg/dL   GFR calc non Af Amer 11 (*) >90 mL/min   GFR calc Af Amer 13 (*) >90 mL/min   Comment: (NOTE)     The eGFR has been calculated using the CKD EPI equation.     This calculation has not been validated in all clinical situations.     eGFR's persistently <90 mL/min signify possible Chronic Kidney     Disease.   Anion gap 18 (*) 5 - 15  HEPARIN LEVEL (UNFRACTIONATED)     Status: Abnormal   Collection Time    12/12/13  8:02 AM      Result Value Ref Range   Heparin Unfractionated 0.24 (*) 0.30 - 0.70 IU/mL   Comment:            IF HEPARIN RESULTS ARE BELOW     EXPECTED VALUES, AND PATIENT     DOSAGE HAS BEEN CONFIRMED,     SUGGEST FOLLOW UP TESTING     OF ANTITHROMBIN III LEVELS.  GLUCOSE, CAPILLARY     Status: Abnormal   Collection Time    12/12/13  8:03 AM      Result Value Ref Range   Glucose-Capillary 128 (*) 70 - 99 mg/dL   Comment 1 Capillary Sample      Imaging: Imaging results have been reviewed  Assessment/Plan:   1. Active Problems: 2.   CKD (chronic kidney disease) stage V requiring chronic dialysis 3.   Type II or unspecified type diabetes mellitus without mention of complication, not stated as uncontrolled 4.   Vascular dementia 5.   Peripheral vascular disease, unspecified 6.   Hypertensive emergency 7.   NSTEMI (non-ST elevated myocardial infarction) 8.   Leukocytosis 9.   Abnormal urinalysis 10.   Malnutrition of moderate degree 11.   Chest pain 12.   Time Spent Directly with Patient:  15 minutes  Length of Stay:  LOS: 2 days   No CP on IV hep/ntg. 2VD by cath, diffusely diseased small caliber LAD, high grade dom RCA (prob culprit). On IV hep/ntg. Will prob require RCA intervention. HGB today 10.5. HD today.  Will review angiograms with colleagues.  Lorretta Harp 12/12/2013, 11:17 AM  N.B I have  reviewed angiograms with Drs Claiborne Billings and Julianne Handler and the consensus is to precede with HSRA, PCI and stent Co Dominant RCA (probably the culprit lesion) tomorrow.

## 2013-12-12 NOTE — Progress Notes (Signed)
CARE MANAGEMENT NOTE 12/12/2013  Patient:  [name], [name]   Account Number:  [id number]  Date Initiated:  12/10/2013  Documentation initiated by:  DAVIS,RHONDA  Subjective/Objective Assessment:   pt transferred from [facility] [facility] with nstemi and hx of renal failure requiring hd/     Action/Plan:   home when stable   Anticipated DC Date:  12/13/2013   Anticipated DC Plan:  HOME/SELF CARE  In-house referral  NA      DC Planning Services  CM consult      PAC Choice  NA   Choice offered to / List presented to:  NA      DME agency  NA     [place] arranged  NA      [place] agency  NA   Status of service:  In process, will continue to follow Medicare Important Message given?   (If response is "NO", the following Medicare IM given date fields will be blank) Date Medicare IM given:   Medicare IM given by:   Date Additional Medicare IM given:   Additional Medicare IM given by:    Discharge Disposition:    Per UR Regulation:  Reviewed for med. necessity/level of care/duration of stay  If discussed at Long Length of Stay Meetings, dates discussed:    Comments:  [id number]/Rhonda Davis,RN,BSN,CCM: Hypertensive emergency/NSTEMI/ 2V CAD Cardiology following-cardiac catheterization revealed LAD and high grade RCA stenosis; Cards planning PCI 9/18-continue IV NTG and Heparin, beta blocker, aspirin, and statin. Was on Plavix prior to admission

## 2013-12-13 ENCOUNTER — Encounter (HOSPITAL_COMMUNITY)
Admission: AD | Disposition: A | Discharge status: Home Health | Payer: Medicare Other | Source: Other Acute Inpatient Hospital | Attending: Internal Medicine

## 2013-12-13 HISTORY — PX: PERCUTANEOUS CORONARY ROTOBLATOR INTERVENTION (PCI-R): SHX5484

## 2013-12-13 LAB — HEPARIN LEVEL (UNFRACTIONATED): HEPARIN UNFRACTIONATED: 0.43 [IU]/mL (ref 0.30–0.70)

## 2013-12-13 LAB — GLUCOSE, CAPILLARY
GLUCOSE-CAPILLARY: 109 mg/dL — AB (ref 70–99)
Glucose-Capillary: 124 mg/dL — ABNORMAL HIGH (ref 70–99)
Glucose-Capillary: 103 mg/dL — ABNORMAL HIGH (ref 70–99)
Glucose-Capillary: 113 mg/dL — ABNORMAL HIGH (ref 70–99)
Glucose-Capillary: 215 mg/dL — ABNORMAL HIGH (ref 70–99)
Glucose-Capillary: 113 mg/dL — ABNORMAL HIGH (ref 70–99)

## 2013-12-13 LAB — RENAL FUNCTION PANEL
ANION GAP: 14 (ref 5–15)
Albumin: 2.2 g/dL — ABNORMAL LOW (ref 3.5–5.2)
BUN: 42 mg/dL — AB (ref 6–23)
CHLORIDE: 90 meq/L — AB (ref 96–112)
CO2: 32 mEq/L (ref 19–32)
Calcium: 8.8 mg/dL (ref 8.4–10.5)
Creatinine, Ser: 5.73 mg/dL — ABNORMAL HIGH (ref 0.50–1.10)
GFR calc Af Amer: 8 mL/min — ABNORMAL LOW (ref >90)
GFR, EST NON AFRICAN AMERICAN: 7 mL/min — AB (ref >90)
Glucose, Bld: 95 mg/dL (ref 70–99)
POTASSIUM: 3.4 meq/L — AB (ref 3.7–5.3)
Phosphorus: 5.6 mg/dL — ABNORMAL HIGH (ref 2.3–4.6)
Sodium: 136 mEq/L — ABNORMAL LOW (ref 137–147)

## 2013-12-13 LAB — CBC
HEMATOCRIT: 25.9 % — AB (ref 36.0–46.0)
HEMATOCRIT: 25.2 % — AB (ref 36.0–46.0)
Hemoglobin: 8.8 g/dL — ABNORMAL LOW (ref 12.0–15.0)
Hemoglobin: 8.6 g/dL — ABNORMAL LOW (ref 12.0–15.0)
MCH: 28.8 pg (ref 26.0–34.0)
MCH: 29.7 pg (ref 26.0–34.0)
MCHC: 34 g/dL (ref 30.0–36.0)
MCHC: 34.1 g/dL (ref 30.0–36.0)
MCV: 84.6 fL (ref 78.0–100.0)
MCV: 86.9 fL (ref 78.0–100.0)
PLATELETS: 392 10*3/uL (ref 150–400)
PLATELETS: 377 10*3/uL (ref 150–400)
RBC: 3.06 MIL/uL — AB (ref 3.87–5.11)
RBC: 2.9 MIL/uL — ABNORMAL LOW (ref 3.87–5.11)
RDW: 14.6 % (ref 11.5–15.5)
RDW: 14.7 % (ref 11.5–15.5)
WBC: 12 10*3/uL — AB (ref 4.0–10.5)
WBC: 9.9 10*3/uL (ref 4.0–10.5)

## 2013-12-13 LAB — POCT ACTIVATED CLOTTING TIME
Activated Clotting Time: 287 seconds
Activated Clotting Time: 197 s
Activated Clotting Time: 185 s
Activated Clotting Time: 157 seconds

## 2013-12-13 LAB — PLATELET COUNT: Platelets: 380 10*3/uL (ref 150–400)

## 2013-12-13 SURGERY — PERCUTANEOUS CORONARY ROTOBLATOR INTERVENTION (PCI-R)
Anesthesia: LOCAL

## 2013-12-13 MED ORDER — NITROGLYCERIN 1 MG/10 ML FOR IR/CATH LAB
INTRA_ARTERIAL | Status: AC
  Filled 2013-12-13: qty 10

## 2013-12-13 MED ORDER — SODIUM CHLORIDE 0.9 % IV SOLN
INTRAVENOUS | Status: DC
  Administered 2013-12-13: 12:00:00 via INTRAVENOUS

## 2013-12-13 MED ORDER — MIDAZOLAM HCL 2 MG/2ML IJ SOLN
INTRAMUSCULAR | Status: AC
  Filled 2013-12-13: qty 2

## 2013-12-13 MED ORDER — ONDANSETRON HCL 4 MG/2ML IJ SOLN: 4.0000 mg | Freq: Four times a day (QID) | INTRAMUSCULAR | Status: DC | PRN

## 2013-12-13 MED ORDER — HEPARIN SODIUM (PORCINE) 1000 UNIT/ML IJ SOLN
INTRAMUSCULAR | Status: AC
  Filled 2013-12-13: qty 1

## 2013-12-13 MED ORDER — BIVALIRUDIN 250 MG IV SOLR
INTRAVENOUS | Status: AC
  Filled 2013-12-13: qty 250

## 2013-12-13 MED ORDER — VERAPAMIL HCL 2.5 MG/ML IV SOLN
INTRAVENOUS | Status: AC
  Filled 2013-12-13: qty 2

## 2013-12-13 MED ORDER — HEPARIN SODIUM (PORCINE) 1000 UNIT/ML DIALYSIS
20.0000 [IU]/kg | INTRAMUSCULAR | Status: DC | PRN
  Filled 2013-12-13: qty 1

## 2013-12-13 MED ORDER — FENTANYL CITRATE 0.05 MG/ML IJ SOLN
INTRAMUSCULAR | Status: AC
  Filled 2013-12-13: qty 2

## 2013-12-13 MED ORDER — CLOPIDOGREL BISULFATE 300 MG PO TABS
ORAL_TABLET | ORAL | Status: AC
  Filled 2013-12-13: qty 1

## 2013-12-13 MED ORDER — NITROPRUSSIDE SODIUM 25 MG/ML IV SOLN
0.2500 ug/kg/min | INTRAVENOUS | Status: DC
  Filled 2013-12-13: qty 2

## 2013-12-13 MED ORDER — CLOPIDOGREL BISULFATE 75 MG PO TABS: 75.0000 mg | ORAL_TABLET | Freq: Every day | ORAL | Status: DC

## 2013-12-13 MED ORDER — ACETAMINOPHEN 325 MG PO TABS: 650.0000 mg | ORAL_TABLET | ORAL | Status: DC | PRN

## 2013-12-13 MED ORDER — HEPARIN (PORCINE) IN NACL 2-0.9 UNIT/ML-% IJ SOLN
INTRAMUSCULAR | Status: AC
  Filled 2013-12-13: qty 1500

## 2013-12-13 MED ORDER — LIDOCAINE HCL (PF) 1 % IJ SOLN
INTRAMUSCULAR | Status: AC
  Filled 2013-12-13: qty 30

## 2013-12-13 MED ORDER — ASPIRIN EC 81 MG PO TBEC: 81.0000 mg | DELAYED_RELEASE_TABLET | Freq: Every day | ORAL | Status: DC

## 2013-12-13 MED ORDER — NITROGLYCERIN 0.2 MG/ML ON CALL CATH LAB
INTRAVENOUS | Status: AC
  Filled 2013-12-13: qty 2

## 2013-12-13 MED ORDER — CLOPIDOGREL BISULFATE 300 MG PO TABS
ORAL_TABLET | ORAL | Status: AC
  Administered 2013-12-14: 75 mg via ORAL
  Filled 2013-12-13: qty 1

## 2013-12-13 NOTE — Interval H&P Note (Signed)
Cath Lab Visit (complete for each Cath Lab visit)  Clinical Evaluation Leading to the Procedure:   ACS: No.  Non-ACS:    Anginal Classification: CCS IV  Anti-ischemic medical therapy: Maximal Therapy (2 or more classes of medications)  Non-Invasive Test Results: No non-invasive testing performed  Prior CABG: No previous CABG      History and Physical Interval Note:  12/13/2013 9:32 AM  [name]  has presented today for surgery, with the diagnosis of cad  The various methods of treatment have been discussed with the patient and family. After consideration of risks, benefits and other options for treatment, the patient has consented to  Procedure(s): PERCUTANEOUS CORONARY ROTOBLATOR INTERVENTION (PCI-R) (N/A) as a surgical intervention .  The patient's history has been reviewed, patient examined, no change in status, stable for surgery.  I have reviewed the patient's chart and labs.  Questions were answered to the patient's satisfaction.     Deyna Carbon A

## 2013-12-13 NOTE — Progress Notes (Signed)
CARE MANAGEMENT NOTE 12/13/2013  Patient:  [name], [name]   Account Number:  [id number]  Date Initiated:  12/10/2013  Documentation initiated by:  Jaymari Cromie  Subjective/Objective Assessment:   pt transferred from [place] reg with nstemi and hx of renal failure requiring hd/     Action/Plan:   home when stable   Anticipated DC Date:  12/16/2013   Anticipated DC Plan:  HOME/SELF CARE  In-house referral  NA      DC Planning Services  CM consult      PAC Choice  NA   Choice offered to / List presented to:  NA      DME agency  NA     [place] arranged  NA      [place] agency  NA   Status of service:  In process, will continue to follow Medicare Important Message given?   (If response is "NO", the following Medicare IM given date fields will be blank) Date Medicare IM given:   Medicare IM given by:   Date Additional Medicare IM given:   Additional Medicare IM given by:    Discharge Disposition:    Per UR Regulation:  Reviewed for med. necessity/level of care/duration of stay  If discussed at [place] of Stay Meetings, dates discussed:    Comments:  [id number]/Melanny Wire,RN,BSN,CCM: patient for heart cath today/iv heparin held for cath lab/iv ntg cont.'d/  [id number]/Wang [place],RN,BSN,CCM: Hypertensive emergency/NSTEMI/ 2V CAD Cardiology following-cardiac catheterization revealed LAD and high grade RCA stenosis; Cards planning PCI 9/18-continue IV NTG and Heparin, beta blocker, aspirin, and statin. Was on Plavix prior to admission   [id number]/Tip Atienza,RN,BSN,CCM

## 2013-12-13 NOTE — Progress Notes (Addendum)
Patient's right femoral arterial sheath removed (by Ollen Bowl., RN) at 16:55 and right venous sheath removed (by Debbie in cath lab) at 16:58.  Right posterior tibial pulse dopplered before and after sheath pull, site level 0. Pressure held on site until hemostasis achieved, total of 25 minutes.  Patient instructed to keep head on pillow and brace groin if she needs to cough/sneeze or laugh. [place], Ardeth Sportsman

## 2013-12-13 NOTE — Progress Notes (Signed)
RN unable to draw adequate amount of blood from patient's central line for CBC.  Attending MD notified and lab notified to attempt to collect lab. [name], Ardeth Sportsman

## 2013-12-13 NOTE — Progress Notes (Signed)
[name]  BITA CARTWRIGHT JEH:[id number] DOB: Jun 12, 1950 DOA: 12/10/2013 PCP: Marco Collie, MD   Brief narrative: 63 year old BF PMHx  chronic kidney disease on dialysis (TTS), vascular dementia ,diabetes as well as hypertension, dry gangrene right great toe She presented to the emergency department at [facility] with a one-day history of chest pain. The pain was located in the central chest. This apparently was associated with a feeling of generalized malaise. Because of the symptoms the daughter had the patient not take her usual Norvasc dosage prior to arrival. The patient's daughter also postulated to the admitting physician that patient may have had too much fluid taken off with most recent dialysis treatment this past Saturday.  Upon evaluation at the [facility] emergency department patient was quite hypertensive with systolic blood pressure readings in the 220 range and an initial troponin level of 0.47. Patient's chest pain symptoms resolved after receiving oxygen and nitroglycerin. She was also given 20 mg of labetalol which dropped her systolic blood pressure to a more appropriate range in the 140-160 range. She was started on a heparin drip and continued on IV nitroglycerin and transferred to [facility].  [facility] arrival to [facility] she was evaluated by the cardiology fellow who was concern for possible non-STEMI. By the time she arrived her chest pain had resolved. Apparently this patient has had at least 2 separate trips to the ER for similar symptoms in the past few weeks. Based on her symptoms cardiologist on a.m. rounds on 9/15 that it would be reasonable to pursue diagnostic cardiac catheterization since her symptoms are concerning for angina. Plans are to coordinate this procedure with patient's usual hemodialysis schedule. Repeat troponin after arrival to [facility] was less than 0.30.   HPI/Subjective: No complaints verbalized  pre cath.  Appears comfortable.    Assessment/Plan:    Hypertensive emergency -Increased metoprolol to 100 mg BID on 9/17- cont Hydralazine IV 5 mg PRN SBP> 160 or DBP>100    NSTEMI/ 2V CAD Cardiology following-cardiac catheterization revealed LAD and high grade RCA stenosis; Cards planning PCI 9/18-continue IV NTG and Heparin, beta blocker, aspirin, and statin. Was on Plavix prior to admission-Cards plans PCI and stent to co dominant RCA lesion 9/18 (today)   Anemia ? Etilogy: hgb on 9/8 was 11.6 with nadir this admit of 7.2-pt unaware of GIB sx's.- Cards rec keep hgb >/= 10 with underlying CAD- PRBCs were given on 9/16 and now hgb stable- if guiac + will need GI eval while inpatient - Iron 119, UIBC less than 15 with TIBC and saturation rates not calculated, ferritin elevated at 988 with normal folate and vitamin B12-doubt iron overload since UIBC/TIBC low therefore elevated ferritin likely related to use of erythropoietin stimulating agents in setting of chronic kidney disease; could also be acute phase reactant in setting of acute illness/acute inflammatory process    CKD  stage V requiring chronic dialysis Nephrology following-usual dialysis days are T/Th/S    Leukocytosis/Abnormal urinalysis Urine culture with multiple contaminants-blood cultures with NGTD-no clear cut source for infection so no indication to add antibiotics at this time-no  Further fever and leukocytosis has resolved   Necrotic right great toe/  Peripheral vascular disease Previously evaluated by Dr. Oneida Alar with [facility]-underwent aortogram with bilateral lower extremity runoff and was found to have severe bilateral popliteal and tibial artery occlusive disease with and reconstruct double tibial artery occlusive disease. She was deemed not a candidate for bypass procedure  due to overall debility and nonambulatory status-also the type of disease she has would not be amenable to percutaneous treatment-focused to be on narcotic  pain control and if she develops nonhealing wounds or gangrenous changes she would need a primary amputation    Type II diabetes mellitus on insulin Current CBGs well controlled-continue Levemir and sliding scale insulin    Vascular dementia  DVT prophylaxis: Heparin infusion Code Status: Full Family Communication: No family at bedside Disposition Plan/Expected LOS: Step down  Consultants: Cardiology Nephrology  Procedures: Cardiac catheterization: LVEF= 50-55% with a small region of subtle mid-distal inferior hypocontractility.  -Predominant two-vessel coronary artery disease with 50-60% tubular stenosis involving the LAD ostium and proximal segment, with 80-90% mid LAD stenoses, 80% mid-distal LAD stenosis; 80% stenosis in a small branch of the OM 3 vessel; and high-grade RCA stenoses with 85% eccentric proximal, 95%, mid, and 85% mid distal stenoses.  Cultures 9/15 urine multiple bacterial morphotypes 9/15 blood right hand x2 NGTD   Antibiotics: None  Objective: Blood pressure 156/52, pulse 79, temperature 98.4 F (36.9 C), temperature source Oral, resp. rate 12, height 5\' 2"  (1.575 m), weight 114 lb 6.7 oz (51.9 kg), SpO2 100.00%.  Intake/Output Summary (Last 24 hours) at 12/13/13 1358 Last data filed at 12/13/13 1300  Gross per 24 hour  Intake 545.93 ml  Output    600 ml  Net -54.07 ml   Exam: Gen: No acute respiratory distress Chest: Clear to auscultation bilaterally, room air Cardiac: Regular rate and rhythm, S1-S2, no rubs murmurs or gallops, no peripheral edema, no JVD Abdomen: Soft nontender nondistended, no ascites Extremities: Symmetrical in appearance without cyanosis, clubbing or effusion. Right great toe dry gangrene present extending up slightly past the metatarsophalangeal joint.   Scheduled Meds:  Scheduled Meds: . amLODipine  10 mg Oral Daily  . antiseptic oral rinse  7 mL Mouth Rinse BID  . aspirin EC  81 mg Oral Daily  . atorvastatin  40 mg Oral  q1800  . calcitRIOL  0.25 mcg Oral Q T,Th,Sa-HD  . clopidogrel  75 mg Oral Q breakfast  . [START ON 12/16/2013] darbepoetin (ARANESP) injection - DIALYSIS  100 mcg Intravenous Q Mon-HD  . ferric gluconate (FERRLECIT/NULECIT) IV  62.5 mg Intravenous Weekly  . insulin aspart  0-9 Units Subcutaneous 6 times per day  . metoprolol tartrate  100 mg Oral BID  . multivitamin  1 tablet Oral QHS  . oxyCODONE  5 mg Oral TID  . sodium chloride  3 mL Intravenous Q12H   Continuous Infusions: . sodium chloride    . sodium chloride 30 mL/hr at 12/13/13 1225  . nitroGLYCERIN 20 mcg/min (12/13/13 1230)    Data Reviewed: Basic Metabolic Panel:  Recent Labs Lab 12/10/13 0607 12/11/13 0340 12/12/13 0452 12/13/13 0520  NA 139 133* 136* 136*  K 4.4 4.9 3.8 3.4*  CL 94* 92* 93* 90*  CO2 23 23 25  32  GLUCOSE 112* 70 109* 95  BUN 56* 65* 27* 42*  CREATININE 6.98* 7.86* 4.08* 5.73*  CALCIUM 8.7 8.7 8.7 8.8  PHOS  --   --   --  5.6*   Liver Function Tests:  Recent Labs Lab 12/13/13 0520  ALBUMIN 2.2*    CBC:  Recent Labs Lab 12/10/13 1700 12/11/13 0340 12/12/13 0452 12/13/13 0520 12/13/13 1256  WBC 9.5 9.3 13.5* 12.0* 9.9  HGB 7.4* 7.2* 10.5* 8.8* 8.6*  HCT 22.0* 21.1* 30.5* 25.9* 25.2*  MCV 82.1 82.4 83.1 84.6 86.9  PLT 417*  395 389 392 377   Cardiac Enzymes:  Recent Labs Lab 12/10/13 0607 12/10/13 1020 12/10/13 1700  TROPONINI <0.30 <0.30 <0.30   CBG:  Recent Labs Lab 12/12/13 2009 12/12/13 2315 12/13/13 0344 12/13/13 0748 12/13/13 1229  GLUCAP 151* 124* 109* 103* 113*    Recent Results (from the past 240 hour(s))  MRSA PCR SCREENING     Status: None   Collection Time    12/10/13  4:01 AM      Result Value Ref Range Status   MRSA by PCR NEGATIVE  NEGATIVE Final   Comment:            The GeneXpert MRSA Assay (FDA     approved for NASAL specimens     only), is one component of a     comprehensive MRSA colonization     surveillance program. It is not      intended to diagnose MRSA     infection nor to guide or     monitor treatment for     MRSA infections.  URINE CULTURE     Status: None   Collection Time    12/10/13  8:00 AM      Result Value Ref Range Status   Specimen Description URINE, RANDOM   Final   Special Requests add on if possible o/w obtain via I/O cath   Final   Culture  Setup Time     Final   Value: 12/11/2013 03:20     Performed at [facility] Count     Final   Value: >=100,000 COLONIES/ML     Performed at [facility]   Culture     Final   Value: Multiple bacterial morphotypes present, none predominant. Suggest appropriate recollection if clinically indicated.     Performed at [facility]   Report Status 12/12/2013 FINAL   Final  CULTURE, BLOOD (ROUTINE X 2)     Status: None   Collection Time    12/10/13 10:20 AM      Result Value Ref Range Status   Specimen Description BLOOD RIGHT HAND   Final   Special Requests BOTTLES DRAWN AEROBIC ONLY 5CC   Final   Culture  Setup Time     Final   Value: 12/10/2013 16:05     Performed at [facility]   Culture     Final   Value:        BLOOD CULTURE RECEIVED NO GROWTH TO DATE CULTURE WILL BE HELD FOR 5 DAYS BEFORE ISSUING A FINAL NEGATIVE REPORT     Performed at [facility]   Report Status PENDING   Incomplete  CULTURE, BLOOD (ROUTINE X 2)     Status: None   Collection Time    12/10/13 10:30 AM      Result Value Ref Range Status   Specimen Description BLOOD RIGHT HAND   Final   Special Requests BOTTLES DRAWN AEROBIC ONLY 3CC   Final   Culture  Setup Time     Final   Value: 12/10/2013 16:05     Performed at [facility]   Culture     Final   Value:        BLOOD CULTURE RECEIVED NO GROWTH TO DATE CULTURE WILL BE HELD FOR 5 DAYS BEFORE ISSUING A FINAL NEGATIVE REPORT     Performed at [facility]   Report Status PENDING   Incomplete     Studies:  Recent  x-ray studies have been reviewed in detail by  the Attending Physician  Time spent : [place], ANP [facility] Office  [phone] Pager [phone]   On-Call/Text Page:      [email or web address]      password [email or web address]  If 7PM-7AM, please contact night-coverage [email or web address] Password [email or web address] 12/13/2013, 1:58 PM   LOS: 3 days   I have personally examined this patient and reviewed the entire database. I have reviewed the above note, made any necessary editorial changes, and agree with its content.  Cherene Altes, MD [facility]

## 2013-12-13 NOTE — CV Procedure (Signed)
[name] is a 63 y.o. female    [id number]  [id number] LOCATION:  FACILITY: [place]  PHYSICIAN: Troy Sine, MD, [facility] Dec 28, 1950   DATE OF PROCEDURE:  12/13/2013    COMPLEX PERCUTANEOUS CORONARY INTERVENTION    HISTORY:    [name] is a 63 y.o. female with end-stage renal disease on dialysis, who underwent cardiac catheterization on 12/10/2013.  Please refer to the cardiac catheterization report.  She is now referred for high-speed rotational atherectomy of her severely diseased, calcified Right Coronary Artery, with plans for medical management to her concomitant CAD.   PROCEDURE:  Complex percutaneous coronary intervention with high-speed rotational atherectomy/PTCA/DES stenting involving the 3 sites in the right coronary artery, temporary transvenous pacemaker.  The patient was brought to the [facility] cardiac catherization labaratory in the fasting state. She was premedicated with Versed 1 mg and fentanyl 25 mcg initially, but during the procedure did receive additional dosing.  Her right groin was prepped and shaved in usual sterile fashion. Xylocaine 1% was used for local anesthesia. A 6 French sheath was inserted into the R femoral artery and 7 French venous sheath was inserted into the right femoral vein.  A temporary transvenous pacemaker was advanced to the RV apex.  There was excellent capture and pacing.  Initially, a 6 [place] LIMA catheter was used for the abnormal takeoff.  RCA, but this was inadequate, and ultimately was changed for a 6 [place] Williams right, with side holes catheter, which was able to engage the RCA.  As a max bolus plus infusion was administered.  Plavix 600 mg was given orally.  A Rotafloppy wire was advanced down the RCA and was able to cross all 3 lesions.  Initially, a 1.25 mm roto-length.  There was inserted and several runs were made in the RCA extending from the ostium to the acute margin to cover all 3 significant stenoses.  There did  appear to be still a moderate amount of calcified plaque.  A 1.5 mm bur was then inserted and 3 additional runs were made.  Lower-level balloon dilatation was done with a number [place] 2.75x20 mm balloon at all sites that had undergone rotablation.  A pro-water wire was inserted.  A Promus premier 2.5x38 mm DES stent was inserted and advanced distally and was dilated at 14 and 15 atmospheres.  A 2.75x28 mm Promus premier stent was then inserted extending ostially and overlapped with the proximal portion of the previously placed stent.  An [place] Emerge 3.0x20 mm balloon was used for post stent dilatation at all sites.  Scout angiography confirmed an excellent angiographic result.  The arterial sheath was sutured in place.  The temporary pacemaker was removed.  The venous sheath was sutured in place .  Plans for sheath removal 2 hours after bivalirudin administration.  HEMODYNAMICS:   Central Aorta: 96/40   ANGIOGRAPHY:  The) artery was significantly calcified with significant ostial calcification.  There was 90% proximal stenosis followed by 40% stenosis, 95% mid stenosis and followed by 90% stenosis proximal to the acute margin.  All sites underwent high-speed rotational atherectomy with ultimate PTCA, and DES stenting.  All sites were reduced to 0%, although there was mild 10% residual narrowing proximally, but there was optimal stent, up position.  The 2.75x28 mm stent was post dilated to 3.03 and the 2.5x38 mm stent was postdilated to 2.8-2.9 mm.  There was brisk TIMI-3 flow.  There was no evidence for dissection.   IMPRESSION:  Difficult but successful  complex percutaneous artery intervention with high-speed rotational atherectomy, PTCA, and DES stenting of 3 high grade lesions in the RCA with insertion of a 2.75x28 and 2.5x38 mm Promus Premier DES stents post dilated with stent taper from 3.03-2.8 mm.  Troy Sine, MD, [facility] 12/13/2013 8:23 PM

## 2013-12-13 NOTE — Progress Notes (Addendum)
Patient ID: [name], female   DOB: 16-Mar-1951, 63 y.o.   MRN: [id number]  [facility] Progress Note    Subjective:   No complaints, feels well   Objective:   BP 126/47  Pulse 77  Temp(Src) 98.4 F (36.9 C) (Oral)  Resp 12  Ht 5\' 2"  (1.575 m)  Wt 51.9 kg (114 lb 6.7 oz)  BMI 20.92 kg/m2  SpO2 100%  Intake/Output: I/O last 3 completed shifts: In: 995.2 [P.O.:450; I.V.:545.2] Out: 600 [Emesis/NG output:600]   Intake/Output this shift:  Total I/O In: 15.5 [I.V.:15.5] Out: -  Weight change:   Physical Exam: KPT:WSFKC elderly AAF in NAD CVS:no rub Resp:cta LEX:NTZGYF Ext:no edema, LUE AVG +T/B  Labs: BMET  Recent Labs Lab 12/10/13 0607 12/11/13 0340 12/12/13 0452 12/13/13 0520  NA 139 133* 136* 136*  K 4.4 4.9 3.8 3.4*  CL 94* 92* 93* 90*  CO2 23 23 25  32  GLUCOSE 112* 70 109* 95  BUN 56* 65* 27* 42*  CREATININE 6.98* 7.86* 4.08* 5.73*  ALBUMIN  --   --   --  2.2*  CALCIUM 8.7 8.7 8.7 8.8  PHOS  --   --   --  5.6*   CBC  Recent Labs Lab 12/10/13 1700 12/11/13 0340 12/12/13 0452 12/13/13 0520  WBC 9.5 9.3 13.5* 12.0*  HGB 7.4* 7.2* 10.5* 8.8*  HCT 22.0* 21.1* 30.5* 25.9*  MCV 82.1 82.4 83.1 84.6  PLT 417* 395 389 392    @IMGRELPRIORS @ Medications:    . amLODipine  10 mg Oral Daily  . antiseptic oral rinse  7 mL Mouth Rinse BID  . aspirin EC  81 mg Oral Daily  . atorvastatin  40 mg Oral q1800  . calcitRIOL  0.25 mcg Oral Q T,Th,Sa-HD  . clopidogrel  75 mg Oral Q breakfast  . [START ON 12/16/2013] darbepoetin (ARANESP) injection - DIALYSIS  100 mcg Intravenous Q Mon-HD  . ferric gluconate (FERRLECIT/NULECIT) IV  62.5 mg Intravenous Weekly  . insulin aspart  0-9 Units Subcutaneous 6 times per day  . metoprolol tartrate  100 mg Oral BID  . multivitamin  1 tablet Oral QHS  . oxyCODONE  5 mg Oral TID  . sodium chloride  3 mL Intravenous Q12H    Dialysis Orders: TTS @[facility]   3hr 45 min 52.5kgs 2K/2Ca 6000Heparin 160  350/1.5 L AVG  calcitriol 0.25 mcg Aranesp 25 q 2 weeks (last 9.1) Venofer 50 q wk  Profile 2  Assessment/Plan:  1. CAD/NSTEMI- Heparin and nitro drip. Cath revealed predominant two-vessel coronary artery disease with 50-60% tubular stenosis involving the LAD ostium and proximal segment, with 80-90% mid LAD stenoses, 80% mid-distal LAD stenosis; 80% stenosis in a small branch of the OM 3 vessel; and high-grade RCA stenoses with 85% eccentric proximal, 95%, mid, and 85% mid distal stenoses. Pt for possible intervention of RCA lesions 2. ESRD - TTS [place].  (off schedule and Below EDW here). Will cont to monitor and plan for HD either today after cath or tomorrow to get back on schedule depending upon her repeat CBC and clinical course after cath. 3. Hypertension/volume -171/133 left under edw, watch during HD, may need edw lowered if tolerats. Metop. Home norvasc. Only 1.3 L off Saturday.  4. Anemia - Hgb has fluctuated since admission- baseline 10-11. Started ESA 12/11/13 and had increase in Hgb yesterday following blood transfusion at dialysis.  Will plan to transfuse again given Hgb is <9 (given NSTEMI and severe CAD). Cont weekly  Fe.  5. Metabolic bone disease - Ca+ 8.8. Cont calcitriol and phoslo when diet advanced. Last phos 5.9 and PTH 345  6. Nutrition - NPO for cath. Renal vit.  7. DM- per primary  8.   Gustav Knueppel A 12/13/2013, 9:14 AM

## 2013-12-13 NOTE — H&P (View-Only) (Signed)
Subjective:  No CP/SOB  Objective:  Temp:  [98.1 F (36.7 C)-99.5 F (37.5 C)] 98.7 F (37.1 C) (09/17 0800) Pulse Rate:  [79-92] 91 (09/17 1003) Resp:  [15-22] 16 (09/17 0800) BP: (139-193)/(42-107) 166/52 mmHg (09/17 1003) SpO2:  [98 %-100 %] 98 % (09/17 0800) Weight change: -2 lb 6.8 oz (-1.1 kg)  Intake/Output from previous day: 09/16 0701 - 09/17 0700 In: 794.4 [P.O.:540; I.V.:254.4] Out: 1700   Intake/Output from this shift:    Physical Exam: General appearance: alert and no distress Neck: no adenopathy, no carotid bruit, no JVD, supple, symmetrical, trachea midline and thyroid not enlarged, symmetric, no tenderness/mass/nodules Lungs: clear to auscultation bilaterally Heart: regular rate and rhythm, S1, S2 normal, no murmur, click, rub or gallop Extremities: extremities normal, atraumatic, no cyanosis or edema  Lab Results: Results for orders placed during the hospital encounter of 12/10/13 (from the past 48 hour(s))  GLUCOSE, CAPILLARY     Status: Abnormal   Collection Time    12/10/13  1:03 PM      Result Value Ref Range   Glucose-Capillary 121 (*) 70 - 99 mg/dL   Comment 1 Capillary Sample    TROPONIN I     Status: None   Collection Time    12/10/13  5:00 PM      Result Value Ref Range   Troponin I <0.30  <0.30 ng/mL   Comment:            Due to the release kinetics of cTnI,     a negative result within the first hours     of the onset of symptoms does not rule out     myocardial infarction with certainty.     If myocardial infarction is still suspected,     repeat the test at appropriate intervals.  PROTIME-INR     Status: None   Collection Time    12/10/13  5:00 PM      Result Value Ref Range   Prothrombin Time 14.6  11.6 - 15.2 seconds   INR 1.14  0.00 - 1.49  CBC     Status: Abnormal   Collection Time    12/10/13  5:00 PM      Result Value Ref Range   WBC 9.5  4.0 - 10.5 K/uL   RBC 2.68 (*) 3.87 - 5.11 MIL/uL   Hemoglobin 7.4 (*)  12.0 - 15.0 g/dL   HCT 22.0 (*) 36.0 - 46.0 %   MCV 82.1  78.0 - 100.0 fL   MCH 27.6  26.0 - 34.0 pg   MCHC 33.6  30.0 - 36.0 g/dL   RDW 14.3  11.5 - 15.5 %   Platelets 417 (*) 150 - 400 K/uL  POCT ACTIVATED CLOTTING TIME     Status: None   Collection Time    12/10/13  5:28 PM      Result Value Ref Range   Activated Clotting Time 112    GLUCOSE, CAPILLARY     Status: Abnormal   Collection Time    12/10/13  5:49 PM      Result Value Ref Range   Glucose-Capillary 67 (*) 70 - 99 mg/dL  GLUCOSE, CAPILLARY     Status: Abnormal   Collection Time    12/10/13  9:48 PM      Result Value Ref Range   Glucose-Capillary 109 (*) 70 - 99 mg/dL  GLUCOSE, CAPILLARY     Status: Abnormal   Collection Time  12/11/13  1:08 AM      Result Value Ref Range   Glucose-Capillary 106 (*) 70 - 99 mg/dL  CBC     Status: Abnormal   Collection Time    12/11/13  3:40 AM      Result Value Ref Range   WBC 9.3  4.0 - 10.5 K/uL   RBC 2.56 (*) 3.87 - 5.11 MIL/uL   Hemoglobin 7.2 (*) 12.0 - 15.0 g/dL   HCT 21.1 (*) 36.0 - 46.0 %   MCV 82.4  78.0 - 100.0 fL   MCH 28.1  26.0 - 34.0 pg   MCHC 34.1  30.0 - 36.0 g/dL   RDW 14.4  11.5 - 15.5 %   Platelets 395  150 - 400 K/uL  BASIC METABOLIC PANEL     Status: Abnormal   Collection Time    12/11/13  3:40 AM      Result Value Ref Range   Sodium 133 (*) 137 - 147 mEq/L   Potassium 4.9  3.7 - 5.3 mEq/L   Chloride 92 (*) 96 - 112 mEq/L   CO2 23  19 - 32 mEq/L   Glucose, Bld 70  70 - 99 mg/dL   BUN 65 (*) 6 - 23 mg/dL   Creatinine, Ser 7.86 (*) 0.50 - 1.10 mg/dL   Calcium 8.7  8.4 - 10.5 mg/dL   GFR calc non Af Amer 5 (*) >90 mL/min   GFR calc Af Amer 6 (*) >90 mL/min   Comment: (NOTE)     The eGFR has been calculated using the CKD EPI equation.     This calculation has not been validated in all clinical situations.     eGFR's persistently <90 mL/min signify possible Chronic Kidney     Disease.   Anion gap 18 (*) 5 - 15  GLUCOSE, CAPILLARY     Status:  Abnormal   Collection Time    12/11/13  5:01 AM      Result Value Ref Range   Glucose-Capillary 58 (*) 70 - 99 mg/dL   Comment 1 Capillary Sample    GLUCOSE, CAPILLARY     Status: Abnormal   Collection Time    12/11/13  5:44 AM      Result Value Ref Range   Glucose-Capillary 113 (*) 70 - 99 mg/dL  TYPE AND SCREEN     Status: None   Collection Time    12/11/13  8:15 AM      Result Value Ref Range   ABO/RH(D) O POS     Antibody Screen NEG     Sample Expiration 12/14/2013     Unit Number [id number]     Blood Component Type RED CELLS,LR     Unit division 00     Status of Unit ISSUED,FINAL     Transfusion Status OK TO TRANSFUSE     Crossmatch Result Compatible     Unit Number [id number]     Blood Component Type RED CELLS,LR     Unit division 00     Status of Unit ISSUED,FINAL     Transfusion Status OK TO TRANSFUSE     Crossmatch Result Compatible    PREPARE RBC (CROSSMATCH)     Status: None   Collection Time    12/11/13  8:15 AM      Result Value Ref Range   Order Confirmation ORDER PROCESSED BY BLOOD BANK    ABO/RH     Status: None   Collection Time  12/11/13  8:15 AM      Result Value Ref Range   ABO/RH(D) O POS    VITAMIN B12     Status: None   Collection Time    12/11/13  8:30 AM      Result Value Ref Range   Vitamin B-12 752  211 - 911 pg/mL   Comment: Performed at [facility]     Status: None   Collection Time    12/11/13  8:30 AM      Result Value Ref Range   Folate 9.4     Comment: (NOTE)     Reference Ranges            Deficient:       0.4 - 3.3 ng/mL            Indeterminate:   3.4 - 5.4 ng/mL            Normal:              > 5.4 ng/mL     Performed at [facility] TIBC     Status: Abnormal   Collection Time    12/11/13  8:30 AM      Result Value Ref Range   Iron 119  42 - 135 ug/dL   TIBC NOT CALC  250 - 470 ug/dL   Comment: TIBC and %SAT were not calculated due to the UIBC being <15.   Saturation Ratios  NOT CALC  20 - 55 %   Comment: TIBC and %SAT were not calculated due to the UIBC being <15.   UIBC <15 (*) 125 - 400 ug/dL   Comment: Performed at [facility]     Status: Abnormal   Collection Time    12/11/13  8:30 AM      Result Value Ref Range   Ferritin 988 (*) 10 - 291 ng/mL   Comment: Performed at [facility]     Status: Abnormal   Collection Time    12/11/13  8:30 AM      Result Value Ref Range   Retic Ct Pct 2.2  0.4 - 3.1 %   RBC. 2.72 (*) 3.87 - 5.11 MIL/uL   Retic Count, Manual 59.8  19.0 - 186.0 K/uL  TSH     Status: None   Collection Time    12/11/13  8:30 AM      Result Value Ref Range   TSH 0.471  0.350 - 4.500 uIU/mL  HEPARIN LEVEL (UNFRACTIONATED)     Status: Abnormal   Collection Time    12/11/13  9:24 AM      Result Value Ref Range   Heparin Unfractionated <0.10 (*) 0.30 - 0.70 IU/mL   Comment:            IF HEPARIN RESULTS ARE BELOW     EXPECTED VALUES, AND PATIENT     DOSAGE HAS BEEN CONFIRMED,     SUGGEST FOLLOW UP TESTING     OF ANTITHROMBIN III LEVELS.     REPEATED TO VERIFY  HEPARIN LEVEL (UNFRACTIONATED)     Status: Abnormal   Collection Time    12/11/13 10:49 AM      Result Value Ref Range   Heparin Unfractionated 0.11 (*) 0.30 - 0.70 IU/mL   Comment:            IF HEPARIN RESULTS ARE BELOW     EXPECTED VALUES, AND  PATIENT     DOSAGE HAS BEEN CONFIRMED,     SUGGEST FOLLOW UP TESTING     OF ANTITHROMBIN III LEVELS.  GLUCOSE, CAPILLARY     Status: Abnormal   Collection Time    12/11/13 12:40 PM      Result Value Ref Range   Glucose-Capillary 62 (*) 70 - 99 mg/dL   Comment 1 Capillary Sample    GLUCOSE, CAPILLARY     Status: Abnormal   Collection Time    12/11/13  2:03 PM      Result Value Ref Range   Glucose-Capillary 42 (*) 70 - 99 mg/dL  GLUCOSE, CAPILLARY     Status: Abnormal   Collection Time    12/11/13  2:49 PM      Result Value Ref Range   Glucose-Capillary 164 (*) 70 - 99 mg/dL    GLUCOSE, CAPILLARY     Status: Abnormal   Collection Time    12/11/13  6:07 PM      Result Value Ref Range   Glucose-Capillary 154 (*) 70 - 99 mg/dL  GLUCOSE, CAPILLARY     Status: Abnormal   Collection Time    12/11/13  7:31 PM      Result Value Ref Range   Glucose-Capillary 164 (*) 70 - 99 mg/dL  HEPARIN LEVEL (UNFRACTIONATED)     Status: Abnormal   Collection Time    12/11/13 10:15 PM      Result Value Ref Range   Heparin Unfractionated 0.15 (*) 0.30 - 0.70 IU/mL   Comment:            IF HEPARIN RESULTS ARE BELOW     EXPECTED VALUES, AND PATIENT     DOSAGE HAS BEEN CONFIRMED,     SUGGEST FOLLOW UP TESTING     OF ANTITHROMBIN III LEVELS.  GLUCOSE, CAPILLARY     Status: None   Collection Time    12/11/13 11:41 PM      Result Value Ref Range   Glucose-Capillary 95  70 - 99 mg/dL  GLUCOSE, CAPILLARY     Status: Abnormal   Collection Time    12/12/13  4:31 AM      Result Value Ref Range   Glucose-Capillary 110 (*) 70 - 99 mg/dL   Comment 1 Capillary Sample    GLUCOSE, CAPILLARY     Status: Abnormal   Collection Time    12/12/13  4:48 AM      Result Value Ref Range   Glucose-Capillary 116 (*) 70 - 99 mg/dL   Comment 1 Venous Sample    CBC     Status: Abnormal   Collection Time    12/12/13  4:52 AM      Result Value Ref Range   WBC 13.5 (*) 4.0 - 10.5 K/uL   RBC 3.67 (*) 3.87 - 5.11 MIL/uL   Hemoglobin 10.5 (*) 12.0 - 15.0 g/dL   Comment: DELTA CHECK NOTED     POST TRANSFUSION SPECIMEN   HCT 30.5 (*) 36.0 - 46.0 %   MCV 83.1  78.0 - 100.0 fL   MCH 28.6  26.0 - 34.0 pg   MCHC 34.4  30.0 - 36.0 g/dL   RDW 14.5  11.5 - 15.5 %   Platelets 389  150 - 400 K/uL  BASIC METABOLIC PANEL     Status: Abnormal   Collection Time    12/12/13  4:52 AM      Result Value Ref Range   Sodium 136 (*)  137 - 147 mEq/L   Potassium 3.8  3.7 - 5.3 mEq/L   Comment: DELTA CHECK NOTED   Chloride 93 (*) 96 - 112 mEq/L   CO2 25  19 - 32 mEq/L   Glucose, Bld 109 (*) 70 - 99 mg/dL   BUN  27 (*) 6 - 23 mg/dL   Comment: DELTA CHECK NOTED   Creatinine, Ser 4.08 (*) 0.50 - 1.10 mg/dL   Comment: DIALYSIS   Calcium 8.7  8.4 - 10.5 mg/dL   GFR calc non Af Amer 11 (*) >90 mL/min   GFR calc Af Amer 13 (*) >90 mL/min   Comment: (NOTE)     The eGFR has been calculated using the CKD EPI equation.     This calculation has not been validated in all clinical situations.     eGFR's persistently <90 mL/min signify possible Chronic Kidney     Disease.   Anion gap 18 (*) 5 - 15  HEPARIN LEVEL (UNFRACTIONATED)     Status: Abnormal   Collection Time    12/12/13  8:02 AM      Result Value Ref Range   Heparin Unfractionated 0.24 (*) 0.30 - 0.70 IU/mL   Comment:            IF HEPARIN RESULTS ARE BELOW     EXPECTED VALUES, AND PATIENT     DOSAGE HAS BEEN CONFIRMED,     SUGGEST FOLLOW UP TESTING     OF ANTITHROMBIN III LEVELS.  GLUCOSE, CAPILLARY     Status: Abnormal   Collection Time    12/12/13  8:03 AM      Result Value Ref Range   Glucose-Capillary 128 (*) 70 - 99 mg/dL   Comment 1 Capillary Sample      Imaging: Imaging results have been reviewed  Assessment/Plan:   1. Active Problems: 2.   CKD (chronic kidney disease) stage V requiring chronic dialysis 3.   Type II or unspecified type diabetes mellitus without mention of complication, not stated as uncontrolled 4.   Vascular dementia 5.   Peripheral vascular disease, unspecified 6.   Hypertensive emergency 7.   NSTEMI (non-ST elevated myocardial infarction) 8.   Leukocytosis 9.   Abnormal urinalysis 10.   Malnutrition of moderate degree 11.   Chest pain 12.   Time Spent Directly with Patient:  15 minutes  Length of Stay:  LOS: 2 days   No CP on IV hep/ntg. 2VD by cath, diffusely diseased small caliber LAD, high grade dom RCA (prob culprit). On IV hep/ntg. Will prob require RCA intervention. HGB today 10.5. HD today.  Will review angiograms with colleagues.  Lorretta Harp 12/12/2013, 11:17 AM  N.B I have  reviewed angiograms with Drs Claiborne Billings and Julianne Handler and the consensus is to precede with HSRA, PCI and stent Co Dominant RCA (probably the culprit lesion) tomorrow.

## 2013-12-14 LAB — CBC
HCT: 27.3 % — ABNORMAL LOW (ref 36.0–46.0)
Hemoglobin: 9.2 g/dL — ABNORMAL LOW (ref 12.0–15.0)
MCH: 28.4 pg (ref 26.0–34.0)
MCHC: 33.7 g/dL (ref 30.0–36.0)
MCV: 84.3 fL (ref 78.0–100.0)
PLATELETS: 399 10*3/uL (ref 150–400)
RBC: 3.24 MIL/uL — AB (ref 3.87–5.11)
RDW: 14.5 % (ref 11.5–15.5)
WBC: 13.5 10*3/uL — AB (ref 4.0–10.5)

## 2013-12-14 LAB — GLUCOSE, CAPILLARY
GLUCOSE-CAPILLARY: 164 mg/dL — AB (ref 70–99)
GLUCOSE-CAPILLARY: 99 mg/dL (ref 70–99)
Glucose-Capillary: 136 mg/dL — ABNORMAL HIGH (ref 70–99)
Glucose-Capillary: 85 mg/dL (ref 70–99)
Glucose-Capillary: 125 mg/dL — ABNORMAL HIGH (ref 70–99)
Glucose-Capillary: 140 mg/dL — ABNORMAL HIGH (ref 70–99)
Glucose-Capillary: 160 mg/dL — ABNORMAL HIGH (ref 70–99)

## 2013-12-14 LAB — RENAL FUNCTION PANEL
ALBUMIN: 2.2 g/dL — AB (ref 3.5–5.2)
ANION GAP: 12 (ref 5–15)
BUN: 14 mg/dL (ref 6–23)
CO2: 27 meq/L (ref 19–32)
Calcium: 8.3 mg/dL — ABNORMAL LOW (ref 8.4–10.5)
Chloride: 96 mEq/L (ref 96–112)
Creatinine, Ser: 2.66 mg/dL — ABNORMAL HIGH (ref 0.50–1.10)
GFR calc Af Amer: 21 mL/min — ABNORMAL LOW (ref >90)
GFR, EST NON AFRICAN AMERICAN: 18 mL/min — AB (ref >90)
Glucose, Bld: 122 mg/dL — ABNORMAL HIGH (ref 70–99)
POTASSIUM: 3.9 meq/L (ref 3.7–5.3)
Phosphorus: 2.5 mg/dL (ref 2.3–4.6)
Sodium: 135 mEq/L — ABNORMAL LOW (ref 137–147)

## 2013-12-14 MED ORDER — INSULIN ASPART 100 UNIT/ML ~~LOC~~ SOLN
0.0000 [IU] | Freq: Three times a day (TID) | SUBCUTANEOUS | Status: DC
  Administered 2013-12-14 (×2): 1 [IU] via SUBCUTANEOUS
  Administered 2013-12-15: 2 [IU] via SUBCUTANEOUS

## 2013-12-14 MED ORDER — HEPARIN SODIUM (PORCINE) 5000 UNIT/ML IJ SOLN
5000.0000 [IU] | Freq: Three times a day (TID) | INTRAMUSCULAR | Status: DC
  Administered 2013-12-14 – 2013-12-17 (×8): 5000 [IU] via SUBCUTANEOUS
  Filled 2013-12-14 (×9): qty 1

## 2013-12-14 MED ORDER — OXYCODONE HCL 5 MG PO TABS
5.0000 mg | ORAL_TABLET | Freq: Once | ORAL | Status: AC
  Administered 2013-12-14: 5 mg via ORAL
  Filled 2013-12-14: qty 1

## 2013-12-14 MED ORDER — ISOSORBIDE MONONITRATE ER 60 MG PO TB24
60.0000 mg | ORAL_TABLET | Freq: Every day | ORAL | Status: DC
  Administered 2013-12-14 – 2013-12-16 (×3): 60 mg via ORAL
  Filled 2013-12-14 (×5): qty 1

## 2013-12-14 NOTE — Progress Notes (Signed)
Subjective:   No complaints, no further chest pain or dyspnea  Objective: Vital signs in last 24 hours: Temp:  [97.8 F (36.6 C)-99 F (37.2 C)] 98.4 F (36.9 C) (09/19 1204) Pulse Rate:  [73-116] 99 (09/19 0800) Resp:  [4-30] 11 (09/19 1204) BP: (102-201)/(35-92) 121/53 mmHg (09/19 1200) SpO2:  [94 %-100 %] 100 % (09/19 1204) Weight:  [53.3 kg (117 lb 8.1 oz)] 53.3 kg (117 lb 8.1 oz) (09/18 1845) Weight change:   Intake/Output from previous day: 09/18 0701 - 09/19 0700 In: 643.4 [I.V.:643.4] Out: 2000  Intake/Output this shift: Total I/O In: 132 [P.O.:120; I.V.:12] Out: -   Lab Results:  Recent Labs  12/13/13 1256 12/13/13 1815 12/14/13 0252  WBC 9.9  --  13.5*  HGB 8.6*  --  9.2*  HCT 25.2*  --  27.3*  PLT 377 380 399   BMET:  Recent Labs  12/13/13 0520 12/14/13 0458  NA 136* 135*  K 3.4* 3.9  CL 90* 96  CO2 32 27  GLUCOSE 95 122*  BUN 42* 14  CREATININE 5.73* 2.66*  CALCIUM 8.8 8.3*  ALBUMIN 2.2* 2.2*   No results found for this basename: PTH,  in the last 72 hours Iron Studies: No results found for this basename: IRON, TIBC, TRANSFERRIN, FERRITIN,  in the last 72 hours  Studies/Results: No results found.  EXAM: General appearance:  Alert, in no apparent distress Resp:  CTA without rales, rhonchi, or wheezes Cardio:  RRR without murmur or rub GI:   + BS, soft and nontender  Extremities:  No edema Access: AVG @ LUA with + bruit  Dialysis Orders: TTS @[facility]   3hr 45 min 52.5kgs 2K/2Ca 6000Heparin 160 350/1.5 L AVG  calcitriol 0.25 mcg Aranesp 25 q 2 weeks (last 9.1) Venofer 50 q wk  Profile 2   Assessment/Plan: 1. CAD/NSTEMI - s/p complex PCI with high-speed rotational atherectomy, PTCA, and DES of 3 high grade lesions in RCA yesterday, no further pain, on Plavix & ASA per Cardiology. 2. ESRD - HD on TTS @ [facility], K 3.9.  Next HD likely 9/21, off schedule. 3. HTN/Volume - BP 121/53 on Amlodipine 10 mg qd, Metoprolol 100 mg bid; wt 53.3 kg s/p net  UF 2 L yesterday. 4. Anemia - Hgb 9.2 s/p 2 U PRBCs 9/16, Aranesp 100 mcg on Mon, weekly Fe. 5. Sec HPT - Ca 8.3 (9.7 corrected), P 2.5; Calcitriol 0.25 mcg on TTS, no binders. 6. Nutrition - Alb 2.2, carb-mod renal diet, multivitamin. 7. DM - per primary. 8. Ischemic right great toe- no change   LOS: 4 days   LYLES,CHARLES 12/14/2013,1:36 PM   I have seen and examined this patient and agree with plan as outlined by C. Lyles, PA-C. Passion Lavin A,MD 12/14/2013 2:56 PM

## 2013-12-14 NOTE — Progress Notes (Addendum)
[name]  [name] GYK:[id number] DOB: Nov 12, 1950 DOA: 12/10/2013 PCP: Marco Collie, MD  Brief narrative: 63yo F w/ Hx chronic kidney disease on dialysis (TTS), vascular dementia ,diabetes, hypertension, and dry gangrene right great toe who presented to the ED at [facility] with a one-day history of central chest pain.    Upon evaluation at the [facility] ED patient was quite hypertensive with systolic blood pressure readings in the 220 range and an initial troponin level of 0.47. Patient's chest pain symptoms resolved after receiving oxygen and nitroglycerin. She was given 20 mg of labetalol which dropped her systolic blood pressure to a more appropriate range (140-160). She was started on a heparin drip, continued on IV nitroglycerin, and transferred to [facility].   After arrival at [facility] she was evaluated by the Cardiology who was concerned for possible non-STEMI.  She was taken to cardiac cath on 9/15 which revealed two-vessel coronary artery disease with 50-60% tubular stenosis involving the LAD ostium and proximal segment, with 80-90% mid LAD stenoses, 80% mid-distal LAD stenosis; 80% stenosis in a small branch of the OM 3 vessel; and high-grade RCA stenoses with 85% eccentric proximal, 95%, mid, and 85% mid distal stenoses.  After further planning, the pt was taken back to the lab on 9/18 for complex percutaneous artery intervention with high-speed rotational atherectomy, PTCA, and DES stenting of 3 high grade lesions in the RCA with insertion of a 2.75x28 and 2.5x38 mm Promus Premier DES stents post dilated with stent taper from 3.03-2.8 mm.  HPI/Subjective: No complaints.  Appears comfortable.    Assessment/Plan:  Hypertensive emergency BP now much better controlled - no change in tx plan today   NSTEMI / 2V CAD Ongoing care per Cardiology - cardiac catheterization revealed LAD and high grade RCA stenosis - now s/p difficult  PTCA and DES placement in RCA lesions   Anemia Hgb on 9/8 was 11.6 with nadir this admit of 7.2 - no evidence of GIB - PRBCs were given on 9/16 (Cards wanted Hgb 10 or > around time of cath) - stool guaiacs ordered but not yet done - Fe studies suggestive of malnutrition, with very low unsaturated Fe binding capacity - on aranesp per Nephrology, as well as weekly IV Fe  CKD  stage V requiring chronic dialysis Nephrology following - usual dialysis days are T/Th/S  Leukocytosis/Abnormal urinalysis Urine culture with multiple contaminants - blood cultures with NGTD - no clear cut source for infection so no indication to add antibiotics at this time - no fever  Necrotic right great toe/  Peripheral vascular disease Previously evaluated by Dr. 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 - she was deemed not a candidate for bypass procedure due to overall debility and nonambulatory status - focus to be on pain control - if she develops nonhealing wounds or gangrenous changes she would need a primary amputation - no evidence of pain on exam   Type II diabetes mellitus on insulin Current CBGs well controlled - continue Levemir and sliding scale insulin  Vascular dementia  DVT prophylaxis: SQ heparin  Code Status: Full  Family Communication: No family at bedside Disposition Plan/Expected LOS: appears stable for transfer to renal floor    Consultants: Cardiology Nephrology  Procedures:  9/15 - Cardiac catheterization: LVEF= 50-55% with a small region of subtle mid-distal inferior hypocontractility.  -  Predominant two-vessel coronary artery disease with 50-60% tubular stenosis involving the LAD ostium and proximal segment, with 80-90% mid LAD stenoses, 80% mid-distal LAD stenosis; 80% stenosis in a small branch of the OM 3 vessel; and high-grade RCA stenoses with 85% eccentric  proximal, 95%, mid, and 85% mid distal stenoses.  9/18 - Cardiac cath - complex percutaneous artery intervention with high-speed rotational atherectomy, PTCA, and DES stenting of 3 high grade lesions in the RCA with insertion of a 2.75x28 and 2.5x38 mm Promus Premier DES stents post dilated with stent taper from 3.03-2.8 mm.  Antibiotics: None  Objective: Blood pressure 147/49, pulse 85, temperature 98.2 F (36.8 C), temperature source Oral, resp. rate 18, height 5\' 2"  (1.575 m), weight 53.3 kg (117 lb 8.1 oz), SpO2 98.00%.  Intake/Output Summary (Last 24 hours) at 12/14/13 0951 Last data filed at 12/14/13 0900  Gross per 24 hour  Intake  617.5 ml  Output   2000 ml  Net -1382.5 ml   Exam: Gen: No acute respiratory distress Chest: Clear to auscultation bilaterally, room air Cardiac: Regular rate and rhythm, no rubs murmurs or gallops, no peripheral edema Abdomen: Soft nontender nondistended, no ascites - bs+ Extremities: no signif c/c/e B LE - R great toe dry gangrene stable in appearance   Scheduled Meds:  Scheduled Meds: . amLODipine  10 mg Oral Daily  . antiseptic oral rinse  7 mL Mouth Rinse BID  . aspirin EC  81 mg Oral Daily  . atorvastatin  40 mg Oral q1800  . calcitRIOL  0.25 mcg Oral Q T,Th,Sa-HD  . clopidogrel  75 mg Oral Q breakfast  . [START ON 12/16/2013] darbepoetin (ARANESP) injection - DIALYSIS  100 mcg Intravenous Q Mon-HD  . ferric gluconate (FERRLECIT/NULECIT) IV  62.5 mg Intravenous Weekly  . insulin aspart  0-9 Units Subcutaneous 6 times per day  . metoprolol tartrate  100 mg Oral BID  . multivitamin  1 tablet Oral QHS  . oxyCODONE  5 mg Oral TID  . sodium chloride  3 mL Intravenous Q12H   Continuous Infusions: . sodium chloride    . sodium chloride 30 mL/hr at 12/13/13 1225  . nitroGLYCERIN 10 mcg/min (12/13/13 1700)    Data Reviewed: Basic Metabolic Panel:  Recent Labs Lab 12/10/13 0607 12/11/13 0340 12/12/13 0452 12/13/13 0520  12/14/13 0458  NA 139 133* 136* 136* 135*  K 4.4 4.9 3.8 3.4* 3.9  CL 94* 92* 93* 90* 96  CO2 23 23 25  32 27  GLUCOSE 112* 70 109* 95 122*  BUN 56* 65* 27* 42* 14  CREATININE 6.98* 7.86* 4.08* 5.73* 2.66*  CALCIUM 8.7 8.7 8.7 8.8 8.3*  PHOS  --   --   --  5.6* 2.5   Liver Function Tests:  Recent Labs Lab 12/13/13 0520 12/14/13 0458  ALBUMIN 2.2* 2.2*   CBC:  Recent Labs Lab 12/11/13 0340 12/12/13 0452 12/13/13 0520 12/13/13 1256 12/13/13 1815 12/14/13 0252  WBC 9.3 13.5* 12.0* 9.9  --  13.5*  HGB 7.2* 10.5* 8.8* 8.6*  --  9.2*  HCT 21.1* 30.5* 25.9* 25.2*  --  27.3*  MCV 82.4 83.1 84.6 86.9  --  84.3  PLT 395 389 392 377 380 399   Cardiac Enzymes:  Recent Labs Lab 12/10/13 0607 12/10/13 1020 12/10/13 1700  TROPONINI <0.30 <0.30 <0.30   CBG:  Recent Labs Lab 12/13/13 1622 12/13/13 2036 12/14/13 0018 12/14/13 0348 12/14/13 0805  GLUCAP 215* 113* 99 136* 85    Recent  Results (from the past 240 hour(s))  MRSA PCR SCREENING     Status: None   Collection Time    12/10/13  4:01 AM      Result Value Ref Range Status   MRSA by PCR NEGATIVE  NEGATIVE Final   Comment:            The GeneXpert MRSA Assay (FDA     approved for NASAL specimens     only), is one component of a     comprehensive MRSA colonization     surveillance program. It is not     intended to diagnose MRSA     infection nor to guide or     monitor treatment for     MRSA infections.  URINE CULTURE     Status: None   Collection Time    12/10/13  8:00 AM      Result Value Ref Range Status   Specimen Description URINE, RANDOM   Final   Special Requests add on if possible o/w obtain via I/O cath   Final   Culture  Setup Time     Final   Value: 12/11/2013 03:20     Performed at [facility] Count     Final   Value: >=100,000 COLONIES/ML     Performed at [facility]   Culture     Final   Value: Multiple bacterial morphotypes present, none predominant.  Suggest appropriate recollection if clinically indicated.     Performed at [facility]   Report Status 12/12/2013 FINAL   Final  CULTURE, BLOOD (ROUTINE X 2)     Status: None   Collection Time    12/10/13 10:20 AM      Result Value Ref Range Status   Specimen Description BLOOD RIGHT HAND   Final   Special Requests BOTTLES DRAWN AEROBIC ONLY 5CC   Final   Culture  Setup Time     Final   Value: 12/10/2013 16:05     Performed at [facility]   Culture     Final   Value:        BLOOD CULTURE RECEIVED NO GROWTH TO DATE CULTURE WILL BE HELD FOR 5 DAYS BEFORE ISSUING A FINAL NEGATIVE REPORT     Performed at [facility]   Report Status PENDING   Incomplete  CULTURE, BLOOD (ROUTINE X 2)     Status: None   Collection Time    12/10/13 10:30 AM      Result Value Ref Range Status   Specimen Description BLOOD RIGHT HAND   Final   Special Requests BOTTLES DRAWN AEROBIC ONLY 3CC   Final   Culture  Setup Time     Final   Value: 12/10/2013 16:05     Performed at [facility]   Culture     Final   Value:        BLOOD CULTURE RECEIVED NO GROWTH TO DATE CULTURE WILL BE HELD FOR 5 DAYS BEFORE ISSUING A FINAL NEGATIVE REPORT     Performed at [facility]   Report Status PENDING   Incomplete     Studies:  Recent x-ray studies have been reviewed in detail by the Attending Physician  Time spent : 21 mins  Cherene Altes, MD [facility] For Consults/Admissions - Flow Manager - [phone] Office  [phone] Pager [phone]  On-Call/Text Page:      [email or web address]      password  [email or web address]   12/14/2013, 9:51 AM   LOS: 4 days

## 2013-12-14 NOTE — Progress Notes (Signed)
After discussing patient with her nurse, Thayer Headings patient uses a wheelchair at home and does not walk.  She also has a necrotic toe.  Not appropriate for ambulation.  Due to patient's dementia and daughter not presentI was not able to do any education at this time.  Nurse not sure of discharge date.

## 2013-12-14 NOTE — Progress Notes (Addendum)
CARDIOLOGY CONSULT NOTE  Patient ID: [name], MRN: [id number], DOB/AGE: 08-03-1950 63 y.o. Admit date: 12/10/2013 Date of Consult: 12/14/2013  Primary Physician: Marco Collie, MD Primary Cardiologist:  Gwenlyn Found  Chief Complaint: chest pain   HPI: 63 y.o. female w/ PMHx significant for ESRD on dialysis, HTN, DM2, PVD with ulcerated R toe  who presented initially to [facility] with chest pain and transferred  [facility] on 12/14/2013 after labs returned concerning for myocardial damage.  Patient reports earlier this week feeling poorly and was seen in the ED at [facility] on 9/8 with nausea, vomiting and weakness. No troponins or EKG done. Today while at home resting, sudden onset of substernal chest pain that radiated to her neck and abdomen. Never had symptoms like this before. Resolved when oxygen was administered by EMS.  At [facility], her BP was in the 190s to 200s and labetolol was administered. Aspirin was given. Heparin gtt and nitro gtt started after consultation to transfer.  She had a complex rotoblator / stent placement to the RCA.  She has moderate disease of the LAD.     Past Medical History  Diagnosis Date  . Diabetes mellitus   . Hypertension   . CHF (congestive heart failure)   . Hyperlipidemia   . End-stage renal disease on hemodialysis     Started dialysis in late 2014. Gets HD in [place] on TTS schedule.     . Thyroid disease   . Depression   . Shortness of breath     at night  . Arthritis   . Dementia   . Stroke     2013, weakness- all over  . Constipation   . Vision disturbance   . End stage renal disease 03/30/2011  . Coronary artery disease       Surgical History:  Past Surgical History  Procedure Laterality Date  . Arteriovenous graft placement      left upper extremity  . Tubal ligation      x2  . Av fistula placement Left 07/02/2012    Procedure: ARTERIOVENOUS (AV) FISTULA CREATION;  Surgeon: Conrad Riverside, MD;  Location: [place];  Service:  Vascular;  Laterality: Left;  . Av fistula placement Left 10/01/2012    Procedure: INSERTION OF ARTERIOVENOUS (AV) GORE-TEX GRAFT LEFT UPPER ARM;  Surgeon: Conrad [place], MD;  Location: [place];  Service: Vascular;  Laterality: Left;  Ultrasound guided     Home Meds: Prior to Admission medications   Medication Sig Start Date End Date Taking? Authorizing Provider  amLODipine (NORVASC) 10 MG tablet Take 1 tablet (10 mg total) by mouth daily. 06/07/13   Kelvin Cellar, MD  clopidogrel (PLAVIX) 75 MG tablet Take 1 tablet (75 mg total) by mouth daily with breakfast. 09/19/13   Elam Dutch, MD  Insulin Detemir (LEVEMIR FLEXPEN) 100 UNIT/ML Pen Inject 14 Units into the skin daily at 10 pm. 05/27/13   Velta Addison Mikhail, DO  multivitamin (RENA-VIT) TABS tablet Take 1 tablet by mouth at bedtime. 05/24/13   Maryann Mikhail, DO  oxycodone (OXY-IR) 5 MG capsule Take 1 capsule (5 mg total) by mouth 3 (three) times daily. 11/04/13   Serafina Mitchell, MD  simvastatin (ZOCOR) 20 MG tablet Take 1 tablet (20 mg total) by mouth daily at 6 PM. 06/05/13   Debbe Odea, MD  ZOFRAN 4 MG tablet Take 4 mg by mouth every 8 (eight) hours as needed for nausea or vomiting.  06/25/13   Historical Provider, MD  Inpatient Medications:  . amLODipine  10 mg Oral Daily  . antiseptic oral rinse  7 mL Mouth Rinse BID  . aspirin EC  81 mg Oral Daily  . atorvastatin  40 mg Oral q1800  . calcitRIOL  0.25 mcg Oral Q T,Th,Sa-HD  . clopidogrel  75 mg Oral Q breakfast  . [START ON 12/16/2013] darbepoetin (ARANESP) injection - DIALYSIS  100 mcg Intravenous Q Mon-HD  . ferric gluconate (FERRLECIT/NULECIT) IV  62.5 mg Intravenous Weekly  . heparin subcutaneous  5,000 Units Subcutaneous 3 times per day  . insulin aspart  0-9 Units Subcutaneous TID WC  . metoprolol tartrate  100 mg Oral BID  . multivitamin  1 tablet Oral QHS  . oxyCODONE  5 mg Oral TID  . sodium chloride  3 mL Intravenous Q12H   . sodium chloride    . nitroGLYCERIN Stopped  (12/14/13 1000)    Allergies:  Allergies  Allergen Reactions  . Phenergan [Promethazine Hcl]     unknown    History   Social History  . Marital Status: Divorced    Spouse Name: N/A    Number of Children: N/A  . Years of Education: N/A   Occupational History  . Not on file.   Social History Main Topics  . Smoking status: Never Smoker   . Smokeless tobacco: Never Used  . Alcohol Use: No  . Drug Use: No  . Sexual Activity: Not on file   Other Topics Concern  . Not on file   Social History Narrative  . No narrative on file     Family History  Problem Relation Age of Onset  . Diabetes Father   . Stroke Father   . Heart disease Mother      Labs: No results found for this basename: CKTOTAL, CKMB, TROPONINI,  in the last 72 hours Lab Results  Component Value Date   WBC 13.5* 12/14/2013   HGB 9.2* 12/14/2013   HCT 27.3* 12/14/2013   MCV 84.3 12/14/2013   PLT 399 12/14/2013     Recent Labs Lab 12/14/13 0458  NA 135*  K 3.9  CL 96  CO2 27  BUN 14  CREATININE 2.66*  CALCIUM 8.3*  GLUCOSE 122*   Radiology/Studies:  Dg Chest 2 View   Physical Exam: Blood pressure 147/49, pulse 85, temperature 98.2 F (36.8 C), temperature source Oral, resp. rate 18, height 5\' 2"  (1.575 m), weight 117 lb 8.1 oz (53.3 kg), SpO2 98.00%. General: in no acute distress.   Head: Normocephalic, atraumatic, sclera non-icteric, no xanthomas, nares are without discharge.  Neck:  JVD not elevated. Lungs: Clear bilaterally to auscultation without wheezes, rales, or rhonchi. Breathing is unlabored. Heart: RRR with S1 S2. 1/6 SEM Abdomen: Soft, non-tender, non-distended with normoactive bowel sounds. No hepatomegaly. No rebound/guarding. No obvious abdominal masses. Msk:  Strength and tone appear normal for age. Extremities: No clubbing or cyanosis. No edema.  Ext warm. R big toe with dry ulceration. fistual in left arm Neuro: Alert and oriented X 3. Moves all extremities  spontaneously. Psych:  Responds to questions appropriately with a normal affect. Questionable recall   Assessment and Plan:   1. CAD:  She presented with NSTEMI.   S/p stenting of her RCA - was very diffusely disease and required rotoblator prior to stenting.  She has a moderate tubular stenosis in the LAD that will be treated medically.  On plavix and asa.  I would suggest lifelong Plavix and asa unless she develops contraindications.  Currently , she is pain free.   2. ESRD:  Per medicine team and nephrology.  3. PVD:   May need PV evaluation  4. HTN:  BP is ok  She is stable from a cardiac standpoint. Will sign off. She may follow up with Dr. Gwenlyn Found.   Thayer Headings, Brooke Bonito., MD, [facility] 12/14/2013, 10:49 AM [address]  [place] Pager [phone]

## 2013-12-15 LAB — GLUCOSE, CAPILLARY
GLUCOSE-CAPILLARY: 107 mg/dL — AB (ref 70–99)
GLUCOSE-CAPILLARY: 151 mg/dL — AB (ref 70–99)
Glucose-Capillary: 110 mg/dL — ABNORMAL HIGH (ref 70–99)
Glucose-Capillary: 139 mg/dL — ABNORMAL HIGH (ref 70–99)

## 2013-12-15 LAB — CBC
HCT: 24 % — ABNORMAL LOW (ref 36.0–46.0)
HEMOGLOBIN: 8.3 g/dL — AB (ref 12.0–15.0)
MCH: 29.3 pg (ref 26.0–34.0)
MCHC: 34.6 g/dL (ref 30.0–36.0)
MCV: 84.8 fL (ref 78.0–100.0)
Platelets: 422 10*3/uL — ABNORMAL HIGH (ref 150–400)
RBC: 2.83 MIL/uL — AB (ref 3.87–5.11)
RDW: 14.5 % (ref 11.5–15.5)
WBC: 13.8 10*3/uL — ABNORMAL HIGH (ref 4.0–10.5)

## 2013-12-15 MED ORDER — OXYCODONE HCL 5 MG PO TABS
10.0000 mg | ORAL_TABLET | Freq: Three times a day (TID) | ORAL | Status: DC
  Administered 2013-12-15 – 2013-12-17 (×5): 10 mg via ORAL
  Filled 2013-12-15 (×6): qty 2

## 2013-12-15 MED ORDER — OXYCODONE HCL 5 MG PO TABS
5.0000 mg | ORAL_TABLET | Freq: Once | ORAL | Status: AC
  Administered 2013-12-15: 5 mg via ORAL
  Filled 2013-12-15: qty 1

## 2013-12-15 MED ORDER — SODIUM CHLORIDE 0.9 % IV SOLN: INTRAVENOUS | Status: AC

## 2013-12-15 NOTE — Progress Notes (Signed)
Subjective:  Complains of right big toe pain, with recent nail removal in [place], no further chest pain or dyspnea  Objective: Vital signs in last 24 hours: Temp:  [98.4 F (36.9 C)-99.2 F (37.3 C)] 98.4 F (36.9 C) (09/20 0415) Pulse Rate:  [85-122] 85 (09/20 0415) Resp:  [4-26] 17 (09/20 0415) BP: (121-197)/(41-87) 122/49 mmHg (09/20 0415) SpO2:  [97 %-100 %] 98 % (09/20 0415) Weight:  [51.801 kg (114 lb 3.2 oz)] 51.801 kg (114 lb 3.2 oz) (09/19 2236) Weight change: -1.499 kg (-3 lb 4.9 oz)  Intake/Output from previous day: 09/19 0701 - 09/20 0700 In: 192 [P.O.:180; I.V.:12] Out: -  Intake/Output this shift:   Lab Results:  Recent Labs  12/13/13 1256 12/13/13 1815 12/14/13 0252  WBC 9.9  --  13.5*  HGB 8.6*  --  9.2*  HCT 25.2*  --  27.3*  PLT 377 380 399   BMET:  Recent Labs  12/13/13 0520 12/14/13 0458  NA 136* 135*  K 3.4* 3.9  CL 90* 96  CO2 32 27  GLUCOSE 95 122*  BUN 42* 14  CREATININE 5.73* 2.66*  CALCIUM 8.8 8.3*  ALBUMIN 2.2* 2.2*   No results found for this basename: PTH,  in the last 72 hours Iron Studies: No results found for this basename: IRON, TIBC, TRANSFERRIN, FERRITIN,  in the last 72 hours  Studies/Results: No results found.  EXAM:  General appearance: Alert, in no apparent distress  Resp: CTA without rales, rhonchi, or wheezes  Cardio: RRR without murmur or rub  GI: + BS, soft and nontender  Extremities: No edema, R big toe without nail, surrounding darkened area  Access: AVG @ LUA with + bruit   Dialysis Orders: TTS @[facility]   3hr 45 min 52.5kgs 2K/2Ca 6000Heparin 160 350/1.5 L AVG  calcitriol 0.25 mcg Aranesp 25 q 2 weeks (last 9.1) Venofer 50 q wk  Profile 2   Assessment/Plan: 1. CAD/NSTEMI - s/p complex PCI with high-speed rotational atherectomy, PTCA, and DES of 3 high grade lesions in RCA yesterday, no further pain, on Plavix & ASA per Cardiology.  2. ESRD - HD on TTS @ [facility], K 3.9. Next HD tomorrow, off  schedule. 3. HTN/Volume - BP 122/49 on Amlodipine 10 mg qd, Metoprolol 100 mg bid; wt 51.8 kg, below EDW.  4. Anemia - Hgb 9.2 s/p 2 U PRBCs 9/16, Aranesp 100 mcg on Mon, weekly Fe.  5. Sec HPT - Ca 8.3 (9.7 corrected), P 2.5; Calcitriol 0.25 mcg on TTS, no binders.  6. Nutrition - Alb 2.2, carb-mod renal diet, multivitamin.  7. DM - per primary.  8. Ischemic right great toe- no change   LOS: 5 days   LYLES,CHARLES 12/15/2013,8:27 AM   I have seen and examined this patient and agree with plan as outlined by C. Rick Duff A,MD 12/15/2013 11:24 AM

## 2013-12-15 NOTE — Evaluation (Signed)
Physical Therapy Evaluation Patient Details Name: [name] MRN: [id number] DOB: Sep 09, 1950 Today's Date: 12/15/2013   History of Present Illness  63yo F w/ Hx chronic kidney disease on dialysis (TTS), vascular dementia ,diabetes, hypertension, and dry gangrene right great toe who presented to the ED at [facility] with a one-day history of central chest pain; After arrival at [facility] she was evaluated by the Cardiology who was concerned for possible non-STEMI. She was taken to cardiac cath on 9/15 which revealed two-vessel coronary artery disease with 50-60% tubular stenosis involving the LAD ostium and proximal segment, with 80-90% mid LAD stenoses, 80% mid-distal LAD stenosis; 80% stenosis in a small branch of the OM 3 vessel; and high-grade RCA stenoses with 85% eccentric proximal, 95%, mid, and 85% mid distal stenoses. After further planning, the pt was taken back to the lab on 9/18 for complex percutaneous artery intervention with high-speed rotational atherectomy, PTCA, and DES stenting of 3 high grade lesions in the RCA with insertion of a 2.75x28 and 2.5x38 mm Promus Premier DES stents post dilated with stent taper from 3.03-2.8 mm.  Clinical Impression   Pt admitted with above. Pt currently with functional limitations due to the deficits listed below (see PT Problem List).  Pt will benefit from skilled PT to increase their independence and safety with mobility to allow discharge to the venue listed below.   Will need a clearer picture of how much reliable assist is available to [name] at home; if 24 hour assist is available, I would favor dc home with HHPT/OT/RN follow up  If she doesn't have 24 hour assist, we must consider SNF      Follow Up Recommendations Supervision/Assistance - 24 hour;Home health PT    Equipment Recommendations  None recommended by PT    Recommendations for Other Services OT consult (as ordered)     Precautions / Restrictions  Precautions Precautions: Fall      Mobility  Bed Mobility Overal bed mobility: Needs Assistance Bed Mobility: Supine to Sit     Supine to sit: Mod assist     General bed mobility comments: Cues for technique and initiation  Transfers Overall transfer level: Needs assistance Equipment used: 1 person hand held assist (and support given at gait belt) Transfers: Sit to/from [facility] Sit to Stand: Mod assist Stand pivot transfers: Mod assist       General transfer comment: Heavy mod assist for transfers; assist for rise with sit to stand, which went pretty smoothly; Once the dynamic, weight shifting component of moving towards chair added, pt's balance and steadiness deteriorated, requiring heavy mod assist to safey make it to chair  Ambulation/Gait                Stairs            Wheelchair Mobility    Modified Rankin (Stroke Patients Only)       Balance Overall balance assessment: Needs assistance Sitting-balance support: Bilateral upper extremity supported Sitting balance-Leahy Scale: Fair     Standing balance support: Bilateral upper extremity supported Standing balance-Leahy Scale: Zero                               Pertinent Vitals/Pain Pain Assessment: No/denies pain    Home Living   Living Arrangements: Children (with daughter)               Additional Comments: Will need more reliable  info re: PLOF and amount of assist at home    Prior Function Level of Independence: Needs assistance         Comments: Per pt, her daughter and granddaughter assist her with transfers     Hand Dominance        Extremity/Trunk Assessment   Upper Extremity Assessment: Defer to OT evaluation           Lower Extremity Assessment: Generalized weakness (pt with decr Weight bearing RLE in standing)         Communication   Communication: Expressive difficulties  Cognition Arousal/Alertness:  Awake/alert Behavior During Therapy: WFL for tasks assessed/performed Overall Cognitive Status: No family/caregiver present to determine baseline cognitive functioning                      General Comments      Exercises        Assessment/Plan    PT Assessment Patient needs continued PT services  PT Diagnosis Generalized weakness   PT Problem List Decreased strength;Decreased activity tolerance;Decreased balance;Decreased mobility;Decreased coordination;Decreased cognition;Decreased knowledge of use of DME;Decreased safety awareness  PT Treatment Interventions DME instruction;Gait training;Functional mobility training;Therapeutic activities;Therapeutic exercise;Balance training;Neuromuscular re-education;Cognitive remediation;Patient/family education   PT Goals (Current goals can be found in the Care Plan section) Acute Rehab PT Goals Patient Stated Goal: did not state PT Goal Formulation: With patient Time For Goal Achievement: 12/29/13 Potential to Achieve Goals: Good    Frequency Min 3X/week   Barriers to discharge   Will need more info re: available assist at home; if 24 hour assist is available, I would favor dc'ing home with HHtherapy follow-up    Co-evaluation               End of Session Equipment Utilized During Treatment: Gait belt Activity Tolerance: Patient tolerated treatment well;Other (comment) (though quite fearful of falling) Patient left: in chair;with call bell/phone within reach;Other (comment) (chair alarm pad in chair) Nurse Communication: Mobility status;Other (comment) (Need for chair alarm box)         Time: 5681-2751 PT Time Calculation (min): 10 min   Charges:   PT Evaluation $Initial PT Evaluation Tier I: 1 Procedure     PT G CodesRoney Marion Hamff 12/15/2013, 6:02 PM  Roney Marion, [place]  Acute Rehabilitation Services Pager [phone] Office [phone]

## 2013-12-15 NOTE — Progress Notes (Addendum)
Progress Note  ELLIZABETH DACRUZ XAJ:[id number] DOB: 1950/10/10 DOA: 12/10/2013 PCP: Marco Collie, MD  Brief narrative: 63yo F w/ Hx chronic kidney disease on dialysis (TTS), vascular dementia ,diabetes, hypertension, and dry gangrene right great toe who presented to the ED at [facility] with a one-day history of central chest pain.    Upon evaluation at the [facility] ED patient was quite hypertensive with systolic blood pressure readings in the 220 range and an initial troponin level of 0.47. Patient's chest pain symptoms resolved after receiving oxygen and nitroglycerin. She was given 20 mg of labetalol which dropped her systolic blood pressure to a more appropriate range (140-160). She was started on a heparin drip, continued on IV nitroglycerin, and transferred to [facility].   After arrival at [facility] she was evaluated by the Cardiology who was concerned for possible non-STEMI.  She was taken to cardiac cath on 9/15 which revealed two-vessel coronary artery disease with 50-60% tubular stenosis involving the LAD ostium and proximal segment, with 80-90% mid LAD stenoses, 80% mid-distal LAD stenosis; 80% stenosis in a small branch of the OM 3 vessel; and high-grade RCA stenoses with 85% eccentric proximal, 95%, mid, and 85% mid distal stenoses.  After further planning, the pt was taken back to the lab on 9/18 for complex percutaneous artery intervention with high-speed rotational atherectomy, PTCA, and DES stenting of 3 high grade lesions in the RCA with insertion of a 2.75x28 and 2.5x38 mm Promus Premier DES stents post dilated with stent taper from 3.03-2.8 mm.  Assessment/Plan:  Hypertensive emergency improved  NSTEMI / 2V CAD cardiac catheterization revealed LAD and high grade RCA stenosis - now s/p difficult PTCA and DES placement in RCA lesions. Cardiology recommends asa and plavix indefinately and signed off  Anemia Hgb on 9/8 was 11.6 with nadir this admit of 7.2 - no  evidence of GIB - PRBCs were given on 9/16 (Cards wanted Hgb 10 or > around time of cath) - stool guaiacs ordered but not yet done - monitor  ESRD requiring chronic dialysis Nephrology following - usual dialysis days are T/Th/S  Leukocytosis/Abnormal urinalysis Urine culture with multiple contaminants - blood cultures with NGTD - no clear cut source for infection so no indication to add antibiotics at this time - no fever  gangrene right great toe/  Peripheral vascular disease Previously evaluated by Dr. 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 - she was deemed not a candidate for bypass procedure due to overall debility and nonambulatory status - focus to be on pain control - if she develops nonhealing wounds or gangrenous changes she would need a primary amputation. Likely not a candidate for amputation at this time due to NSTEMI with stent, but would be reasonable to discuss with Dr. Oneida Alar tomorrow.  Type II diabetes mellitus on insulin Current CBGs well controlled - continue Levemir and sliding scale insulin  Vascular dementia  Awaiting PT/OT for dispo  DVT prophylaxis: SQ heparin  Code Status: Full  Family Communication: No family at bedside Disposition Plan/Expected LOS: discharge after disposition clarified (PT/OT eval)  Consultants: Cardiology Nephrology  Procedures:  9/15 - Cardiac catheterization: LVEF= 50-55% with a small region of subtle mid-distal inferior hypocontractility.  -Predominant two-vessel coronary artery disease with 50-60% tubular stenosis involving the LAD ostium and proximal segment, with 80-90% mid LAD stenoses, 80% mid-distal LAD stenosis; 80% stenosis in a small branch of  the OM 3 vessel; and high-grade RCA stenoses with 85% eccentric proximal, 95%, mid, and 85% mid distal stenoses.  9/18 - Cardiac cath - complex percutaneous artery  intervention with high-speed rotational atherectomy, PTCA, and DES stenting of 3 high grade lesions in the RCA with insertion of a 2.75x28 and 2.5x38 mm Promus Premier DES stents post dilated with stent taper from 3.03-2.8 mm.  Antibiotics: None HPI/Subjective: C/o right great toe pain  Objective: Blood pressure 151/90, pulse 106, temperature 98.5 F (36.9 C), temperature source Oral, resp. rate 16, height 5\' 2"  (1.575 m), weight 51.801 kg (114 lb 3.2 oz), SpO2 100.00%.  Intake/Output Summary (Last 24 hours) at 12/15/13 1437 Last data filed at 12/15/13 1331  Gross per 24 hour  Intake    300 ml  Output      0 ml  Net    300 ml   Exam: Gen: asleep arousable. cooperative Chest: Clear to auscultation bilaterally, room air Cardiac: Regular rate and rhythm, no rubs murmurs or gallops, no peripheral edema Abdomen: Soft nontender nondistended, no ascites - bs+ Extremities: no signif c/c/e B LE - R great toe dry gangrene nail missing  Scheduled Meds:  Scheduled Meds: . amLODipine  10 mg Oral Daily  . antiseptic oral rinse  7 mL Mouth Rinse BID  . aspirin EC  81 mg Oral Daily  . atorvastatin  40 mg Oral q1800  . calcitRIOL  0.25 mcg Oral Q T,Th,Sa-HD  . clopidogrel  75 mg Oral Q breakfast  . [START ON 12/16/2013] darbepoetin (ARANESP) injection - DIALYSIS  100 mcg Intravenous Q Mon-HD  . ferric gluconate (FERRLECIT/NULECIT) IV  62.5 mg Intravenous Weekly  . heparin subcutaneous  5,000 Units Subcutaneous 3 times per day  . insulin aspart  0-9 Units Subcutaneous TID WC  . isosorbide mononitrate  60 mg Oral Daily  . metoprolol tartrate  100 mg Oral BID  . multivitamin  1 tablet Oral QHS  . oxyCODONE  10 mg Oral 3 times per day  . sodium chloride  3 mL Intravenous Q12H   Continuous Infusions: . sodium chloride      Data Reviewed: Basic Metabolic Panel:  Recent Labs Lab 12/10/13 0607 12/11/13 0340 12/12/13 0452 12/13/13 0520 12/14/13 0458  NA 139 133* 136* 136* 135*  K 4.4  4.9 3.8 3.4* 3.9  CL 94* 92* 93* 90* 96  CO2 23 23 25  32 27  GLUCOSE 112* 70 109* 95 122*  BUN 56* 65* 27* 42* 14  CREATININE 6.98* 7.86* 4.08* 5.73* 2.66*  CALCIUM 8.7 8.7 8.7 8.8 8.3*  PHOS  --   --   --  5.6* 2.5   Liver Function Tests:  Recent Labs Lab 12/13/13 0520 12/14/13 0458  ALBUMIN 2.2* 2.2*   CBC:  Recent Labs Lab 12/12/13 0452 12/13/13 0520 12/13/13 1256 12/13/13 1815 12/14/13 0252 12/15/13 0850  WBC 13.5* 12.0* 9.9  --  13.5* 13.8*  HGB 10.5* 8.8* 8.6*  --  9.2* 8.3*  HCT 30.5* 25.9* 25.2*  --  27.3* 24.0*  MCV 83.1 84.6 86.9  --  84.3 84.8  PLT 389 392 377 380 399 422*   Cardiac Enzymes:  Recent Labs Lab 12/10/13 0607 12/10/13 1020 12/10/13 1700  TROPONINI <0.30 <0.30 <0.30   CBG:  Recent Labs Lab 12/14/13 1559 12/14/13 2059 12/14/13 2235 12/15/13 0737 12/15/13 1206  GLUCAP 140* 160* 164* 107* 151*    Recent Results (from the past 240 hour(s))  MRSA PCR SCREENING     Status:  None   Collection Time    12/10/13  4:01 AM      Result Value Ref Range Status   MRSA by PCR NEGATIVE  NEGATIVE Final   Comment:            The GeneXpert MRSA Assay (FDA     approved for NASAL specimens     only), is one component of a     comprehensive MRSA colonization     surveillance program. It is not     intended to diagnose MRSA     infection nor to guide or     monitor treatment for     MRSA infections.  URINE CULTURE     Status: None   Collection Time    12/10/13  8:00 AM      Result Value Ref Range Status   Specimen Description URINE, RANDOM   Final   Special Requests add on if possible o/w obtain via I/O cath   Final   Culture  Setup Time     Final   Value: 12/11/2013 03:20     Performed at [facility] Count     Final   Value: >=100,000 COLONIES/ML     Performed at [facility]   Culture     Final   Value: Multiple bacterial morphotypes present, none predominant. Suggest appropriate recollection if clinically  indicated.     Performed at [facility]   Report Status 12/12/2013 FINAL   Final  CULTURE, BLOOD (ROUTINE X 2)     Status: None   Collection Time    12/10/13 10:20 AM      Result Value Ref Range Status   Specimen Description BLOOD RIGHT HAND   Final   Special Requests BOTTLES DRAWN AEROBIC ONLY 5CC   Final   Culture  Setup Time     Final   Value: 12/10/2013 16:05     Performed at [facility]   Culture     Final   Value:        BLOOD CULTURE RECEIVED NO GROWTH TO DATE CULTURE WILL BE HELD FOR 5 DAYS BEFORE ISSUING A FINAL NEGATIVE REPORT     Performed at [facility]   Report Status PENDING   Incomplete  CULTURE, BLOOD (ROUTINE X 2)     Status: None   Collection Time    12/10/13 10:30 AM      Result Value Ref Range Status   Specimen Description BLOOD RIGHT HAND   Final   Special Requests BOTTLES DRAWN AEROBIC ONLY 3CC   Final   Culture  Setup Time     Final   Value: 12/10/2013 16:05     Performed at [facility]   Culture     Final   Value:        BLOOD CULTURE RECEIVED NO GROWTH TO DATE CULTURE WILL BE HELD FOR 5 DAYS BEFORE ISSUING A FINAL NEGATIVE REPORT     Performed at [facility]   Report Status PENDING   Incomplete     Studies:  Time spent : 25 mins  Doree Barthel MD [facility] [phone]  On-Call/Text Page:      [email or web address]      password [facility]   12/15/2013, 2:37 PM   LOS: 5 days

## 2013-12-15 NOTE — Progress Notes (Signed)
Patient complaining of breakthrough pain in necrotic toe.  One time dose of oxycodone 5 mg PO ordered by Rogue Bussing NP, and regular dosage of 5 mg oxycodone TID PO increased to 10 mg starting in the morning.  Will continue to monitor.

## 2013-12-15 NOTE — Progress Notes (Signed)
Patient transferred from [facility].  Alert to self and place only per RN's report.  Complaining of pain 7/10 in right necrotic big toe.  Unable to give education due to altered mental status.  Follows commands well.  Notified Callahan NP of pain; one time dose oxycodone 5 mg PO ordered.  Will continue to monitor.

## 2013-12-16 ENCOUNTER — Encounter: Payer: Self-pay | Admitting: Cardiovascular Disease

## 2013-12-16 LAB — CULTURE, BLOOD (ROUTINE X 2)
CULTURE: NO GROWTH
Culture: NO GROWTH

## 2013-12-16 LAB — CBC
HCT: 23 % — ABNORMAL LOW (ref 36.0–46.0)
HEMOGLOBIN: 7.9 g/dL — AB (ref 12.0–15.0)
MCH: 29.3 pg (ref 26.0–34.0)
MCHC: 34.3 g/dL (ref 30.0–36.0)
MCV: 85.2 fL (ref 78.0–100.0)
PLATELETS: 375 10*3/uL (ref 150–400)
RBC: 2.7 MIL/uL — ABNORMAL LOW (ref 3.87–5.11)
RDW: 14.8 % (ref 11.5–15.5)
WBC: 12.2 10*3/uL — ABNORMAL HIGH (ref 4.0–10.5)

## 2013-12-16 LAB — RENAL FUNCTION PANEL
Albumin: 2.3 g/dL — ABNORMAL LOW (ref 3.5–5.2)
Anion gap: 15 (ref 5–15)
BUN: 36 mg/dL — AB (ref 6–23)
CALCIUM: 8.5 mg/dL (ref 8.4–10.5)
CO2: 24 mEq/L (ref 19–32)
CREATININE: 5.98 mg/dL — AB (ref 0.50–1.10)
Chloride: 100 mEq/L (ref 96–112)
GFR calc Af Amer: 8 mL/min — ABNORMAL LOW (ref >90)
GFR, EST NON AFRICAN AMERICAN: 7 mL/min — AB (ref >90)
Glucose, Bld: 88 mg/dL (ref 70–99)
PHOSPHORUS: 4.8 mg/dL — AB (ref 2.3–4.6)
Potassium: 4.7 mEq/L (ref 3.7–5.3)
Sodium: 139 mEq/L (ref 137–147)

## 2013-12-16 MED ORDER — OXYCODONE HCL 5 MG PO TABS
ORAL_TABLET | ORAL | Status: AC
  Filled 2013-12-16: qty 1

## 2013-12-16 MED ORDER — ALTEPLASE 2 MG IJ SOLR
2.0000 mg | Freq: Once | INTRAMUSCULAR | Status: DC | PRN
  Filled 2013-12-16: qty 2

## 2013-12-16 MED ORDER — SODIUM CHLORIDE 0.9 % IV SOLN: 100.0000 mL | INTRAVENOUS | Status: DC | PRN

## 2013-12-16 MED ORDER — SODIUM CHLORIDE 0.9 % IJ SOLN
10.0000 mL | INTRAMUSCULAR | Status: DC | PRN
  Administered 2013-12-16: 20 mL

## 2013-12-16 MED ORDER — NEPRO/CARBSTEADY PO LIQD: 237.0000 mL | ORAL | Status: DC | PRN

## 2013-12-16 MED ORDER — OXYCODONE HCL 5 MG PO TABS
5.0000 mg | ORAL_TABLET | Freq: Once | ORAL | Status: AC
  Administered 2013-12-16: 5 mg via ORAL

## 2013-12-16 MED ORDER — LIDOCAINE HCL (PF) 1 % IJ SOLN: 5.0000 mL | INTRAMUSCULAR | Status: DC | PRN

## 2013-12-16 MED ORDER — GABAPENTIN 600 MG PO TABS
300.0000 mg | ORAL_TABLET | Freq: Two times a day (BID) | ORAL | Status: DC
  Administered 2013-12-16: 300 mg via ORAL
  Filled 2013-12-16 (×3): qty 0.5

## 2013-12-16 MED ORDER — GABAPENTIN 300 MG PO CAPS
300.0000 mg | ORAL_CAPSULE | Freq: Two times a day (BID) | ORAL | Status: DC
  Administered 2013-12-16 – 2013-12-17 (×2): 300 mg via ORAL
  Filled 2013-12-16 (×5): qty 1

## 2013-12-16 MED ORDER — ACETAMINOPHEN 325 MG PO TABS
ORAL_TABLET | ORAL | Status: AC
  Administered 2013-12-16: 650 mg
  Filled 2013-12-16: qty 2

## 2013-12-16 MED ORDER — LIDOCAINE-PRILOCAINE 2.5-2.5 % EX CREA: 1.0000 "application " | TOPICAL_CREAM | CUTANEOUS | Status: DC | PRN

## 2013-12-16 MED ORDER — DARBEPOETIN ALFA-POLYSORBATE 100 MCG/0.5ML IJ SOLN
INTRAMUSCULAR | Status: AC
  Filled 2013-12-16: qty 0.5

## 2013-12-16 MED ORDER — NEPRO/CARBSTEADY PO LIQD
237.0000 mL | Freq: Two times a day (BID) | ORAL | Status: DC
  Administered 2013-12-16 – 2013-12-17 (×2): 237 mL via ORAL

## 2013-12-16 MED ORDER — PENTAFLUOROPROP-TETRAFLUOROETH EX AERO: 1.0000 "application " | INHALATION_SPRAY | CUTANEOUS | Status: DC | PRN

## 2013-12-16 MED ORDER — HEPARIN SODIUM (PORCINE) 1000 UNIT/ML DIALYSIS
1000.0000 [IU] | INTRAMUSCULAR | Status: DC | PRN
  Filled 2013-12-16: qty 1

## 2013-12-16 MED FILL — Sodium Chloride IV Soln 0.9%: INTRAVENOUS | Qty: 50 | Status: AC

## 2013-12-16 NOTE — Progress Notes (Signed)
Subjective:  Seen on dialysis, no current complaints, no dyspnea or chest pain  Objective: Vital signs in last 24 hours: Temp:  [97.5 F (36.4 C)-98.6 F (37 C)] 98.6 F (37 C) (09/21 0546) Pulse Rate:  [78-106] 86 (09/21 0546) Resp:  [16-20] 20 (09/21 0546) BP: (120-151)/(49-90) 131/53 mmHg (09/21 0546) SpO2:  [98 %-100 %] 98 % (09/21 0546) Weight:  [53.524 kg (118 lb)] 53.524 kg (118 lb) (09/20 2115) Weight change: 1.724 kg (3 lb 12.8 oz)  Intake/Output from previous day: 09/20 0701 - 09/21 0700 In: 66 [P.O.:300; I.V.:40] Out: -  Intake/Output this shift:   Lab Results:  Recent Labs  12/14/13 0252 12/15/13 0850  WBC 13.5* 13.8*  HGB 9.2* 8.3*  HCT 27.3* 24.0*  PLT 399 422*   BMET:  Recent Labs  12/14/13 0458  NA 135*  K 3.9  CL 96  CO2 27  GLUCOSE 122*  BUN 14  CREATININE 2.66*  CALCIUM 8.3*  ALBUMIN 2.2*   No results found for this basename: PTH,  in the last 72 hours Iron Studies: No results found for this basename: IRON, TIBC, TRANSFERRIN, FERRITIN,  in the last 72 hours  Studies/Results: No results found.  EXAM:  General appearance: Alert, in no apparent distress  Resp: CTA without rales, rhonchi, or wheezes  Cardio: RRR without murmur or rub  GI: + BS, soft and nontender  Extremities: No edema, R big toe without nail, surrounding darkened area  Access: AVG @ LUA with BFR 350 cc/min   Dialysis Orders: TTS @[facility]   3hr 45 min 52.5kgs 2K/2Ca 6000Heparin 160 350/1.5 L AVG  calcitriol 0.25 mcg Aranesp 25 q 2 weeks (last 9.1) Venofer 50 q wk  Profile 2   Assessment/Plan:  1. CAD/NSTEMI - s/p complex PCI with high-speed rotational atherectomy, PTCA, and DES of 3 high grade lesions in RCA yesterday, no further pain, on Plavix & ASA per Cardiology.  2. ESRD - HD on TTS @ [facility], K 3.9. Next HD tomorrow, off schedule. 3. HTN/Volume - BP 122/49 on Amlodipine 10 mg qd, Metoprolol 100 mg bid; wt 51.8 kg, below EDW.  4. Anemia - Hgb down to 8.3 yesterday, s/p  2 U PRBCs 9/16, Aranesp 100 mcg on Mon, weekly Fe.  CBC pending today. 5. Sec HPT - Ca 8.3 (9.7 corrected), P 2.5; Calcitriol 0.25 mcg on TTS, no binders.  6. Nutrition - Alb 2.2, carb-mod renal diet, multivitamin.  7. DM - per primary.  8. Ischemic right great toe- no change   LOS: 6 days   LYLES,CHARLES 12/16/2013,7:51 AM  Pt seen, examined and agree w A/P as above.  Kelly Splinter MD pager [phone]    cell [phone] 12/16/2013, 12:32 PM

## 2013-12-16 NOTE — Progress Notes (Signed)
OT Cancellation Note:  Pt currently in HD.  Will continue to follow. 12/16/2013 Nestor Lewandowsky, OTR/L Pager: [phone]

## 2013-12-16 NOTE — Progress Notes (Signed)
Progress Note  [name] ZDG:[id number] DOB: [birth year] DOA: 12/10/2013 PCP: Marco Collie, MD  Brief narrative: 63yo F w/ Hx chronic kidney disease on dialysis (TTS), vascular dementia ,diabetes, hypertension, and dry gangrene right great toe who presented to the ED at [facility] with a one-day history of central chest pain.    Upon evaluation at the [facility] ED patient was quite hypertensive with systolic blood pressure readings in the 220 range and an initial troponin level of 0.47. Patient's chest pain symptoms resolved after receiving oxygen and nitroglycerin. She was given 20 mg of labetalol which dropped her systolic blood pressure to a more appropriate range (140-160). She was started on a heparin drip, continued on IV nitroglycerin, and transferred to [facility].   After arrival at [facility] she was evaluated by the Cardiology who was concerned for possible non-STEMI.  She was taken to cardiac cath on 9/15 which revealed two-vessel coronary artery disease with 50-60% tubular stenosis involving the LAD ostium and proximal segment, with 80-90% mid LAD stenoses, 80% mid-distal LAD stenosis; 80% stenosis in a small branch of the OM 3 vessel; and high-grade RCA stenoses with 85% eccentric proximal, 95%, mid, and 85% mid distal stenoses.  After further planning, the pt was taken back to the lab on 9/18 for complex percutaneous artery intervention with high-speed rotational atherectomy, PTCA, and DES stenting of 3 high grade lesions in the RCA with insertion of a 2.75x28 and 2.5x38 mm Promus Premier DES stents post dilated with stent taper from 3.03-2.8 mm.  Assessment/Plan:  Hypertensive emergency improved  NSTEMI / 2V CAD cardiac catheterization revealed LAD and high grade RCA stenosis - now s/p difficult PTCA and DES placement in RCA lesions. Cardiology recommends asa and plavix indefinately and signed off  Anemia Hgb on 9/8 was 11.6 with nadir this admit of 7.2 - no  evidence of GIB - PRBCs were given on 9/16 (Cards wanted Hgb 10 or > around time of cath) - stool guaiacs ordered but not yet done - monitor  ESRD requiring chronic dialysis Nephrology following - usual dialysis days are T/Th/S  Leukocytosis/Abnormal urinalysis Urine culture with multiple contaminants - blood cultures with NGTD - no clear cut source for infection so no indication to add antibiotics at this time - no fever  gangrene right great toe/  Peripheral vascular disease Previously evaluated by Dr. 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 - she was deemed not a candidate for bypass procedure due to overall debility and nonambulatory status - focus to be on pain control - if she develops nonhealing wounds or gangrenous changes she would need a primary amputation. Likely not a candidate for amputation at this time due to NSTEMI with stent -add neurontin   Type II diabetes mellitus on insulin Current CBGs well controlled - continue Levemir and sliding scale insulin  Vascular dementia  Home in AM  DVT prophylaxis: SQ heparin  Code Status: Full  Family Communication: No family at bedside- called daughter- mailbox full Disposition Plan/Expected LOS:  Home in AM?  Consultants: Cardiology Nephrology  Procedures:  9/15 - Cardiac catheterization: LVEF= 50-55% with a small region of subtle mid-distal inferior hypocontractility.  -Predominant two-vessel coronary artery disease with 50-60% tubular stenosis involving the LAD ostium and proximal segment, with 80-90% mid LAD stenoses, 80% mid-distal LAD stenosis; 80% stenosis in a small branch of the OM 3 vessel; and high-grade  RCA stenoses with 85% eccentric proximal, 95%, mid, and 85% mid distal stenoses.  9/18 - Cardiac cath - complex percutaneous artery intervention with high-speed rotational atherectomy, PTCA, and DES  stenting of 3 high grade lesions in the RCA with insertion of a 2.75x28 and 2.5x38 mm Promus Premier DES stents post dilated with stent taper from 3.03-2.8 mm.  Antibiotics: None HPI/Subjective: Still with pain in toe   Objective: Blood pressure 141/51, pulse 88, temperature 98.5 F (36.9 C), temperature source Oral, resp. rate 18, height 5\' 2"  (1.575 m), weight 54 kg (119 lb 0.8 oz), SpO2 95.00%.  Intake/Output Summary (Last 24 hours) at 12/16/13 1252 Last data filed at 12/16/13 1122  Gross per 24 hour  Intake    260 ml  Output   1000 ml  Net   -740 ml   Exam: Gen: awake a dialysis Chest: Clear to auscultation bilaterally, room air Cardiac: Regular rate and rhythm, no rubs murmurs or gallops, no peripheral edema Abdomen: Soft nontender nondistended, no ascites - bs+ Extremities: no signif c/c/e B LE - R great toe dry gangrene nail missing  Scheduled Meds:  Scheduled Meds: . amLODipine  10 mg Oral Daily  . antiseptic oral rinse  7 mL Mouth Rinse BID  . aspirin EC  81 mg Oral Daily  . atorvastatin  40 mg Oral q1800  . calcitRIOL  0.25 mcg Oral Q T,Th,Sa-HD  . clopidogrel  75 mg Oral Q breakfast  . darbepoetin      . darbepoetin (ARANESP) injection - DIALYSIS  100 mcg Intravenous Q Mon-HD  . ferric gluconate (FERRLECIT/NULECIT) IV  62.5 mg Intravenous Weekly  . gabapentin  300 mg Oral BID  . heparin subcutaneous  5,000 Units Subcutaneous 3 times per day  . insulin aspart  0-9 Units Subcutaneous TID WC  . isosorbide mononitrate  60 mg Oral Daily  . metoprolol tartrate  100 mg Oral BID  . multivitamin  1 tablet Oral QHS  . oxyCODONE      . oxyCODONE  10 mg Oral 3 times per day  . sodium chloride  3 mL Intravenous Q12H   Continuous Infusions: . sodium chloride 10 mL/hr (12/15/13 1831)    Data Reviewed: Basic Metabolic Panel:  Recent Labs Lab 12/11/13 0340 12/12/13 0452 12/13/13 0520 12/14/13 0458 12/16/13 0742  NA 133* 136* 136* 135* 139  K 4.9 3.8 3.4* 3.9  4.7  CL 92* 93* 90* 96 100  CO2 23 25 32 27 24  GLUCOSE 70 109* 95 122* 88  BUN 65* 27* 42* 14 36*  CREATININE 7.86* 4.08* 5.73* 2.66* 5.98*  CALCIUM 8.7 8.7 8.8 8.3* 8.5  PHOS  --   --  5.6* 2.5 4.8*   Liver Function Tests:  Recent Labs Lab 12/13/13 0520 12/14/13 0458 12/16/13 0742  ALBUMIN 2.2* 2.2* 2.3*   CBC:  Recent Labs Lab 12/13/13 0520 12/13/13 1256 12/13/13 1815 12/14/13 0252 12/15/13 0850 12/16/13 0742  WBC 12.0* 9.9  --  13.5* 13.8* 12.2*  HGB 8.8* 8.6*  --  9.2* 8.3* 7.9*  HCT 25.9* 25.2*  --  27.3* 24.0* 23.0*  MCV 84.6 86.9  --  84.3 84.8 85.2  PLT 392 377 380 399 422* 375   Cardiac Enzymes:  Recent Labs Lab 12/10/13 0607 12/10/13 1020 12/10/13 1700  TROPONINI <0.30 <0.30 <0.30   CBG:  Recent Labs Lab 12/14/13 2235 12/15/13 0737 12/15/13 1206 12/15/13 1741 12/15/13 2054  GLUCAP 164* 107* 151* 110* 139*    Recent Results (from  the past 240 hour(s))  MRSA PCR SCREENING     Status: None   Collection Time    12/10/13  4:01 AM      Result Value Ref Range Status   MRSA by PCR NEGATIVE  NEGATIVE Final   Comment:            The GeneXpert MRSA Assay (FDA     approved for NASAL specimens     only), is one component of a     comprehensive MRSA colonization     surveillance program. It is not     intended to diagnose MRSA     infection nor to guide or     monitor treatment for     MRSA infections.  URINE CULTURE     Status: None   Collection Time    12/10/13  8:00 AM      Result Value Ref Range Status   Specimen Description URINE, RANDOM   Final   Special Requests add on if possible o/w obtain via I/O cath   Final   Culture  Setup Time     Final   Value: 12/11/2013 03:20     Performed at [facility] Count     Final   Value: >=100,000 COLONIES/ML     Performed at [facility]   Culture     Final   Value: Multiple bacterial morphotypes present, none predominant. Suggest appropriate recollection if clinically  indicated.     Performed at [facility]   Report Status 12/12/2013 FINAL   Final  CULTURE, BLOOD (ROUTINE X 2)     Status: None   Collection Time    12/10/13 10:20 AM      Result Value Ref Range Status   Specimen Description BLOOD RIGHT HAND   Final   Special Requests BOTTLES DRAWN AEROBIC ONLY 5CC   Final   Culture  Setup Time     Final   Value: 12/10/2013 16:05     Performed at [facility]   Culture     Final   Value: NO GROWTH 5 DAYS     Performed at [facility]   Report Status 12/16/2013 FINAL   Final  CULTURE, BLOOD (ROUTINE X 2)     Status: None   Collection Time    12/10/13 10:30 AM      Result Value Ref Range Status   Specimen Description BLOOD RIGHT HAND   Final   Special Requests BOTTLES DRAWN AEROBIC ONLY 3CC   Final   Culture  Setup Time     Final   Value: 12/10/2013 16:05     Performed at [facility]   Culture     Final   Value: NO GROWTH 5 DAYS     Performed at [facility]   Report Status 12/16/2013 FINAL   Final     Studies:  Time spent : 25 mins  Eulogio Bear [facility] [phone]  On-Call/Text Page:      [email or web address]      password [facility]   12/16/2013, 12:52 PM   LOS: 6 days

## 2013-12-16 NOTE — Progress Notes (Signed)
NUTRITION FOLLOW-UP  Patient meets the criteria for moderate MALNUTRITION in the context of chronic illness with 10% weight loss in 6 months and moderate muscle and subcutaneous fat depletion.  DOCUMENTATION CODES Per approved criteria  -Non-severe (moderate) malnutrition in the context of chronic illness   INTERVENTION: Provide Nepro shakes po BID, each provides 425 kcal, 19 g protein  NUTRITION DIAGNOSIS: Inadequate oral intake related to inability to eat as evidenced by NPO status; Resolved.  NEW NUTRITION Dx: Increased nutrient needs related to chronic illness, ESRD as evidenced by estimated nutrition needs.  Goal: Patient will meet >/=90% of estimated nutrition needs; met  Monitor:  Diet advancement, PO intake, weight, labs, I/Os  63 y.o. female  Admitting Dx: Hypertensive emergency  ASSESSMENT: 63 year old female with ESRD on dialysis TTS, vascular dementia, diabetes, hypertension, presents with 1 day history of chest pain, found to be in hypertensive emergency with SBPs in the 200s. Concern for possible NSTEMI.   9/15-Patient is currently NPO for cath later today.  Patient reports that she typically eats 3 meals a day, and eats all of her meals. However, per chart review, she has lost about 9% of her usual weight in 6 months and 33% of her weight in the last year, which is significant over the time frame. History from patient is limited.   9/21-Diet advanced. Pt reports having a good appetite. Meal completion is 90%. She reports she would like nepro ordered. Will order. Pt was encouraged to continue eating her food at meals and to drink her supplement.  Labs: High BUN, creatinine, and phosphorous (4.8).  Height: Ht Readings from Last 1 Encounters:  12/14/13 5' 2"  (1.575 m)    Weight: Wt Readings from Last 1 Encounters:  12/15/13 118 lb (53.524 kg)    BMI:  Body mass index is 21.58 kg/(m^2). Patient is normal weight.   Re-Estimated Nutritional Needs: Kcal:  1600-1800 kcal Protein: 75-85g Fluid: 1.2 L/day  Skin: diabetic ulcer on right toe  Diet Order: Diabetic/renal with 1200 ml fluids   Intake/Output Summary (Last 24 hours) at 12/16/13 1007 Last data filed at 12/15/13 1900  Gross per 24 hour  Intake    300 ml  Output      0 ml  Net    300 ml    Last BM: PTA   Labs:   Recent Labs Lab 12/13/13 0520 12/14/13 0458 12/16/13 0742  NA 136* 135* 139  K 3.4* 3.9 4.7  CL 90* 96 100  CO2 32 27 24  BUN 42* 14 36*  CREATININE 5.73* 2.66* 5.98*  CALCIUM 8.8 8.3* 8.5  PHOS 5.6* 2.5 4.8*  GLUCOSE 95 122* 88    CBG (last 3)   Recent Labs  12/15/13 1206 12/15/13 1741 12/15/13 2054  GLUCAP 151* 110* 139*    Scheduled Meds: . amLODipine  10 mg Oral Daily  . antiseptic oral rinse  7 mL Mouth Rinse BID  . aspirin EC  81 mg Oral Daily  . atorvastatin  40 mg Oral q1800  . calcitRIOL  0.25 mcg Oral Q T,Th,Sa-HD  . clopidogrel  75 mg Oral Q breakfast  . darbepoetin      . darbepoetin (ARANESP) injection - DIALYSIS  100 mcg Intravenous Q Mon-HD  . ferric gluconate (FERRLECIT/NULECIT) IV  62.5 mg Intravenous Weekly  . heparin subcutaneous  5,000 Units Subcutaneous 3 times per day  . insulin aspart  0-9 Units Subcutaneous TID WC  . isosorbide mononitrate  60 mg Oral Daily  .  metoprolol tartrate  100 mg Oral BID  . multivitamin  1 tablet Oral QHS  . oxyCODONE  10 mg Oral 3 times per day  . sodium chloride  3 mL Intravenous Q12H    Continuous Infusions: . sodium chloride 10 mL/hr (12/15/13 1831)    Past Medical History  Diagnosis Date  . Diabetes mellitus   . Hypertension   . CHF (congestive heart failure)   . Hyperlipidemia   . End-stage renal disease on hemodialysis     Started dialysis in late 2014. Gets HD in [place] on TTS schedule.     . Thyroid disease   . Depression   . Shortness of breath     at night  . Arthritis   . Dementia   . Stroke     2013, weakness- all over  . Constipation   . Vision  disturbance   . End stage renal disease 03/30/2011  . Coronary artery disease     Past Surgical History  Procedure Laterality Date  . Arteriovenous graft placement      left upper extremity  . Tubal ligation      x2  . Av fistula placement Left 07/02/2012    Procedure: ARTERIOVENOUS (AV) FISTULA CREATION;  Surgeon: Conrad [place] West, MD;  Location: [place];  Service: Vascular;  Laterality: Left;  . Av fistula placement Left 10/01/2012    Procedure: INSERTION OF ARTERIOVENOUS (AV) GORE-TEX GRAFT LEFT UPPER ARM;  Surgeon: Conrad Lehi, MD;  Location: [place];  Service: Vascular;  Laterality: Left;  Ultrasound guided    Kallie Locks, MS, Provisional LDN Pager # [phone] After hours/ weekend pager # [phone]

## 2013-12-17 LAB — CBC
HEMATOCRIT: 20.8 % — AB (ref 36.0–46.0)
Hemoglobin: 7 g/dL — ABNORMAL LOW (ref 12.0–15.0)
MCH: 29.2 pg (ref 26.0–34.0)
MCHC: 33.7 g/dL (ref 30.0–36.0)
MCV: 86.7 fL (ref 78.0–100.0)
Platelets: 396 10*3/uL (ref 150–400)
RBC: 2.4 MIL/uL — ABNORMAL LOW (ref 3.87–5.11)
RDW: 15 % (ref 11.5–15.5)
WBC: 10.1 10*3/uL (ref 4.0–10.5)

## 2013-12-17 LAB — BASIC METABOLIC PANEL
Anion gap: 11 (ref 5–15)
BUN: 16 mg/dL (ref 6–23)
CALCIUM: 8.5 mg/dL (ref 8.4–10.5)
CHLORIDE: 100 meq/L (ref 96–112)
CO2: 29 meq/L (ref 19–32)
CREATININE: 4.1 mg/dL — AB (ref 0.50–1.10)
GFR calc Af Amer: 12 mL/min — ABNORMAL LOW (ref >90)
GFR calc non Af Amer: 11 mL/min — ABNORMAL LOW (ref >90)
GLUCOSE: 130 mg/dL — AB (ref 70–99)
Potassium: 3.6 mEq/L — ABNORMAL LOW (ref 3.7–5.3)
Sodium: 140 mEq/L (ref 137–147)

## 2013-12-17 LAB — GLUCOSE, CAPILLARY
GLUCOSE-CAPILLARY: 235 mg/dL — AB (ref 70–99)
Glucose-Capillary: 101 mg/dL — ABNORMAL HIGH (ref 70–99)
Glucose-Capillary: 81 mg/dL (ref 70–99)

## 2013-12-17 MED ORDER — METOPROLOL TARTRATE 100 MG PO TABS: 100.0000 mg | ORAL_TABLET | Freq: Two times a day (BID) | ORAL | Status: DC

## 2013-12-17 MED ORDER — CALCITRIOL 0.25 MCG PO CAPS
0.2500 ug | ORAL_CAPSULE | ORAL | Status: AC
Start: 2013-12-17 — End: ?

## 2013-12-17 MED ORDER — ISOSORBIDE MONONITRATE ER 60 MG PO TB24: 60.0000 mg | ORAL_TABLET | Freq: Every day | ORAL | Status: AC

## 2013-12-17 MED ORDER — GABAPENTIN 300 MG PO CAPS: 300.0000 mg | ORAL_CAPSULE | Freq: Two times a day (BID) | ORAL | Status: DC

## 2013-12-17 MED ORDER — DARBEPOETIN ALFA-POLYSORBATE 100 MCG/0.5ML IJ SOLN: 100.0000 ug | INTRAMUSCULAR | Status: DC

## 2013-12-17 MED ORDER — ASPIRIN 81 MG PO TBEC: 81.0000 mg | DELAYED_RELEASE_TABLET | Freq: Every day | ORAL | Status: AC

## 2013-12-17 MED ORDER — NEPRO/CARBSTEADY PO LIQD: 237.0000 mL | Freq: Two times a day (BID) | ORAL | Status: AC

## 2013-12-17 NOTE — Progress Notes (Signed)
Note/chart reviewed.  Katie Mikiya Nebergall, RD, LDN Pager #: [phone] After-Hours Pager #: [phone]  

## 2013-12-17 NOTE — Care Management Note (Signed)
[facility] MANAGEMENT NOTE 12/17/2013  Patient:  [name], [name]   Account Number:  [id number]  Date Initiated:  12/10/2013  Documentation initiated by:  DAVIS,RHONDA  Subjective/Objective Assessment:   pt transferred from [place] [facility] with nstemi and hx of renal failure requiring hd/     Action/Plan:   home when stable  12/17/2013 Met with pt who is unable to plan, spoke with pt daughter who plans to take this pt home, HH previously with [facility] will be resumed pt has DME   Anticipated DC Date:  12/17/2013   Anticipated DC Plan:  [place]  In-house referral  NA      DC Planning Services  CM consult      PAC Choice  NA   Choice offered to / List presented to:  NA      DME agency  NA     [place] arranged  HH-1 RN  [place].   Status of service:  Completed, signed off Medicare Important Message given?  YES (If response is "NO", the following Medicare IM given date fields will be blank) Date Medicare IM given:  12/17/2013 Medicare IM given by:  Elane Peabody Date Additional Medicare IM given:   Additional Medicare IM given by:    Discharge Disposition:  [place]  Per UR Regulation:  Reviewed for med. necessity/level of care/duration of stay  If discussed at [place] of Stay Meetings, dates discussed:    Comments:  12/17/2013 Spoke with pt daughter who plans to take this pt home for care, daughter lives with pt , per the daughter, the pt is never alone. She has a HH aide thur her Medicaid which the daughter is working with to change current agency, hospital CM unable to assist with this. HHRN, PT , OT and Social worker arranged thru [facility]. Pt has alll DME, this CM will leave info on [facility] for the family. Family provides transportation to HD. No other needs identified. Jasmine Pang RN MPH, case manager, [phone]  [phone] Davis,RN,BSN,CCM: patient for heart cath today/iv  heparin held for cath lab/iv ntg cont.'d/  [id number]/Rhonda Davis,RN,BSN,CCM: Hypertensive emergency/NSTEMI/ 2V CAD Cardiology following-cardiac catheterization revealed LAD and high grade RCA stenosis; Cards planning PCI 9/18-continue IV NTG and Heparin, beta blocker, aspirin, and statin. Was on Plavix prior to admission   [id number]/Rhonda Davis,RN,BSN,CCM

## 2013-12-17 NOTE — Progress Notes (Signed)
Patient Discharge: Disposition: Patient discharged home with daughter. Education: Patient and daughter educated about follow-up appointments, medications, prescriptions, and also provided a easy to read handout on kidney failure.  Daughter understood and acknowledged. IV: IJ left was removed by the IV team before discharge. Patient belongings:  Patient took all her belongings with her.

## 2013-12-17 NOTE — Discharge Summary (Signed)
Physician Discharge Summary  CONNA TERADA BDZ:[id number] DOB: 04-19-1950 DOA: 12/10/2013  PCP: Marco Collie, MD  Admit date: 12/10/2013 Discharge date: 12/17/2013  Time spent: 35 minutes  Recommendations for Outpatient Follow-up:  1. Home health 2. 24 hour care  Discharge Diagnoses:  Active Problems:   CKD (chronic kidney disease) stage V requiring chronic dialysis   Type II or unspecified type diabetes mellitus without mention of complication, not stated as uncontrolled   Vascular dementia   Peripheral vascular disease, unspecified   Hypertensive emergency   NSTEMI (non-ST elevated myocardial infarction)   Leukocytosis   Abnormal urinalysis   Malnutrition of moderate degree   Chest pain   Discharge Condition: improved  Diet recommendation: renal  Filed Weights   12/16/13 0737 12/16/13 1122 12/16/13 2221  Weight: 55 kg (121 lb 4.1 oz) 54 kg (119 lb 0.8 oz) 55.2 kg (121 lb 11.1 oz)    History of present illness:  [name] is a 63 y.o. female with ESRD dialysis TTS who presents to the ED at [facility] with c/o 1 day history of chest pain. Pain is a tightness located in her central chest. Wasn't feeling well earlier today and so daughter apparently had patient skip her norvasc at home. Unclear if CP onset prior to or after skipping BP meds. Per patient daughter was also suspicious that they "may have taken too much fluid off with dialysis on Sat".  Regardless of the order of events, patient presented to [facility] ED with CP. In the ED at [facility] she was found to be in hypertensive emergency with SBPs in the 220s, initial troponin of 0.47. CP resolved after she was put on oxygen, NTG, and received 20mg  labetalol which dropped her BP down into the 140s-160s. Heparin gtt, nitro gtt started and patient transferred to [facility].   Hospital Course:  Hypertensive emergency  improved   NSTEMI / 2V CAD  cardiac catheterization revealed LAD and high grade RCA stenosis - now s/p difficult  PTCA and DES placement in RCA lesions. Cardiology recommends asa and plavix indefinately and signed off   Anemia  Hgb on 9/8 was 11.6 with nadir this admit of 7.2 - no evidence of GIB - PRBCs were given on 9/16 (Cards wanted Hgb 10 or > around time of cath)  ESRD requiring chronic dialysis  Nephrology following - usual dialysis days are T/Th/S   Leukocytosis/Abnormal urinalysis  Urine culture with multiple contaminants - blood cultures with NGTD - no clear cut source for infection so no indication to add antibiotics at this time - no fever   gangrene right great toe/ Peripheral vascular disease  Previously evaluated by Dr. 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 - she was deemed not a candidate for bypass procedure due to overall debility and nonambulatory status - focus to be on pain control - if she develops nonhealing wounds or gangrenous changes she would need a primary amputation.  Follow up with Dr. Oneida Alar .  Type II diabetes mellitus on insulin  Current CBGs well controlled- not taking insulin at home  Vascular dementia   Procedures: 9/15 - Cardiac catheterization: LVEF= 50-55% with a small region of subtle mid-distal inferior hypocontractility.  -Predominant two-vessel coronary artery disease with 50-60% tubular stenosis involving the LAD ostium and proximal segment, with 80-90% mid LAD stenoses, 80% mid-distal LAD stenosis; 80% stenosis in a small branch of the OM 3 vessel; and  high-grade RCA stenoses with 85% eccentric proximal, 95%, mid, and 85% mid distal stenoses.  9/18 - Cardiac cath - complex percutaneous artery intervention with high-speed rotational atherectomy, PTCA, and DES stenting of 3 high grade lesions in the RCA with insertion of a 2.75x28 and 2.5x38 mm Promus Premier DES stents post dilated with stent taper from 3.03-2.8  mm.   Consultations:  Cardiology  nephrology  Discharge Exam: Filed Vitals:   12/17/13 0919  BP: 128/52  Pulse: 83  Temp: 99.2 F (37.3 C)  Resp: 16    General: pleasant Cardiovascular: rrr Respiratory: clear  Discharge Instructions You were cared for by a hospitalist during your hospital stay. If you have any questions about your discharge medications or the care you received while you were in the hospital after you are discharged, you can call the unit and asked to speak with the hospitalist on call if the hospitalist that took care of you is not available. Once you are discharged, your primary care physician will handle any further medical issues. Please note that NO REFILLS for any discharge medications will be authorized once you are discharged, as it is imperative that you return to your primary care physician (or establish a relationship with a primary care physician if you do not have one) for your aftercare needs so that they can reassess your need for medications and monitor your lab values.  Discharge Instructions   Discharge instructions    Complete by:  As directed   Home health Continue with dialysis Renal diet 24 hour care by family     Increase activity slowly    Complete by:  As directed           Current Discharge Medication List    START taking these medications   Details  aspirin EC 81 MG EC tablet Take 1 tablet (81 mg total) by mouth daily. Qty: 30 tablet, Refills: 0    calcitRIOL (ROCALTROL) 0.25 MCG capsule Take 1 capsule (0.25 mcg total) by mouth Every Tuesday,Thursday,and Saturday with dialysis. Qty: 15 capsule, Refills: 0    darbepoetin (ARANESP) 100 MCG/0.5ML SOLN injection Inject 0.5 mLs (100 mcg total) into the vein every Monday with hemodialysis. Qty: 4.2 mL    gabapentin (NEURONTIN) 300 MG capsule Take 1 capsule (300 mg total) by mouth 2 (two) times daily. Qty: 60 capsule, Refills: 0    isosorbide mononitrate (IMDUR) 60 MG 24 hr  tablet Take 1 tablet (60 mg total) by mouth daily. Qty: 30 tablet, Refills: 0    metoprolol (LOPRESSOR) 100 MG tablet Take 1 tablet (100 mg total) by mouth 2 (two) times daily. Qty: 60 tablet, Refills: 0    Nutritional Supplements (FEEDING SUPPLEMENT, NEPRO CARB STEADY,) LIQD Take 237 mLs by mouth 2 (two) times daily between meals. Qty: 12 Can, Refills: 12      CONTINUE these medications which have NOT CHANGED   Details  amLODipine (NORVASC) 10 MG tablet Take 1 tablet (10 mg total) by mouth daily. Qty: 30 tablet, Refills: 1    clopidogrel (PLAVIX) 75 MG tablet Take 1 tablet (75 mg total) by mouth daily with breakfast. Qty: 30 tablet, Refills: 6   Associated Diagnoses: Peripheral vascular disease, unspecified    multivitamin (RENA-VIT) TABS tablet Take 1 tablet by mouth at bedtime. Qty: 30 tablet, Refills: 0    oxycodone (OXY-IR) 5 MG capsule Take 1 capsule (5 mg total) by mouth 3 (three) times daily. Qty: 30 capsule, Refills: 0   Associated Diagnoses: Atherosclerosis of native  arteries of the extremities with gangrene    simvastatin (ZOCOR) 20 MG tablet Take 1 tablet (20 mg total) by mouth daily at 6 PM. Qty: 30 tablet, Refills: 0    ZOFRAN 4 MG tablet Take 4 mg by mouth every 8 (eight) hours as needed for nausea or vomiting.       STOP taking these medications     Insulin Detemir (LEVEMIR FLEXPEN) 100 UNIT/ML Pen        Allergies  Allergen Reactions  . Phenergan [Promethazine Hcl]     unknown   Follow-up Information   Follow up with HODGES,BETH, MD In 1 week.   Specialty:  Family Medicine   Contact information:   [address] [place] [address] [phone]       Follow up with Elam Dutch, MD. (If symptoms worsen)    Specialty:  Vascular Surgery   Contact information:   [address] [place] [address] [phone]        The results of significant diagnostics from this hospitalization (including imaging, microbiology, ancillary and  laboratory) are listed below for reference.    Significant Diagnostic Studies: Dg Chest 2 View  12/03/2013   CLINICAL DATA:  Weakness.  EXAM: CHEST  2 VIEW  COMPARISON:  07/31/2013  FINDINGS: The heart size and mediastinal contours are within normal limits. Both lungs are clear. The visualized skeletal structures are unremarkable.  IMPRESSION: No active cardiopulmonary disease.   Electronically Signed   By: Lucienne Capers M.D.   On: 12/03/2013 22:30   Dg Chest Port 1 View  12/11/2013   CLINICAL DATA:  Central line placement.  EXAM: PORTABLE CHEST - 1 VIEW  COMPARISON:  Chest radiograph performed 12/09/2013  FINDINGS: A left IJ line is noted ending about the mid SVC.  The lungs are well-aerated and clear. There is no evidence of focal opacification, pleural effusion or pneumothorax.  The cardiomediastinal silhouette is within normal limits. No acute osseous abnormalities are seen.  IMPRESSION: 1. Left IJ line noted ending about the mid SVC. 2. No acute cardiopulmonary process seen.   Electronically Signed   By: Garald Balding M.D.   On: 12/11/2013 00:20    Microbiology: Recent Results (from the past 240 hour(s))  MRSA PCR SCREENING     Status: None   Collection Time    12/10/13  4:01 AM      Result Value Ref Range Status   MRSA by PCR NEGATIVE  NEGATIVE Final   Comment:            The GeneXpert MRSA Assay (FDA     approved for NASAL specimens     only), is one component of a     comprehensive MRSA colonization     surveillance program. It is not     intended to diagnose MRSA     infection nor to guide or     monitor treatment for     MRSA infections.  URINE CULTURE     Status: None   Collection Time    12/10/13  8:00 AM      Result Value Ref Range Status   Specimen Description URINE, RANDOM   Final   Special Requests add on if possible o/w obtain via I/O cath   Final   Culture  Setup Time     Final   Value: 12/11/2013 03:20     Performed at [place]      Final   Value: >=  100,000 COLONIES/ML     Performed at [facility]     Final   Value: Multiple bacterial morphotypes present, none predominant. Suggest appropriate recollection if clinically indicated.     Performed at [facility]   Report Status 12/12/2013 FINAL   Final  CULTURE, BLOOD (ROUTINE X 2)     Status: None   Collection Time    12/10/13 10:20 AM      Result Value Ref Range Status   Specimen Description BLOOD RIGHT HAND   Final   Special Requests BOTTLES DRAWN AEROBIC ONLY 5CC   Final   Culture  Setup Time     Final   Value: 12/10/2013 16:05     Performed at [facility]   Culture     Final   Value: NO GROWTH 5 DAYS     Performed at [facility]   Report Status 12/16/2013 FINAL   Final  CULTURE, BLOOD (ROUTINE X 2)     Status: None   Collection Time    12/10/13 10:30 AM      Result Value Ref Range Status   Specimen Description BLOOD RIGHT HAND   Final   Special Requests BOTTLES DRAWN AEROBIC ONLY 3CC   Final   Culture  Setup Time     Final   Value: 12/10/2013 16:05     Performed at [facility]   Culture     Final   Value: NO GROWTH 5 DAYS     Performed at [facility]   Report Status 12/16/2013 FINAL   Final     Labs: Basic Metabolic Panel:  Recent Labs Lab 12/11/13 0340 12/12/13 0452 12/13/13 0520 12/14/13 0458 12/16/13 0742  NA 133* 136* 136* 135* 139  K 4.9 3.8 3.4* 3.9 4.7  CL 92* 93* 90* 96 100  CO2 23 25 32 27 24  GLUCOSE 70 109* 95 122* 88  BUN 65* 27* 42* 14 36*  CREATININE 7.86* 4.08* 5.73* 2.66* 5.98*  CALCIUM 8.7 8.7 8.8 8.3* 8.5  PHOS  --   --  5.6* 2.5 4.8*   Liver Function Tests:  Recent Labs Lab 12/13/13 0520 12/14/13 0458 12/16/13 0742  ALBUMIN 2.2* 2.2* 2.3*   No results found for this basename: LIPASE, AMYLASE,  in the last 168 hours No results found for this basename: AMMONIA,  in the last 168 hours CBC:  Recent Labs Lab 12/13/13 0520 12/13/13 1256  12/13/13 1815 12/14/13 0252 12/15/13 0850 12/16/13 0742  WBC 12.0* 9.9  --  13.5* 13.8* 12.2*  HGB 8.8* 8.6*  --  9.2* 8.3* 7.9*  HCT 25.9* 25.2*  --  27.3* 24.0* 23.0*  MCV 84.6 86.9  --  84.3 84.8 85.2  PLT 392 377 380 399 422* 375   Cardiac Enzymes:  Recent Labs Lab 12/10/13 1020 12/10/13 1700  TROPONINI <0.30 <0.30   BNP: BNP (last 3 results) No results found for this basename: PROBNP,  in the last 8760 hours CBG:  Recent Labs Lab 12/15/13 0737 12/15/13 1206 12/15/13 1741 12/15/13 2054 12/17/13 0830  GLUCAP 107* 151* 110* 139* 101*       Signed:  Ivanka Kirshner  [facility] 12/17/2013, 9:49 AM

## 2013-12-17 NOTE — Plan of Care (Signed)
RN paged this NP earlier because daughter was insisting on Rx for Zofran and Percocet for her mom prior to discharge. Daughter also said her mom had not been on metoprolol before and she wasn't going to give it to her.  Explained through RN to daughter that [facility] doesn't given refills on medications the pt was on prior to admission. She will need to call her PCP in am to get further narcotics and Zofran. Also, told daughter Metoprolol was new med added this admission for HTN and hx CAD and her mother needs to take it. Encouraged daughter to call PCP in am and make f/up appt ASAP. Pt d/c by RN. Clance Boll, NP [facility]

## 2013-12-17 NOTE — Progress Notes (Signed)
Discharge instructions and medications discussed with pt with day shift RN. Tele removed. Assessment unchanged from morning. Pt discharged to home with family.

## 2013-12-17 NOTE — Progress Notes (Signed)
Subjective:  No chest pain/ tolerated hd yesterday  , Off schedule sec to Cardiac Procedures with MI  Objective Vital signs in last 24 hours: Filed Vitals:   12/16/13 1652 12/16/13 2221 12/17/13 0503 12/17/13 0919  BP: 125/53 135/48 149/61 128/52  Pulse: 86 85 92 83  Temp: 99.6 F (37.6 C) 99.3 F (37.4 C) 99.3 F (37.4 C) 99.2 F (37.3 C)  TempSrc: Oral Oral Oral Oral  Resp: 17 18 18 16   Height:      Weight:  55.2 kg (121 lb 11.1 oz)    SpO2: 100% 97% 99% 98%   Weight change: 1.476 kg (3 lb 4.1 oz)  Physical Exam: General: elderly thin BF chronically ill . Confused to date, NAD Heart: RRR No mur , rub or gallop Lungs: CTA  GI: + BS, soft and nontender  Extremities: No edema, R big toe without nail, surrounding darkened area  Access: AVG @ LUA with pos, bruit    Dialysis Orders: TTS @[facility]   3hr 45 min 52.5kgs 2K/2Ca 6000Heparin 160 350/1.5 L AVG  calcitriol 0.25 mcg Aranesp 25 q 2 weeks (last 9.1) Venofer 50 q wk  Profile 2    Problem/Plan: 1. CAD/NSTEMI - s/p complex PCI with high-speed rotational atherectomy, PTCA, and DES of 3 high grade lesions in RCA , on Plavix & ASA per Cardiology.  2. ESRD - HD on TTS [facility], off schedule.Short HD today sec to recent MI/ Pt lives with daughter  13. HTN/Volume - BP 128/52 on Amlodipine 10 mg qd, Metoprolol 100 mg bid; wt 55.2 kg, Above  EDW. BUT NEED STAND WT atempt 2 l uf 4. Anemia - Hgb down to7.9 yesterday < 8.3  s/p 2 U PRBCs 9/16, Aranesp 100 mcg on Mon, weekly Fe.ck hgb today 5. Sec HPT - Ca 8.5 (9.9 corrected), P 4.8; Calcitriol 0.25 mcg on TTS, no binders.  6. Nutrition - Alb 2.3, carb-mod renal diet, multivitamin.  7. DM - per primary.  8. Ischemic right great toe- no change/ FU op        9.  Disposition= DC today after hd  Ernest Haber, PA-C [place] [phone] 12/17/2013,10:15 AM  LOS: 7 days   Pt seen, examined, agree w assess/plan as above with additions as indicated. For dc today  after HD. Stable from renal standpoint.  Kelly Splinter MD pager [phone]    cell [phone] 12/17/2013, 4:51 PM     Labs: Basic Metabolic Panel:  Recent Labs Lab 12/13/13 0520 12/14/13 0458 12/16/13 0742  NA 136* 135* 139  K 3.4* 3.9 4.7  CL 90* 96 100  CO2 32 27 24  GLUCOSE 95 122* 88  BUN 42* 14 36*  CREATININE 5.73* 2.66* 5.98*  CALCIUM 8.8 8.3* 8.5  PHOS 5.6* 2.5 4.8*   Liver Function Tests:  Recent Labs Lab 12/13/13 0520 12/14/13 0458 12/16/13 0742  ALBUMIN 2.2* 2.2* 2.3*  CBC:  Recent Labs Lab 12/13/13 0520 12/13/13 1256  12/14/13 0252 12/15/13 0850 12/16/13 0742  WBC 12.0* 9.9  --  13.5* 13.8* 12.2*  HGB 8.8* 8.6*  --  9.2* 8.3* 7.9*  HCT 25.9* 25.2*  --  27.3* 24.0* 23.0*  MCV 84.6 86.9  --  84.3 84.8 85.2  PLT 392 377  < > 399 422* 375  < > = values in this interval not displayed. Cardiac Enzymes:  Recent Labs Lab 12/10/13 1020 12/10/13 1700  TROPONINI <0.30 <0.30   CBG:  Recent Labs Lab 12/15/13 0737  12/15/13 1206 12/15/13 1741 12/15/13 2054 12/17/13 0830  GLUCAP 107* 151* 110* 139* 101*    Studies/Results: No results found. Medications: . sodium chloride 10 mL/hr (12/15/13 1831)   . amLODipine  10 mg Oral Daily  . antiseptic oral rinse  7 mL Mouth Rinse BID  . aspirin EC  81 mg Oral Daily  . atorvastatin  40 mg Oral q1800  . calcitRIOL  0.25 mcg Oral Q T,Th,Sa-HD  . clopidogrel  75 mg Oral Q breakfast  . darbepoetin (ARANESP) injection - DIALYSIS  100 mcg Intravenous Q Mon-HD  . feeding supplement (NEPRO CARB STEADY)  237 mL Oral BID BM  . ferric gluconate (FERRLECIT/NULECIT) IV  62.5 mg Intravenous Weekly  . gabapentin  300 mg Oral BID  . heparin subcutaneous  5,000 Units Subcutaneous 3 times per day  . insulin aspart  0-9 Units Subcutaneous TID WC  . isosorbide mononitrate  60 mg Oral Daily  . metoprolol tartrate  100 mg Oral BID  . multivitamin  1 tablet Oral QHS  . oxyCODONE  10 mg Oral 3 times per day  . sodium  chloride  3 mL Intravenous Q12H

## 2013-12-17 NOTE — Evaluation (Signed)
Occupational Therapy Evaluation Patient Details Name: [name] MRN: [id number] DOB: [birth year] Today's Date: 12/17/2013    History of Present Illness 63yo F w/ Hx chronic kidney disease on dialysis (TTS), vascular dementia ,diabetes, hypertension, and dry gangrene right great toe who presented to the ED at [facility] with a one-day history of central chest pain; After arrival at [facility] she was evaluated by the Cardiology who was concerned for possible non-STEMI. She was taken to cardiac cath on 9/15 which revealed two-vessel coronary artery disease with 50-60% tubular stenosis involving the LAD ostium and proximal segment, with 80-90% mid LAD stenoses, 80% mid-distal LAD stenosis; 80% stenosis in a small branch of the OM 3 vessel; and high-grade RCA stenoses with 85% eccentric proximal, 95%, mid, and 85% mid distal stenoses. After further planning, the pt was taken back to the lab on 9/18 for complex percutaneous artery intervention with high-speed rotational atherectomy, PTCA, and DES stenting of 3 high grade lesions in the RCA with insertion of a 2.75x28 and 2.5x38 mm Promus Premier DES stents post dilated with stent taper from 3.03-2.8 mm.   Clinical Impression   Pt is dependent at baseline in ADL/IADL and mobility. Pt required max assist for stand-pivot transfer to chair. Family has been able to provide adequate care for pt at home per nursing.  Plan is for return home.  No further OT needs.    Follow Up Recommendations  No OT follow up;Supervision/Assistance - 24 hour    Equipment Recommendations  None recommended by OT    Recommendations for Other Services       Precautions / Restrictions Precautions Precautions: Fall Restrictions Weight Bearing Restrictions: No      Mobility Bed Mobility Overal bed mobility: Needs Assistance Bed Mobility: Supine to Sit     Supine to sit: Mod assist        Transfers Overall transfer level: Needs assistance   Transfers: Stand  Pivot Transfers   Stand pivot transfers: Max assist       General transfer comment: Pt took some weight on her feet, but did not attempt to move them to assist with pivot    Balance     Sitting balance-Leahy Scale: Fair       Standing balance-Leahy Scale: Zero                              ADL Overall ADL's : At baseline                                             Vision                     Perception     Praxis      Pertinent Vitals/Pain Pain Assessment: No/denies pain     Hand Dominance Right   Extremity/Trunk Assessment Upper Extremity Assessment Upper Extremity Assessment: Generalized weakness   Lower Extremity Assessment Lower Extremity Assessment: Defer to PT evaluation       Communication Communication Communication: Expressive difficulties   Cognition Arousal/Alertness: Awake/alert Behavior During Therapy: WFL for tasks assessed/performed Overall Cognitive Status: No family/caregiver present to determine baseline cognitive functioning (known vascular dementia)                     General Comments  Exercises       Shoulder Instructions      Home Living Family/patient expects to be discharged to:: Private residence Living Arrangements: Children Available Help at Discharge: Family;Available 24 hours/day Type of Home: Apartment Home Access: Level entry     Home Layout: One level     Bathroom Shower/Tub: Teacher, early years/pre: Standard     Home Equipment: Environmental consultant - 2 wheels;Wheelchair - manual;Shower seat;Hospital bed;Bedside commode   Additional Comments: per pt who is a questionable historian      Prior Functioning/Environment Level of Independence: Needs assistance  Gait / Transfers Assistance Needed: amb with RW ADL's / Homemaking Assistance Needed: assisted by sister for bathing, dressing, can self feed, family performs meal prep and housekeeping Communication /  Swallowing Assistance Needed: slurred speech Comments: Per pt, her daughter and granddaughter assist her with transfers    OT Diagnosis:     OT Problem List:     OT Treatment/Interventions:      OT Goals(Current goals can be found in the care plan section) Acute Rehab OT Goals Patient Stated Goal: return home  OT Frequency:     Barriers to D/C:            Co-evaluation              End of Session Nurse Communication: Mobility status (NT states the family is known to her and they are capable)  Activity Tolerance: Patient tolerated treatment well Patient left: in chair;with call bell/phone within reach;with chair alarm set   Time: 1015-1053 OT Time Calculation (min): 38 min Charges:  OT General Charges $OT Visit: 1 Procedure OT Evaluation $Initial OT Evaluation Tier I: 1 Procedure OT Treatments $Self Care/Home Management : 8-22 mins $Therapeutic Activity: 8-22 mins G-Codes:    Malka So 12/17/2013, 10:54 AM [phone]

## 2014-01-18 ENCOUNTER — Inpatient Hospital Stay (HOSPITAL_COMMUNITY)
Admission: EM | Admit: 2014-01-18 | Discharge: 2014-01-26 | DRG: 871 | Disposition: E | Payer: Medicare Other | Attending: Internal Medicine | Admitting: Internal Medicine

## 2014-01-18 ENCOUNTER — Emergency Department (HOSPITAL_COMMUNITY): Payer: Medicare Other

## 2014-01-18 ENCOUNTER — Encounter (HOSPITAL_COMMUNITY): Payer: Self-pay | Admitting: Emergency Medicine

## 2014-01-18 DIAGNOSIS — Z8249 Family history of ischemic heart disease and other diseases of the circulatory system: Secondary | ICD-10-CM | POA: Diagnosis not present

## 2014-01-18 DIAGNOSIS — R532 Functional quadriplegia: Secondary | ICD-10-CM | POA: Diagnosis present

## 2014-01-18 DIAGNOSIS — A419 Sepsis, unspecified organism: Principal | ICD-10-CM | POA: Diagnosis present

## 2014-01-18 DIAGNOSIS — Z955 Presence of coronary angioplasty implant and graft: Secondary | ICD-10-CM

## 2014-01-18 DIAGNOSIS — R400 Somnolence: Secondary | ICD-10-CM

## 2014-01-18 DIAGNOSIS — I5032 Chronic diastolic (congestive) heart failure: Secondary | ICD-10-CM | POA: Diagnosis present

## 2014-01-18 DIAGNOSIS — A047 Enterocolitis due to Clostridium difficile: Secondary | ICD-10-CM | POA: Diagnosis present

## 2014-01-18 DIAGNOSIS — E860 Dehydration: Secondary | ICD-10-CM | POA: Diagnosis present

## 2014-01-18 DIAGNOSIS — Z515 Encounter for palliative care: Secondary | ICD-10-CM

## 2014-01-18 DIAGNOSIS — I9589 Other hypotension: Secondary | ICD-10-CM | POA: Diagnosis present

## 2014-01-18 DIAGNOSIS — R6520 Severe sepsis without septic shock: Secondary | ICD-10-CM | POA: Diagnosis present

## 2014-01-18 DIAGNOSIS — Z833 Family history of diabetes mellitus: Secondary | ICD-10-CM

## 2014-01-18 DIAGNOSIS — N390 Urinary tract infection, site not specified: Secondary | ICD-10-CM | POA: Diagnosis present

## 2014-01-18 DIAGNOSIS — Z79891 Long term (current) use of opiate analgesic: Secondary | ICD-10-CM | POA: Diagnosis not present

## 2014-01-18 DIAGNOSIS — Z7982 Long term (current) use of aspirin: Secondary | ICD-10-CM | POA: Diagnosis not present

## 2014-01-18 DIAGNOSIS — E785 Hyperlipidemia, unspecified: Secondary | ICD-10-CM | POA: Diagnosis present

## 2014-01-18 DIAGNOSIS — R404 Transient alteration of awareness: Secondary | ICD-10-CM

## 2014-01-18 DIAGNOSIS — R627 Adult failure to thrive: Secondary | ICD-10-CM | POA: Diagnosis present

## 2014-01-18 DIAGNOSIS — I252 Old myocardial infarction: Secondary | ICD-10-CM

## 2014-01-18 DIAGNOSIS — D631 Anemia in chronic kidney disease: Secondary | ICD-10-CM | POA: Diagnosis present

## 2014-01-18 DIAGNOSIS — I6330 Cerebral infarction due to thrombosis of unspecified cerebral artery: Secondary | ICD-10-CM | POA: Insufficient documentation

## 2014-01-18 DIAGNOSIS — Z992 Dependence on renal dialysis: Secondary | ICD-10-CM | POA: Diagnosis not present

## 2014-01-18 DIAGNOSIS — I120 Hypertensive chronic kidney disease with stage 5 chronic kidney disease or end stage renal disease: Secondary | ICD-10-CM | POA: Diagnosis present

## 2014-01-18 DIAGNOSIS — Z681 Body mass index (BMI) 19 or less, adult: Secondary | ICD-10-CM | POA: Diagnosis not present

## 2014-01-18 DIAGNOSIS — G9340 Encephalopathy, unspecified: Secondary | ICD-10-CM | POA: Diagnosis present

## 2014-01-18 DIAGNOSIS — I639 Cerebral infarction, unspecified: Secondary | ICD-10-CM

## 2014-01-18 DIAGNOSIS — I63349 Cerebral infarction due to thrombosis of unspecified cerebellar artery: Secondary | ICD-10-CM | POA: Diagnosis present

## 2014-01-18 DIAGNOSIS — F0150 Vascular dementia without behavioral disturbance: Secondary | ICD-10-CM | POA: Diagnosis present

## 2014-01-18 DIAGNOSIS — E440 Moderate protein-calorie malnutrition: Secondary | ICD-10-CM | POA: Diagnosis present

## 2014-01-18 DIAGNOSIS — I96 Gangrene, not elsewhere classified: Secondary | ICD-10-CM | POA: Diagnosis present

## 2014-01-18 DIAGNOSIS — E119 Type 2 diabetes mellitus without complications: Secondary | ICD-10-CM

## 2014-01-18 DIAGNOSIS — G8929 Other chronic pain: Secondary | ICD-10-CM | POA: Diagnosis present

## 2014-01-18 DIAGNOSIS — I2510 Atherosclerotic heart disease of native coronary artery without angina pectoris: Secondary | ICD-10-CM | POA: Diagnosis present

## 2014-01-18 DIAGNOSIS — K921 Melena: Secondary | ICD-10-CM | POA: Diagnosis not present

## 2014-01-18 DIAGNOSIS — I672 Cerebral atherosclerosis: Secondary | ICD-10-CM | POA: Diagnosis present

## 2014-01-18 DIAGNOSIS — E1122 Type 2 diabetes mellitus with diabetic chronic kidney disease: Secondary | ICD-10-CM

## 2014-01-18 DIAGNOSIS — E162 Hypoglycemia, unspecified: Secondary | ICD-10-CM

## 2014-01-18 DIAGNOSIS — R4182 Altered mental status, unspecified: Secondary | ICD-10-CM

## 2014-01-18 DIAGNOSIS — I739 Peripheral vascular disease, unspecified: Secondary | ICD-10-CM | POA: Diagnosis present

## 2014-01-18 DIAGNOSIS — Z66 Do not resuscitate: Secondary | ICD-10-CM | POA: Diagnosis present

## 2014-01-18 DIAGNOSIS — N184 Chronic kidney disease, stage 4 (severe): Secondary | ICD-10-CM

## 2014-01-18 DIAGNOSIS — I10 Essential (primary) hypertension: Secondary | ICD-10-CM

## 2014-01-18 DIAGNOSIS — Z8673 Personal history of transient ischemic attack (TIA), and cerebral infarction without residual deficits: Secondary | ICD-10-CM

## 2014-01-18 DIAGNOSIS — N186 End stage renal disease: Secondary | ICD-10-CM | POA: Diagnosis present

## 2014-01-18 DIAGNOSIS — D72829 Elevated white blood cell count, unspecified: Secondary | ICD-10-CM

## 2014-01-18 DIAGNOSIS — E11649 Type 2 diabetes mellitus with hypoglycemia without coma: Secondary | ICD-10-CM | POA: Diagnosis present

## 2014-01-18 DIAGNOSIS — N2581 Secondary hyperparathyroidism of renal origin: Secondary | ICD-10-CM | POA: Diagnosis present

## 2014-01-18 DIAGNOSIS — I214 Non-ST elevation (NSTEMI) myocardial infarction: Secondary | ICD-10-CM | POA: Diagnosis present

## 2014-01-18 DIAGNOSIS — R8290 Unspecified abnormal findings in urine: Secondary | ICD-10-CM

## 2014-01-18 DIAGNOSIS — I70219 Atherosclerosis of native arteries of extremities with intermittent claudication, unspecified extremity: Secondary | ICD-10-CM

## 2014-01-18 DIAGNOSIS — F0390 Unspecified dementia without behavioral disturbance: Secondary | ICD-10-CM

## 2014-01-18 DIAGNOSIS — I161 Hypertensive emergency: Secondary | ICD-10-CM | POA: Diagnosis present

## 2014-01-18 LAB — CBG MONITORING, ED
GLUCOSE-CAPILLARY: 64 mg/dL — AB (ref 70–99)
Glucose-Capillary: 36 mg/dL — CL (ref 70–99)
Glucose-Capillary: 138 mg/dL — ABNORMAL HIGH (ref 70–99)
Glucose-Capillary: 94 mg/dL (ref 70–99)
Glucose-Capillary: 93 mg/dL (ref 70–99)

## 2014-01-18 LAB — COMPREHENSIVE METABOLIC PANEL
ALK PHOS: 214 U/L — AB (ref 39–117)
ALT: 14 U/L (ref 0–35)
AST: 35 U/L (ref 0–37)
Albumin: 2.1 g/dL — ABNORMAL LOW (ref 3.5–5.2)
Anion gap: 28 — ABNORMAL HIGH (ref 5–15)
BILIRUBIN TOTAL: 0.2 mg/dL — AB (ref 0.3–1.2)
BUN: 70 mg/dL — AB (ref 6–23)
CO2: 20 mEq/L (ref 19–32)
Calcium: 9.1 mg/dL (ref 8.4–10.5)
Chloride: 89 mEq/L — ABNORMAL LOW (ref 96–112)
Creatinine, Ser: 9.24 mg/dL — ABNORMAL HIGH (ref 0.50–1.10)
GFR, EST AFRICAN AMERICAN: 5 mL/min — AB (ref >90)
GFR, EST NON AFRICAN AMERICAN: 4 mL/min — AB (ref >90)
Glucose, Bld: 171 mg/dL — ABNORMAL HIGH (ref 70–99)
POTASSIUM: 3.7 meq/L (ref 3.7–5.3)
Sodium: 137 mEq/L (ref 137–147)
TOTAL PROTEIN: 7.8 g/dL (ref 6.0–8.3)

## 2014-01-18 LAB — URINALYSIS, ROUTINE W REFLEX MICROSCOPIC
Glucose, UA: NEGATIVE mg/dL
Ketones, ur: NEGATIVE mg/dL
Nitrite: POSITIVE — AB
Protein, ur: 100 mg/dL — AB
SPECIFIC GRAVITY, URINE: 1.022 (ref 1.005–1.030)
UROBILINOGEN UA: 0.2 mg/dL (ref 0.0–1.0)
pH: 7.5 (ref 5.0–8.0)

## 2014-01-18 LAB — CBC WITH DIFFERENTIAL/PLATELET
BASOS ABS: 0 10*3/uL (ref 0.0–0.1)
Basophils Relative: 0 % (ref 0–1)
EOS ABS: 0 10*3/uL (ref 0.0–0.7)
Eosinophils Relative: 0 % (ref 0–5)
HEMATOCRIT: 32.7 % — AB (ref 36.0–46.0)
Hemoglobin: 10.7 g/dL — ABNORMAL LOW (ref 12.0–15.0)
LYMPHS ABS: 1.6 10*3/uL (ref 0.7–4.0)
Lymphocytes Relative: 5 % — ABNORMAL LOW (ref 12–46)
MCH: 27 pg (ref 26.0–34.0)
MCHC: 32.7 g/dL (ref 30.0–36.0)
MCV: 82.6 fL (ref 78.0–100.0)
Monocytes Absolute: 1.6 10*3/uL — ABNORMAL HIGH (ref 0.1–1.0)
Monocytes Relative: 5 % (ref 3–12)
NEUTROS ABS: 28.4 10*3/uL — AB (ref 1.7–7.7)
Neutrophils Relative %: 90 % — ABNORMAL HIGH (ref 43–77)
Platelets: 447 10*3/uL — ABNORMAL HIGH (ref 150–400)
RBC: 3.96 MIL/uL (ref 3.87–5.11)
RDW: 16.3 % — ABNORMAL HIGH (ref 11.5–15.5)
WBC: 31.6 10*3/uL — ABNORMAL HIGH (ref 4.0–10.5)

## 2014-01-18 LAB — POC OCCULT BLOOD, ED: Fecal Occult Bld: POSITIVE — AB

## 2014-01-18 LAB — URINE MICROSCOPIC-ADD ON

## 2014-01-18 LAB — GLUCOSE, CAPILLARY
GLUCOSE-CAPILLARY: 28 mg/dL — AB (ref 70–99)
Glucose-Capillary: 110 mg/dL — ABNORMAL HIGH (ref 70–99)
Glucose-Capillary: 123 mg/dL — ABNORMAL HIGH (ref 70–99)

## 2014-01-18 LAB — TSH: TSH: 1.19 u[IU]/mL (ref 0.350–4.500)

## 2014-01-18 LAB — I-STAT TROPONIN, ED: Troponin i, poc: 0 ng/mL (ref 0.00–0.08)

## 2014-01-18 LAB — TROPONIN I: Troponin I: <0.3 ng/mL (ref <0.30)

## 2014-01-18 LAB — AMMONIA: AMMONIA: 35 umol/L (ref 11–60)

## 2014-01-18 MED ORDER — ONDANSETRON HCL 4 MG PO TABS: 4.0000 mg | ORAL_TABLET | Freq: Four times a day (QID) | ORAL | Status: DC | PRN

## 2014-01-18 MED ORDER — SODIUM CHLORIDE 0.9 % IV SOLN: 100.0000 mL | INTRAVENOUS | Status: DC | PRN

## 2014-01-18 MED ORDER — SODIUM CHLORIDE 0.9 % IJ SOLN
3.0000 mL | Freq: Two times a day (BID) | INTRAMUSCULAR | Status: DC
  Administered 2014-01-18 – 2014-01-20 (×4): 3 mL via INTRAVENOUS

## 2014-01-18 MED ORDER — CEFEPIME HCL 2 G IJ SOLR
2.0000 g | Freq: Once | INTRAMUSCULAR | Status: AC
  Administered 2014-01-18: 2 g via INTRAVENOUS
  Filled 2014-01-18: qty 2

## 2014-01-18 MED ORDER — ALTEPLASE 2 MG IJ SOLR: 2.0000 mg | Freq: Once | INTRAMUSCULAR | Status: AC | PRN

## 2014-01-18 MED ORDER — DEXTROSE 50 % IV SOLN
1.0000 | Freq: Once | INTRAVENOUS | Status: AC
  Administered 2014-01-18: 50 mL via INTRAVENOUS

## 2014-01-18 MED ORDER — DEXTROSE 5 % IV SOLN
5.0000 mg/kg | INTRAVENOUS | Status: DC
  Administered 2014-01-19 – 2014-01-20 (×2): 240 mg via INTRAVENOUS
  Filled 2014-01-18 (×4): qty 4.8

## 2014-01-18 MED ORDER — PENTAFLUOROPROP-TETRAFLUOROETH EX AERO: 1.0000 "application " | INHALATION_SPRAY | CUTANEOUS | Status: DC | PRN

## 2014-01-18 MED ORDER — DEXTROSE 50 % IV SOLN
INTRAVENOUS | Status: AC
  Administered 2014-01-18: 50 mL via INTRAVENOUS
  Filled 2014-01-18: qty 50

## 2014-01-18 MED ORDER — DEXTROSE 50 % IV SOLN
INTRAVENOUS | Status: AC
  Administered 2014-01-18: 50 mL
  Filled 2014-01-18: qty 50

## 2014-01-18 MED ORDER — METOPROLOL TARTRATE 100 MG PO TABS
100.0000 mg | ORAL_TABLET | Freq: Two times a day (BID) | ORAL | Status: DC
  Filled 2014-01-18 (×3): qty 1

## 2014-01-18 MED ORDER — SODIUM CHLORIDE 0.9 % IV SOLN: 250.0000 mL | INTRAVENOUS | Status: DC | PRN

## 2014-01-18 MED ORDER — NA FERRIC GLUC CPLX IN SUCROSE 12.5 MG/ML IV SOLN: 62.5000 mg | INTRAVENOUS | Status: DC

## 2014-01-18 MED ORDER — ACETAMINOPHEN 650 MG RE SUPP
650.0000 mg | Freq: Four times a day (QID) | RECTAL | Status: DC | PRN
  Administered 2014-01-23: 650 mg via RECTAL
  Filled 2014-01-18: qty 1

## 2014-01-18 MED ORDER — VANCOMYCIN HCL IN DEXTROSE 1-5 GM/200ML-% IV SOLN
1000.0000 mg | Freq: Once | INTRAVENOUS | Status: AC
  Administered 2014-01-18: 1000 mg via INTRAVENOUS
  Filled 2014-01-18: qty 200

## 2014-01-18 MED ORDER — LIDOCAINE HCL (PF) 1 % IJ SOLN: 5.0000 mL | INTRAMUSCULAR | Status: DC | PRN

## 2014-01-18 MED ORDER — ALBUTEROL SULFATE (2.5 MG/3ML) 0.083% IN NEBU: 2.5000 mg | INHALATION_SOLUTION | RESPIRATORY_TRACT | Status: DC | PRN

## 2014-01-18 MED ORDER — SIMVASTATIN 20 MG PO TABS
20.0000 mg | ORAL_TABLET | Freq: Every day | ORAL | Status: DC
  Filled 2014-01-18 (×4): qty 1

## 2014-01-18 MED ORDER — LIDOCAINE-PRILOCAINE 2.5-2.5 % EX CREA: 1.0000 "application " | TOPICAL_CREAM | CUTANEOUS | Status: DC | PRN

## 2014-01-18 MED ORDER — ISOSORBIDE MONONITRATE ER 60 MG PO TB24
60.0000 mg | ORAL_TABLET | Freq: Every day | ORAL | Status: DC
  Filled 2014-01-18 (×2): qty 1

## 2014-01-18 MED ORDER — SODIUM CHLORIDE 0.9 % IJ SOLN: 3.0000 mL | INTRAMUSCULAR | Status: DC | PRN

## 2014-01-18 MED ORDER — CALCITRIOL 0.25 MCG PO CAPS
0.2500 ug | ORAL_CAPSULE | ORAL | Status: DC
  Filled 2014-01-18: qty 1

## 2014-01-18 MED ORDER — HEPARIN SODIUM (PORCINE) 5000 UNIT/ML IJ SOLN
5000.0000 [IU] | Freq: Three times a day (TID) | INTRAMUSCULAR | Status: DC
  Administered 2014-01-18 – 2014-01-23 (×14): 5000 [IU] via SUBCUTANEOUS
  Filled 2014-01-18 (×17): qty 1

## 2014-01-18 MED ORDER — NEPRO/CARBSTEADY PO LIQD
237.0000 mL | ORAL | Status: DC | PRN
  Filled 2014-01-18: qty 237

## 2014-01-18 MED ORDER — GUAIFENESIN-DM 100-10 MG/5ML PO SYRP: 5.0000 mL | ORAL_SOLUTION | ORAL | Status: DC | PRN

## 2014-01-18 MED ORDER — DARBEPOETIN ALFA-POLYSORBATE 200 MCG/0.4ML IJ SOLN
200.0000 ug | INTRAMUSCULAR | Status: DC
  Filled 2014-01-18: qty 0.4

## 2014-01-18 MED ORDER — NALOXONE HCL 0.4 MG/ML IJ SOLN
0.4000 mg | Freq: Once | INTRAMUSCULAR | Status: AC
  Administered 2014-01-18: 0.4 mg via INTRAVENOUS
  Filled 2014-01-18: qty 1

## 2014-01-18 MED ORDER — ONDANSETRON HCL 4 MG/2ML IJ SOLN: 4.0000 mg | Freq: Four times a day (QID) | INTRAMUSCULAR | Status: DC | PRN

## 2014-01-18 MED ORDER — CLOPIDOGREL BISULFATE 75 MG PO TABS
75.0000 mg | ORAL_TABLET | Freq: Every day | ORAL | Status: DC
  Filled 2014-01-18 (×5): qty 1

## 2014-01-18 MED ORDER — DARBEPOETIN ALFA-POLYSORBATE 100 MCG/0.5ML IJ SOLN: 100.0000 ug | INTRAMUSCULAR | Status: DC

## 2014-01-18 MED ORDER — RENA-VITE PO TABS
1.0000 | ORAL_TABLET | Freq: Every day | ORAL | Status: DC
  Filled 2014-01-18: qty 1

## 2014-01-18 MED ORDER — DEXTROSE 50 % IV SOLN
INTRAVENOUS | Status: AC
  Filled 2014-01-18: qty 50

## 2014-01-18 MED ORDER — ASPIRIN EC 81 MG PO TBEC
81.0000 mg | DELAYED_RELEASE_TABLET | Freq: Every day | ORAL | Status: DC
  Filled 2014-01-18 (×5): qty 1

## 2014-01-18 MED ORDER — ACETAMINOPHEN 325 MG PO TABS: 650.0000 mg | ORAL_TABLET | Freq: Four times a day (QID) | ORAL | Status: DC | PRN

## 2014-01-18 MED ORDER — DEXTROSE 50 % IV SOLN
50.0000 mL | Freq: Once | INTRAVENOUS | Status: AC | PRN
  Filled 2014-01-18: qty 50

## 2014-01-18 MED ORDER — VANCOMYCIN HCL IN DEXTROSE 750-5 MG/150ML-% IV SOLN
750.0000 mg | Freq: Once | INTRAVENOUS | Status: DC
Start: 2014-01-18 — End: 2014-01-18
  Filled 2014-01-18: qty 150

## 2014-01-18 MED ORDER — RENA-VITE PO TABS
1.0000 | ORAL_TABLET | Freq: Every day | ORAL | Status: DC
  Filled 2014-01-18 (×2): qty 1

## 2014-01-18 MED ORDER — NEPRO/CARBSTEADY PO LIQD
237.0000 mL | Freq: Two times a day (BID) | ORAL | Status: DC
  Filled 2014-01-18 (×4): qty 237

## 2014-01-18 MED ORDER — HEPARIN SODIUM (PORCINE) 1000 UNIT/ML DIALYSIS
1000.0000 [IU] | INTRAMUSCULAR | Status: DC | PRN
  Filled 2014-01-18: qty 1

## 2014-01-18 MED ORDER — AMLODIPINE BESYLATE 10 MG PO TABS
10.0000 mg | ORAL_TABLET | Freq: Every day | ORAL | Status: DC
  Filled 2014-01-18 (×2): qty 1

## 2014-01-18 MED ORDER — ACYCLOVIR SODIUM 50 MG/ML IV SOLN
240.0000 mg | Freq: Once | INTRAVENOUS | Status: AC
  Administered 2014-01-18: 240 mg via INTRAVENOUS
  Filled 2014-01-18: qty 4.8

## 2014-01-18 NOTE — ED Notes (Signed)
Report given to hemodialysis,

## 2014-01-18 NOTE — ED Notes (Signed)
Pt

## 2014-01-18 NOTE — Procedures (Signed)
I was present at this dialysis session, have reviewed the session itself and made  appropriate changes  Kelly Splinter MD (pgr) [phone]    ([phone] 01/01/2014, 4:43 PM

## 2014-01-18 NOTE — ED Notes (Signed)
Performed in and out cath

## 2014-01-18 NOTE — Consult Note (Addendum)
Indication for Consultation:  Management of ESRD/hemodialysis; anemia, hypertension/volume and secondary hyperparathyroidism  HPI: [name] is a 63 y.o. female who presented to the ED this am for evaluation of weakness. She recives HD TTS @ [facility], did not have treatment on Thursday because her daughter said she was too weak. Per daughter, whom she lives with, pt began to have progressive weakness since Tuesday and has not been eating well. She reports this AM her blood pressure and blood sugar were both very low, though she cant tell me what the readings were. Pt is alerts but is not responding to questions or following commands. Per the daughter, pt is usually more alert and oriented however she is unable to take care of herself. Daughter states pt had been taking her oxycodone 5mg  TID until last night but none this AM, she denies pt getting any additional doses. She has been having diarrhea since yesterday. Will arrange for HD today.   Chart review: 2/21- 05/27/13 -- N/V for 3 days prior to admission, couldn't get to HD for a week d/t transportation issues, hx per pt's daughter, confused, ESRD pt , creat 11, BUN 70, glu 741 > metabolic encephalopathy d/t uremia, HCAP w LLL infiltrate rx w IV abx. Haldol for agitation. DM on insulin, ESRD on HD, HTN. Ischemic changes R foot, Vasc surgery consulted , showed severe PVD on R, f/u OP w [facility].  3/9- 06/07/13 -- AMS for 2-3 days, just dc'd for similar problems, hx bipolar d/o recently started on Haldol, pt "unconscious" and not talking for 3 days > CT showed multiple infarcts undetermined age, MRI acute CVA R lat thalamus. Multiple remote lacunar CVA's bilat BG/thalami. 1-39% stenosis bilat carotids. TTE EF 55%. BP high rx with medication. DC'd back to SNF stable condition 9/15- 12/17/13 -- chest pain x 1 day > admitted, HTN urgency, CP resolved with O2, rx of high BP, hep gtt and NTG gtt. Heart cath showed 2V CAD of RCA > LAD (LAD 50-80% at multiple areas);  she had difficult PCI to RCA, per cards needs DAP indefinitely. ESRD, R great toe gangrene seen by [facility] > arteriogram w severe bilt pop/TA disease, not a candidate for surgical bypass due to comorbitidities, would need amputation if gangrene progresses.  Vascular dementia.  DM on insulin  Past Medical History  Diagnosis Date  . Diabetes mellitus   . Hypertension   . CHF (congestive heart failure)   . Hyperlipidemia   . End-stage renal disease on hemodialysis     Started dialysis in late 2014. Gets HD in [place] on TTS schedule.     . Thyroid disease   . Depression   . Shortness of breath     at night  . Arthritis   . Dementia   . Stroke     2013, weakness- all over  . Constipation   . Vision disturbance   . End stage renal disease 03/30/2011  . Coronary artery disease    Past Surgical History  Procedure Laterality Date  . Arteriovenous graft placement      left upper extremity  . Tubal ligation      x2  . Av fistula placement Left 07/02/2012    Procedure: ARTERIOVENOUS (AV) FISTULA CREATION;  Surgeon: Conrad Thendara, MD;  Location: [facility];  Service: Vascular;  Laterality: Left;  . Av fistula placement Left 10/01/2012    Procedure: INSERTION OF ARTERIOVENOUS (AV) GORE-TEX GRAFT LEFT UPPER ARM;  Surgeon: Conrad [place] Ridge, MD;  Location: [facility]  OR;  Service: Vascular;  Laterality: Left;  Ultrasound guided   Family History  Problem Relation Age of Onset  . Diabetes Father   . Stroke Father   . Heart disease Mother    Social History:  reports that she has never smoked. She has never used smokeless tobacco. She reports that she does not drink alcohol or use illicit drugs. Allergies  Allergen Reactions  . Phenergan [Promethazine Hcl]     unknown   Prior to Admission medications   Medication Sig Start Date End Date Taking? Authorizing Provider  amLODipine (NORVASC) 10 MG tablet Take 1 tablet (10 mg total) by mouth daily. 06/07/13   Kelvin Cellar, MD  aspirin EC 81 MG EC tablet Take 1 tablet  (81 mg total) by mouth daily. 12/17/13   Geradine Girt, DO  calcitRIOL (ROCALTROL) 0.25 MCG capsule Take 1 capsule (0.25 mcg total) by mouth Every Tuesday,Thursday,and Saturday with dialysis. 12/17/13   Geradine Girt, DO  clopidogrel (PLAVIX) 75 MG tablet Take 1 tablet (75 mg total) by mouth daily with breakfast. 09/19/13   Elam Dutch, MD  darbepoetin [facility]) 100 MCG/0.5ML SOLN injection Inject 0.5 mLs (100 mcg total) into the vein every Monday with hemodialysis. 12/17/13   Geradine Girt, DO  gabapentin (NEURONTIN) 300 MG capsule Take 1 capsule (300 mg total) by mouth 2 (two) times daily. 12/17/13   Geradine Girt, DO  isosorbide mononitrate (IMDUR) 60 MG 24 hr tablet Take 1 tablet (60 mg total) by mouth daily. 12/17/13   Geradine Girt, DO  metoprolol (LOPRESSOR) 100 MG tablet Take 1 tablet (100 mg total) by mouth 2 (two) times daily. 12/17/13   Geradine Girt, DO  multivitamin (RENA-VIT) TABS tablet Take 1 tablet by mouth at bedtime. 05/24/13   Maryann Mikhail, DO  Nutritional Supplements (FEEDING SUPPLEMENT, NEPRO CARB STEADY,) LIQD Take 237 mLs by mouth 2 (two) times daily between meals. 12/17/13   Geradine Girt, DO  oxycodone (OXY-IR) 5 MG capsule Take 1 capsule (5 mg total) by mouth 3 (three) times daily. 11/04/13   Serafina Mitchell, MD  simvastatin (ZOCOR) 20 MG tablet Take 1 tablet (20 mg total) by mouth daily at 6 PM. 06/05/13   Debbe Odea, MD  ZOFRAN 4 MG tablet Take 4 mg by mouth every 8 (eight) hours as needed for nausea or vomiting.  06/25/13   Historical Provider, MD   Current Facility-Administered Medications  Medication Dose Route Frequency Provider Last Rate Last Dose  . acyclovir (ZOVIRAX) 240 mg in dextrose 5 % 100 mL IVPB  240 mg Intravenous Once Lauren Bajbus, RPH      . [START ON 01/19/2014] acyclovir (ZOVIRAX) 240 mg in dextrose 5 % 100 mL IVPB  5 mg/kg (Ideal) Intravenous Q24H Lauren Bajbus, RPH      . vancomycin (VANCOCIN) IVPB 1000 mg/200 mL premix  1,000 mg Intravenous  Once Lauren Bajbus, RPH 200 mL/hr at 01/10/2014 1423 1,000 mg at 01/03/2014 1423   Current Outpatient Prescriptions  Medication Sig Dispense Refill  . amLODipine (NORVASC) 10 MG tablet Take 1 tablet (10 mg total) by mouth daily.  30 tablet  1  . aspirin EC 81 MG EC tablet Take 1 tablet (81 mg total) by mouth daily.  30 tablet  0  . calcitRIOL (ROCALTROL) 0.25 MCG capsule Take 1 capsule (0.25 mcg total) by mouth Every Tuesday,Thursday,and Saturday with dialysis.  15 capsule  0  . clopidogrel (PLAVIX) 75 MG tablet Take 1 tablet (75  mg total) by mouth daily with breakfast.  30 tablet  6  . darbepoetin (ARANESP) 100 MCG/0.5ML SOLN injection Inject 0.5 mLs (100 mcg total) into the vein every Monday with hemodialysis.  4.2 mL    . gabapentin (NEURONTIN) 300 MG capsule Take 1 capsule (300 mg total) by mouth 2 (two) times daily.  60 capsule  0  . isosorbide mononitrate (IMDUR) 60 MG 24 hr tablet Take 1 tablet (60 mg total) by mouth daily.  30 tablet  0  . metoprolol (LOPRESSOR) 100 MG tablet Take 1 tablet (100 mg total) by mouth 2 (two) times daily.  60 tablet  0  . multivitamin (RENA-VIT) TABS tablet Take 1 tablet by mouth at bedtime.  30 tablet  0  . Nutritional Supplements (FEEDING SUPPLEMENT, NEPRO CARB STEADY,) LIQD Take 237 mLs by mouth 2 (two) times daily between meals.  12 Can  12  . oxycodone (OXY-IR) 5 MG capsule Take 1 capsule (5 mg total) by mouth 3 (three) times daily.  30 capsule  0  . simvastatin (ZOCOR) 20 MG tablet Take 1 tablet (20 mg total) by mouth daily at 6 PM.  30 tablet  0  . ZOFRAN 4 MG tablet Take 4 mg by mouth every 8 (eight) hours as needed for nausea or vomiting.        Labs: Basic Metabolic Panel:  Recent Labs Lab 12/27/2013 1035  NA 137  K 3.7  CL 89*  CO2 20  GLUCOSE 171*  BUN 70*  CREATININE 9.24*  CALCIUM 9.1   Liver Function Tests:  Recent Labs Lab 01/16/2014 1035  AST 35  ALT 14  ALKPHOS 214*  BILITOT 0.2*  PROT 7.8  ALBUMIN 2.1*   No results found for  this basename: LIPASE, AMYLASE,  in the last 168 hours  Recent Labs Lab 12/30/2013 1245  AMMONIA 35   CBC:  Recent Labs Lab  1035  WBC 31.6*  NEUTROABS 28.4*  HGB 10.7*  HCT 32.7*  MCV 82.6  PLT 447*   Cardiac Enzymes: No results found for this basename: CKTOTAL, CKMB, CKMBINDEX, TROPONINI,  in the last 168 hours CBG:  Recent Labs Lab 01/16/2014 1015 01/09/2014 1106 12/31/2013 1206  GLUCAP 36* 138* 94   Iron Studies: No results found for this basename: IRON, TIBC, TRANSFERRIN, FERRITIN,  in the last 72 hours Studies/Results: Ct Head Wo Contrast  12/27/2013   CLINICAL DATA:  Altered mental status. Unresponsive patient. History of hypertension, diabetes and congestive heart failure. History of kidney disease.  EXAM: CT HEAD WITHOUT CONTRAST  TECHNIQUE: Contiguous axial images were obtained from the base of the skull through the vertex without intravenous contrast.  COMPARISON:  06/18/2013.  FINDINGS: Ventricles are normal configuration. There is ventricular and sulcal enlargement reflecting moderate atrophy, advanced for this patient's age.  No parenchymal masses or mass effect. No evidence of a recent cortical infarct.  There multiple small foci of hypoattenuation in the basal ganglia and white matter consistent with old lacune infarcts. Patchy white matter hypoattenuation seen more diffusely is consistent with moderate chronic microvascular ischemic change.  No extra-axial masses or abnormal fluid collections.  There is no intracranial hemorrhage.  Moderate left sphenoid sinus mucosal thickening. Remaining visualized sinuses and mastoid air cells are clear. No skull lesion.  IMPRESSION: 1. No acute intracranial abnormalities. 2. Moderate atrophy, advanced for age. Moderate chronic microvascular ischemic change. Small old lacunar infarcts.   Electronically Signed   By: Lajean Manes M.D.   On: 01/14/2014 11:32  Dg Chest Portable 1 View  01/08/2014   CLINICAL DATA:  63 year old  female with altered mental status. Shortness of breath. End-stage renal disease. History of congestive heart failure.  EXAM: PORTABLE CHEST - 1 VIEW  COMPARISON:  Chest x-ray 12/10/2013.  FINDINGS: Lung volumes are low. No consolidative airspace disease. No pleural effusions. Mild crowding of the pulmonary vasculature, accentuated by low lung volumes, without frank pulmonary edema. Atherosclerosis in the thoracic aorta. Heart size is normal. The patient is rotated to the right on today's exam, resulting in distortion of the mediastinal contours and reduced diagnostic sensitivity and specificity for mediastinal pathology.  IMPRESSION: 1. Low lung volumes without radiographic evidence of acute cardiopulmonary disease. 2. Atherosclerosis.   Electronically Signed   By: Vinnie Langton M.D.   On: 01/23/2014 11:40     Review of Systems: Unable to obtain  Per daughter- weakness and decreased appetite    Physical Exam: Filed Vitals:   12/29/2013 1245 01/23/2014 1315 01/17/2014 1345 01/12/2014 1430  BP: 129/66 151/84 130/87 126/82  Pulse:   51   Temp:      TempSrc:      Resp: 17 21 18 14   Height:  5\' 1"  (1.549 m)    Weight:  54 kg (119 lb 0.8 oz)    SpO2:   62%      General: Frail, confused. No acute distress Head: Normocephalic, atraumatic, sclera non-icteric, mucus membranes are moist Neck: Supple. JVD not elevated. No carotid bruits Lungs: shallow, dim Heart: tachy Abdomen: Soft, non-tender, non-distended with normoactive bowel sounds. No rebound/guarding. No obvious abdominal masses. M-S:  Appears weak Lower extremities: contracted, tender. No edema Neuro: Oriented to self  Psych:  Opens eyes and makes eye contact. Requires much prompting to get any verbal response. Dialysis Access: L AVG +b/t  Dialysis Orders: TTS @ [facility] 3hr 45  46.5kg 160   350/1.5  2K/2Ca  Profile 2   No heparin Aranesp 200 q week    venofer 50 q week    Calcitriol 0.25   Assessment/Plan: 1.  AMS- WBC 13.6 Vanc and  cefepime. But did respond to narcan per notes. Blood and urine cultures pending. Chest xray clear. Head CT pending.  2. Diarrhea- Cdiff pending 3. Fecal occult blood + no heparin in HD 4. Hypoglycemia- poor appetite and Daughter says pt is on insulin- though not listed on medlist- once daily, unsure of dose- seems she got last night 5.  ESRD -  TTS @ [facility], missed Thursday. Pending today. K+ 3.7 6.  Hypertension/volume  - 126/82. Home norvasc and metop 50 BID. Per daughter BP was lower than normal this AM- edw was lowered to 46.5 kgs from 50kgs last week 7.  Anemia  - hgb 10.7. cont ESA and Fe q Tuesday 8.  Metabolic bone disease -  corrected Ca+ 10.6 - hold calcitriol and phoslo 9.  Nutrition - NPO. Alb 2.1 renal vit 10. PVD- R toe gangrene, not a surgical candidate. Oxycodone for chronic pain 11. Dispo- requires 24 hr care at home. Not a good HD candidate.   Shelle Iron, NP [facility] [phone] 12/31/2013, 2:40 PM   Pt seen, examined and agree w A/P as above.  Kelly Splinter MD pager [phone]    cell [phone] 01/16/2014, 4:43 PM

## 2014-01-18 NOTE — Progress Notes (Addendum)
ANTIBIOTIC CONSULT NOTE - INITIAL  Pharmacy Consult for cefepime Indication: rule out sepsis  Allergies  Allergen Reactions  . Phenergan [Promethazine Hcl]     unknown    Patient Measurements: Height: 5\' 1"  (154.9 cm) Weight: 119 lb 0.8 oz (54 kg) IBW/kg (Calculated) : 47.8   Vital Signs: Temp: 98.1 F (36.7 C) (10/24 1030) Temp Source: Rectal (10/24 1030) BP: 151/84 mmHg (10/24 1315) Pulse Rate: 87 (10/24 1215) Intake/Output from previous day:   Intake/Output from this shift:    Labs:  Recent Labs  01/14/2014 1035  WBC 31.6*  HGB 10.7*  PLT 447*  CREATININE 9.24*   Estimated Creatinine Clearance: 4.8 ml/min (by C-G formula based on Cr of 9.24). No results found for this basename: VANCOTROUGH, VANCOPEAK, VANCORANDOM, GENTTROUGH, GENTPEAK, GENTRANDOM, TOBRATROUGH, TOBRAPEAK, TOBRARND, AMIKACINPEAK, AMIKACINTROU, AMIKACIN,  in the last 72 hours   Microbiology: No results found for this or any previous visit (from the past 720 hour(s)).  Medical History: Past Medical History  Diagnosis Date  . Diabetes mellitus   . Hypertension   . CHF (congestive heart failure)   . Hyperlipidemia   . End-stage renal disease on hemodialysis     Started dialysis in late 2014. Gets HD in [place] on TTS schedule.     . Thyroid disease   . Depression   . Shortness of breath     at night  . Arthritis   . Dementia   . Stroke     2013, weakness- all over  . Constipation   . Vision disturbance   . End stage renal disease 03/30/2011  . Coronary artery disease     Assessment: 34 YOF with ESRD on HD TTS. WBC sig elevated at 31.6, currently afebrile. Vancomycin 750mg  IV x1 ordered by ED.  Goal of Therapy:  Pre-HD Vancomycin level 15-25 mcg/ml  Plan:  1. Cefepime 2g IV x1 2. Change vancomycin to 1000mg  IV for 20mg /kg dosing 3. Acyclovir 240mg  IV q24h- 5mg /kg of IBW q24h for HD dosing- to be given after dialysis on HD days 4. Follow up HD schedule for maintenance dosing of  vanc and cefepime  Selassie Spatafore D. Kimbly Eanes, PharmD, BCPS Clinical Pharmacist Pager: [phone] 12/30/2013 1:59 PM

## 2014-01-18 NOTE — ED Notes (Signed)
Internal Medicine at the bedside.

## 2014-01-18 NOTE — ED Provider Notes (Signed)
I saw and evaluated the patient, reviewed the resident's note and I agree with the findings and plan.   EKG Interpretation   Date/Time:  Saturday January 18 2014 10:12:16 EDT Ventricular Rate:  89 PR Interval:  146 QRS Duration: 90 QT Interval:  362 QTC Calculation: 440 R Axis:   36 Text Interpretation:  Sinus rhythm Ventricular premature complex Probable  left atrial enlargement Anteroseptal infarct, old Borderline  repolarization abnormality No significant change since last tracing  Confirmed by Shalom Mcguiness  MD, Odes Lolli ([id number]) on 01/12/2014 12:15:49 PM      Patient here with weakness and low blood sugar. Patients symptoms began this morning. She was given IV glucose here and it improved. She has a moderate leukocytosis at this time. Repeat blood sugar stable. Does have guaiac positive stools. She also missed dialysis this week. No evidence of fluid overload. Head CT without acute findings. Will be admitted to medicine  Leota Jacobsen, MD 12/31/2013 1220

## 2014-01-18 NOTE — ED Notes (Signed)
Patient returned from CT

## 2014-01-18 NOTE — ED Notes (Signed)
Portable  Xray at the bedside.

## 2014-01-18 NOTE — ED Notes (Signed)
Phlebotomy, Amber at the bedside.

## 2014-01-18 NOTE — ED Provider Notes (Cosign Needed)
CSN: [id number]     Arrival date & time 01/02/2014  1011 History   First MD Initiated Contact with Patient 01/14/2014 1014     No chief complaint on file.    (Consider location/radiation/quality/duration/timing/severity/associated sxs/prior Treatment) Patient is a 63 y.o. female presenting with altered mental status. The history is provided by the EMS personnel. No language interpreter was used.  Altered Mental Status Presenting symptoms: confusion and partial responsiveness   Severity:  Severe Most recent episode:  Today Episode history:  Single Timing:  Constant Progression:  Worsening Chronicity:  New Context: not a recent illness   Context comment:  Hypoglycemia   Past Medical History  Diagnosis Date  . Diabetes mellitus   . Hypertension   . CHF (congestive heart failure)   . Hyperlipidemia   . End-stage renal disease on hemodialysis     Started dialysis in late 2014. Gets HD in [place] on TTS schedule.     . Thyroid disease   . Depression   . Shortness of breath     at night  . Arthritis   . Dementia   . Stroke     2013, weakness- all over  . Constipation   . Vision disturbance   . End stage renal disease 03/30/2011  . Coronary artery disease    Past Surgical History  Procedure Laterality Date  . Arteriovenous graft placement      left upper extremity  . Tubal ligation      x2  . Av fistula placement Left 07/02/2012    Procedure: ARTERIOVENOUS (AV) FISTULA CREATION;  Surgeon: Conrad Holgate, MD;  Location: [facility];  Service: Vascular;  Laterality: Left;  . Av fistula placement Left 10/01/2012    Procedure: INSERTION OF ARTERIOVENOUS (AV) GORE-TEX GRAFT LEFT UPPER ARM;  Surgeon: Conrad , MD;  Location: [facility];  Service: Vascular;  Laterality: Left;  Ultrasound guided   Family History  Problem Relation Age of Onset  . Diabetes Father   . Stroke Father   . Heart disease Mother    History  Substance Use Topics  . Smoking status: Never Smoker   . Smokeless  tobacco: Never Used  . Alcohol Use: No   OB History   Grav Para Term Preterm Abortions TAB SAB Ect Mult Living                 Review of Systems  Unable to perform ROS: Mental status change  Psychiatric/Behavioral: Positive for confusion.      Allergies  Phenergan  Home Medications   Prior to Admission medications   Medication Sig Start Date End Date Taking? Authorizing Provider  amLODipine (NORVASC) 10 MG tablet Take 1 tablet (10 mg total) by mouth daily. 06/07/13   Kelvin Cellar, MD  aspirin EC 81 MG EC tablet Take 1 tablet (81 mg total) by mouth daily. 12/17/13   Geradine Girt, DO  calcitRIOL (ROCALTROL) 0.25 MCG capsule Take 1 capsule (0.25 mcg total) by mouth Every Tuesday,Thursday,and Saturday with dialysis. 12/17/13   Geradine Girt, DO  clopidogrel (PLAVIX) 75 MG tablet Take 1 tablet (75 mg total) by mouth daily with breakfast. 09/19/13   Elam Dutch, MD  darbepoetin [facility]) 100 MCG/0.5ML SOLN injection Inject 0.5 mLs (100 mcg total) into the vein every Monday with hemodialysis. 12/17/13   Geradine Girt, DO  gabapentin (NEURONTIN) 300 MG capsule Take 1 capsule (300 mg total) by mouth 2 (two) times daily. 12/17/13   Geradine Girt, DO  isosorbide mononitrate (IMDUR) 60 MG 24 hr tablet Take 1 tablet (60 mg total) by mouth daily. 12/17/13   Geradine Girt, DO  metoprolol (LOPRESSOR) 100 MG tablet Take 1 tablet (100 mg total) by mouth 2 (two) times daily. 12/17/13   Geradine Girt, DO  multivitamin (RENA-VIT) TABS tablet Take 1 tablet by mouth at bedtime. 05/24/13   Maryann Mikhail, DO  Nutritional Supplements (FEEDING SUPPLEMENT, NEPRO CARB STEADY,) LIQD Take 237 mLs by mouth 2 (two) times daily between meals. 12/17/13   Geradine Girt, DO  oxycodone (OXY-IR) 5 MG capsule Take 1 capsule (5 mg total) by mouth 3 (three) times daily. 11/04/13   Serafina Mitchell, MD  simvastatin (ZOCOR) 20 MG tablet Take 1 tablet (20 mg total) by mouth daily at 6 PM. 06/05/13   Debbe Odea, MD   ZOFRAN 4 MG tablet Take 4 mg by mouth every 8 (eight) hours as needed for nausea or vomiting.  06/25/13   Historical Provider, MD   BP 109/63  Pulse 111  Temp(Src) 99.3 F (37.4 C) (Oral)  Resp 8  Ht _0  (1.549 m)  Wt 119 lb 0.8 oz (54 kg)  BMI 22.51 kg/m2  SpO2 100% Physical Exam  Nursing note and vitals reviewed. Constitutional: She appears well-developed and well-nourished.  HENT:  Head: Normocephalic and atraumatic.  Right Ear: External ear normal.  Left Ear: External ear normal.  Eyes: EOM are normal.  Neck: Normal range of motion.  Cardiovascular: Normal rate, regular rhythm and intact distal pulses.  Exam reveals no gallop and no friction rub.   No murmur heard. Pulmonary/Chest: Effort normal and breath sounds normal. No respiratory distress. She has no wheezes. She has no rales. She exhibits no tenderness.  Abdominal: Soft. Bowel sounds are normal. She exhibits no distension. There is no tenderness. There is no rebound.  Musculoskeletal: Normal range of motion. She exhibits no edema and no tenderness.  Lymphadenopathy:    She has no cervical adenopathy.  Neurological: She is alert.  oriented x2  Skin: Skin is warm. No rash noted.  Psychiatric: She has a normal mood and affect. Her behavior is normal.    ED Course  Procedures (including critical care time) Labs Review Labs Reviewed  URINALYSIS, ROUTINE W REFLEX MICROSCOPIC - Abnormal; Notable for the following:    APPearance CLOUDY (*)    Hgb urine dipstick LARGE (*)    Bilirubin Urine SMALL (*)    Protein, ur 100 (*)    Nitrite POSITIVE (*)    Leukocytes, UA MODERATE (*)    All other components within normal limits  COMPREHENSIVE METABOLIC PANEL - Abnormal; Notable for the following:    Chloride 89 (*)    Glucose, Bld 171 (*)    BUN 70 (*)    Creatinine, Ser 9.24 (*)    Albumin 2.1 (*)    Alkaline Phosphatase 214 (*)    Total Bilirubin 0.2 (*)    GFR calc non Af Amer 4 (*)    GFR calc Af Amer 5 (*)     Anion gap 28 (*)    All other components within normal limits  CBC WITH DIFFERENTIAL - Abnormal; Notable for the following:    WBC 31.6 (*)    Hemoglobin 10.7 (*)    HCT 32.7 (*)    RDW 16.3 (*)    Platelets 447 (*)    Neutrophils Relative % 90 (*)    Lymphocytes Relative 5 (*)    Neutro Abs 28.4 (*)  Monocytes Absolute 1.6 (*)    All other components within normal limits  URINE MICROSCOPIC-ADD ON - Abnormal; Notable for the following:    Squamous Epithelial / LPF FEW (*)    Bacteria, UA MANY (*)    All other components within normal limits  CBG MONITORING, ED - Abnormal; Notable for the following:    Glucose-Capillary 36 (*)    All other components within normal limits  CBG MONITORING, ED - Abnormal; Notable for the following:    Glucose-Capillary 138 (*)    All other components within normal limits  POC OCCULT BLOOD, ED - Abnormal; Notable for the following:    Fecal Occult Bld POSITIVE (*)    All other components within normal limits  CULTURE, BLOOD (ROUTINE X 2)  CULTURE, BLOOD (ROUTINE X 2)  URINE CULTURE  CLOSTRIDIUM DIFFICILE BY PCR  TSH  AMMONIA  OCCULT BLOOD X 1 CARD TO LAB, STOOL  I-STAT TROPOININ, ED  CBG MONITORING, ED    Imaging Review Ct Head Wo Contrast  12/26/2013   CLINICAL DATA:  Altered mental status. Unresponsive patient. History of hypertension, diabetes and congestive heart failure. History of kidney disease.  EXAM: CT HEAD WITHOUT CONTRAST  TECHNIQUE: Contiguous axial images were obtained from the base of the skull through the vertex without intravenous contrast.  COMPARISON:  06/18/2013.  FINDINGS: Ventricles are normal configuration. There is ventricular and sulcal enlargement reflecting moderate atrophy, advanced for this patient's age.  No parenchymal masses or mass effect. No evidence of a recent cortical infarct.  There multiple small foci of hypoattenuation in the basal ganglia and white matter consistent with old lacune infarcts. Patchy white  matter hypoattenuation seen more diffusely is consistent with moderate chronic microvascular ischemic change.  No extra-axial masses or abnormal fluid collections.  There is no intracranial hemorrhage.  Moderate left sphenoid sinus mucosal thickening. Remaining visualized sinuses and mastoid air cells are clear. No skull lesion.  IMPRESSION: 1. No acute intracranial abnormalities. 2. Moderate atrophy, advanced for age. Moderate chronic microvascular ischemic change. Small old lacunar infarcts.   Electronically Signed   By: Lajean Manes M.D.   On: 12/30/2013 11:32   Dg Chest Portable 1 View  01/12/2014   CLINICAL DATA:  63 year old female with altered mental status. Shortness of breath. End-stage renal disease. History of congestive heart failure.  EXAM: PORTABLE CHEST - 1 VIEW  COMPARISON:  Chest x-ray 12/10/2013.  FINDINGS: Lung volumes are low. No consolidative airspace disease. No pleural effusions. Mild crowding of the pulmonary vasculature, accentuated by low lung volumes, without frank pulmonary edema. Atherosclerosis in the thoracic aorta. Heart size is normal. The patient is rotated to the right on today's exam, resulting in distortion of the mediastinal contours and reduced diagnostic sensitivity and specificity for mediastinal pathology.  IMPRESSION: 1. Low lung volumes without radiographic evidence of acute cardiopulmonary disease. 2. Atherosclerosis.   Electronically Signed   By: Vinnie Langton M.D.   On: 01/16/2014 11:40     EKG Interpretation None      MDM   Final diagnoses:  Altered mental status  Encephalopathy acute    10:20 AM Pt is a 63 y.o. female with pertinent PMHX of DM, HTN, CHF, ESRD Tue/thurs/Sat, dementia , rpevious  who presents to the ED with altered mental status. Hypoglycemia this morning. Blood sugar of 50, family gave insulin this morning and gave a coke. Hypoglycemia to 30s. Patient without complaints: chest pain, shortness of breath. No nausa, vomiting or  diarrhea. On plavix. No  evidene of warfarin on meds list. Fever yesterday as high as 101. 1 episode of diarrhea today non bloody  On exam: alert and oriented x2, confused. Concern for possible infection, AMS from hypoglycemia.  Per nursing patient noted to have dark colored stool, stool hemo-occult positive  CXR AP portable per my read for AMS: no focal consolidation  EKG personally reviewed by myself showed NSR, PVC Rate of 89, PR 158m, QRS 958mQT/QTC 362/44045mnormal axis, without evidence of new ischemia. Comparison showed NSR, previous EKG with new ischemia, indication: AMS   Ct head wo contrast negative for acute intracranial abnormalities  Review of labs:  CBC: leukocytosis, H&H 10.7/32.7 CMP: no hyperkalemia, elevated alk phos TSH: 1.190  Given elevated white count in the setting of AMS concern for sepsis without source. Will obtain blood cultures. Send for ammonia given polypharmacy and start Vanc/Cefepime empirically for coverage for hospital associated sepsis  Discussed with hospitalist: plan to also obtain cath UA/Culture. Plan for admission to [facility] for AMS, hypoglycemia, suspected sepsis no source currently and possible GI bleed. Guaiac positive stool. No drop in hemoglobin with stable vitals  UA: positive nitrites and moderate leuks with many bacteria. Patient already started on vanc and cefepime broadly  Patient given 0.4 of narcan with significant improvement in mental status per family. Discussed with hospitalist: patient on plavix: will hold off on LP given patient is on plavix and antibiotics started  Labs, EKG and imaging reviewed by myself and considered in medical decision making if ordered.  Imaging interpreted by radiology. Pt was discussed with my attending, Dr. AllDawna PartD 12/26/2013 [phone]

## 2014-01-18 NOTE — Progress Notes (Signed)
Pt received ampule dextrose at 1720 d/t CBG of 62. 02 sat read 88 so [place] 3L applied.  UF off d/t low sbp. Pt alert, oriented to self. Responds to voice. Cannot understand what she is trying to say. Will answer questions. MD present.

## 2014-01-18 NOTE — ED Notes (Signed)
Pt a little more responsive after Narcan-- Dr. At bedside

## 2014-01-18 NOTE — ED Notes (Signed)
[facility] at the bedside. Patient's bed placement being changed to Step-Down.

## 2014-01-18 NOTE — ED Notes (Signed)
Pt more alert-- will respond to family, incontinent of stool, yelling, family at bedside talking with pt, calming pt down.

## 2014-01-18 NOTE — Consult Note (Signed)
[facility] for Infectious Disease  Date of Admission:  01/21/2014  Date of Consult:  01/06/2014  Reason for Consult:Sepsis, Mental status change Referring Physician: Ghimire  Impression/Recommendation Sepsis Mental Status change ESRD PVD- dry gangrene R great toe  Would  Await BCx Consider repeat CXR when hydrated No change in anbx for now Amputation of R great toe?  Consider stopping ACV Check HIV Agree with palliative care involvement  Comment- Not clear she is meningitic. She is on plavix so cannot have LP.   Thank you so much for this interesting consult,   Bobby Rumpf (pager) [phone] [email or web address]  [name] is an 63 y.o. female.  HPI: 63 yo F with hx of ESRD, recent non-STEMI (9-18) with PCI/stent, PVD, dementia (? Mild), who was brought to ED today for fever and confusion. She was noted to have temp to 101 10-23. Due to confusion, this Am the pt's FSG was checked and it was noted to be 50. In ED she was given narcan, D50 with incomplete improvement.  She was found to have WBC 31.6, clear CXR. CT head (-).   Past Medical History  Diagnosis Date  . Diabetes mellitus   . Hypertension   . CHF (congestive heart failure)   . Hyperlipidemia   . End-stage renal disease on hemodialysis     Started dialysis in late 2014. Gets HD in [place] on TTS schedule.     . Thyroid disease   . Depression   . Shortness of breath     at night  . Arthritis   . Dementia   . Stroke     2013, weakness- all over  . Constipation   . Vision disturbance   . End stage renal disease 03/30/2011  . Coronary artery disease     Past Surgical History  Procedure Laterality Date  . Arteriovenous graft placement      left upper extremity  . Tubal ligation      x2  . Av fistula placement Left 07/02/2012    Procedure: ARTERIOVENOUS (AV) FISTULA CREATION;  Surgeon: Conrad [place], MD;  Location: [facility];  Service: Vascular;  Laterality: Left;  . Av fistula  placement Left 10/01/2012    Procedure: INSERTION OF ARTERIOVENOUS (AV) GORE-TEX GRAFT LEFT UPPER ARM;  Surgeon: Conrad [place], MD;  Location: [facility];  Service: Vascular;  Laterality: Left;  Ultrasound guided     Allergies  Allergen Reactions  . Phenergan [Promethazine Hcl]     unknown    Medications:  Scheduled: . [START ON 01/21/2014] darbepoetin (ARANESP) injection - DIALYSIS  200 mcg Intravenous Q Tue-HD  . [START ON 01/21/2014] ferric gluconate (FERRLECIT/NULECIT) IV  62.5 mg Intravenous Q Tue-HD  . multivitamin  1 tablet Oral QHS    Abtx:  Anti-infectives   Start     Dose/Rate Route Frequency Ordered Stop   01/19/14 1800  acyclovir (ZOVIRAX) 240 mg in dextrose 5 % 100 mL IVPB     5 mg/kg  47.8 kg (Ideal) 104.8 mL/hr over 60 Minutes Intravenous Every 24 hours 01/15/2014 1401     12/26/2013 1430  acyclovir (ZOVIRAX) 240 mg in dextrose 5 % 100 mL IVPB     240 mg 104.8 mL/hr over 60 Minutes Intravenous  Once 01/09/2014 1401     01/20/2014 1245  vancomycin (VANCOCIN) IVPB 1000 mg/200 mL premix     1,000 mg 200 mL/hr over 60 Minutes Intravenous  Once 12/30/2013 1234 01/09/2014 1525   01/14/2014 1245  ceFEPIme (  MAXIPIME) 2 g in dextrose 5 % 50 mL IVPB     2 g 100 mL/hr over 30 Minutes Intravenous  Once 12/28/2013 1234 01/15/2014 1422   01/10/2014 1215  vancomycin (VANCOCIN) IVPB 750 mg/150 ml premix  Status:  Discontinued     750 mg 150 mL/hr over 60 Minutes Intravenous  Once 01/06/2014 1202 01/15/2014 1234      Total days of antibiotics 0 Vanco/cefepime/acyclovir          Social History:  reports that she has never smoked. She has never used smokeless tobacco. She reports that she does not drink alcohol or use illicit drugs.  Family History  Problem Relation Age of Onset  . Diabetes Father   . Stroke Father   . Heart disease Mother     General ROS: she knows her name, responds to pain, does not make urine, denies diarrhea, denies headache, see HPI.   Blood pressure 92/56, pulse 107,  temperature 99.3 F (37.4 C), temperature source Oral, resp. rate 15, height _0  (1.549 m), weight 54 kg (119 lb 0.8 oz), SpO2 100.00%. General appearance: alert and no distress Eyes: negative findings: pupils equal, round, reactive to light and accomodation and no photophobia Throat: abnormal findings: dentition: multiple carries and enlarged papillae on tongue Neck: no adenopathy, supple, symmetrical, trachea midline and i am able to passively move her neck without resistance Lungs: clear to auscultation bilaterally Heart: regular rate and rhythm Abdomen: normal findings: bowel sounds normal and soft, non-tender Extremities: edema none and R foot is cooler than L. her great toe appears "mummified". her AVF is clean.    Results for orders placed during the hospital encounter of 01/22/2014 (from the past 48 hour(s))  CBG MONITORING, ED     Status: Abnormal   Collection Time    12/28/2013 10:15 AM      Result Value Ref Range   Glucose-Capillary 36 (*) 70 - 99 mg/dL  COMPREHENSIVE METABOLIC PANEL     Status: Abnormal   Collection Time    01/05/2014 10:35 AM      Result Value Ref Range   Sodium 137  137 - 147 mEq/L   Potassium 3.7  3.7 - 5.3 mEq/L   Chloride 89 (*) 96 - 112 mEq/L   CO2 20  19 - 32 mEq/L   Glucose, Bld 171 (*) 70 - 99 mg/dL   BUN 70 (*) 6 - 23 mg/dL   Creatinine, Ser 9.24 (*) 0.50 - 1.10 mg/dL   Calcium 9.1  8.4 - 10.5 mg/dL   Total Protein 7.8  6.0 - 8.3 g/dL   Albumin 2.1 (*) 3.5 - 5.2 g/dL   AST 35  0 - 37 U/L   ALT 14  0 - 35 U/L   Alkaline Phosphatase 214 (*) 39 - 117 U/L   Total Bilirubin 0.2 (*) 0.3 - 1.2 mg/dL   GFR calc non Af Amer 4 (*) >90 mL/min   GFR calc Af Amer 5 (*) >90 mL/min   Comment: (NOTE)     The eGFR has been calculated using the CKD EPI equation.     This calculation has not been validated in all clinical situations.     eGFR's persistently <90 mL/min signify possible Chronic Kidney     Disease.   Anion gap 28 (*) 5 - 15  TSH     Status:  None   Collection Time    01/10/2014 10:35 AM      Result Value Ref Range  TSH 1.190  0.350 - 4.500 uIU/mL  CBC WITH DIFFERENTIAL     Status: Abnormal   Collection Time    12/31/2013 10:35 AM      Result Value Ref Range   WBC 31.6 (*) 4.0 - 10.5 K/uL   RBC 3.96  3.87 - 5.11 MIL/uL   Hemoglobin 10.7 (*) 12.0 - 15.0 g/dL   HCT 32.7 (*) 36.0 - 46.0 %   MCV 82.6  78.0 - 100.0 fL   MCH 27.0  26.0 - 34.0 pg   MCHC 32.7  30.0 - 36.0 g/dL   RDW 16.3 (*) 11.5 - 15.5 %   Platelets 447 (*) 150 - 400 K/uL   Neutrophils Relative % 90 (*) 43 - 77 %   Lymphocytes Relative 5 (*) 12 - 46 %   Monocytes Relative 5  3 - 12 %   Eosinophils Relative 0  0 - 5 %   Basophils Relative 0  0 - 1 %   Neutro Abs 28.4 (*) 1.7 - 7.7 K/uL   Lymphs Abs 1.6  0.7 - 4.0 K/uL   Monocytes Absolute 1.6 (*) 0.1 - 1.0 K/uL   Eosinophils Absolute 0.0  0.0 - 0.7 K/uL   Basophils Absolute 0.0  0.0 - 0.1 K/uL   WBC Morphology TOXIC GRANULATION    I-STAT TROPOININ, ED     Status: None   Collection Time    01/21/2014 10:44 AM      Result Value Ref Range   Troponin i, poc 0.00  0.00 - 0.08 ng/mL   Comment 3            Comment: Due to the release kinetics of cTnI,     a negative result within the first hours     of the onset of symptoms does not rule out     myocardial infarction with certainty.     If myocardial infarction is still suspected,     repeat the test at appropriate intervals.  CBG MONITORING, ED     Status: Abnormal   Collection Time    12/27/2013 11:06 AM      Result Value Ref Range   Glucose-Capillary 138 (*) 70 - 99 mg/dL  POC OCCULT BLOOD, ED     Status: Abnormal   Collection Time    12/30/2013 11:58 AM      Result Value Ref Range   Fecal Occult Bld POSITIVE (*) NEGATIVE  CBG MONITORING, ED     Status: None   Collection Time    01/07/2014 12:06 PM      Result Value Ref Range   Glucose-Capillary 94  70 - 99 mg/dL  AMMONIA     Status: None   Collection Time    01/05/2014 12:45 PM      Result Value Ref  Range   Ammonia 35  11 - 60 umol/L  URINALYSIS, ROUTINE W REFLEX MICROSCOPIC     Status: Abnormal   Collection Time    01/11/2014  1:33 PM      Result Value Ref Range   Color, Urine YELLOW  YELLOW   APPearance CLOUDY (*) CLEAR   Specific Gravity, Urine 1.022  1.005 - 1.030   pH 7.5  5.0 - 8.0   Glucose, UA NEGATIVE  NEGATIVE mg/dL   Hgb urine dipstick LARGE (*) NEGATIVE   Bilirubin Urine SMALL (*) NEGATIVE   Ketones, ur NEGATIVE  NEGATIVE mg/dL   Protein, ur 100 (*) NEGATIVE mg/dL   Urobilinogen, UA 0.2  0.0 - 1.0 mg/dL   Nitrite POSITIVE (*) NEGATIVE   Leukocytes, UA MODERATE (*) NEGATIVE  URINE MICROSCOPIC-ADD ON     Status: Abnormal   Collection Time    01/02/2014  1:33 PM      Result Value Ref Range   Squamous Epithelial / LPF FEW (*) RARE   WBC, UA 7-10  <3 WBC/hpf   RBC / HPF 3-6  <3 RBC/hpf   Bacteria, UA MANY (*) RARE   Urine-Other MANY YEAST     Comment: AMORPHOUS URATES/PHOSPHATES      Component Value Date/Time   SDES BLOOD RIGHT HAND 12/10/2013 1030   SPECREQUEST BOTTLES DRAWN AEROBIC ONLY 3CC 12/10/2013 1030   CULT  Value: NO GROWTH 5 DAYS Performed at [facility] 12/10/2013 1030   REPTSTATUS 12/16/2013 FINAL 12/10/2013 1030   Ct Head Wo Contrast  01/03/2014   CLINICAL DATA:  Altered mental status. Unresponsive patient. History of hypertension, diabetes and congestive heart failure. History of kidney disease.  EXAM: CT HEAD WITHOUT CONTRAST  TECHNIQUE: Contiguous axial images were obtained from the base of the skull through the vertex without intravenous contrast.  COMPARISON:  06/18/2013.  FINDINGS: Ventricles are normal configuration. There is ventricular and sulcal enlargement reflecting moderate atrophy, advanced for this patient's age.  No parenchymal masses or mass effect. No evidence of a recent cortical infarct.  There multiple small foci of hypoattenuation in the basal ganglia and white matter consistent with old lacune infarcts. Patchy white matter  hypoattenuation seen more diffusely is consistent with moderate chronic microvascular ischemic change.  No extra-axial masses or abnormal fluid collections.  There is no intracranial hemorrhage.  Moderate left sphenoid sinus mucosal thickening. Remaining visualized sinuses and mastoid air cells are clear. No skull lesion.  IMPRESSION: 1. No acute intracranial abnormalities. 2. Moderate atrophy, advanced for age. Moderate chronic microvascular ischemic change. Small old lacunar infarcts.   Electronically Signed   By: Lajean Manes M.D.   On: 01/14/2014 11:32   Dg Chest Portable 1 View  01/09/2014   CLINICAL DATA:  63 year old female with altered mental status. Shortness of breath. End-stage renal disease. History of congestive heart failure.  EXAM: PORTABLE CHEST - 1 VIEW  COMPARISON:  Chest x-ray 12/10/2013.  FINDINGS: Lung volumes are low. No consolidative airspace disease. No pleural effusions. Mild crowding of the pulmonary vasculature, accentuated by low lung volumes, without frank pulmonary edema. Atherosclerosis in the thoracic aorta. Heart size is normal. The patient is rotated to the right on today's exam, resulting in distortion of the mediastinal contours and reduced diagnostic sensitivity and specificity for mediastinal pathology.  IMPRESSION: 1. Low lung volumes without radiographic evidence of acute cardiopulmonary disease. 2. Atherosclerosis.   Electronically Signed   By: Vinnie Langton M.D.   On: 12/31/2013 11:40   No results found for this or any previous visit (from the past 240 hour(s)).    01/12/2014, 5:28 PM     LOS: 0 days

## 2014-01-18 NOTE — H&P (Addendum)
PATIENT DETAILS Name: [name] Age: 63 y.o. Sex: female Date of Birth: 1950-12-21 Admit Date: 12/27/2013 EQA:[id number],QIWL, MD   CHIEF COMPLAINT:  Fever, altered mental status  HPI: [name] is a 63 y.o. female with a Past Medical History of ESRD on dialysis (TTS), recent non-STEMI with PCI, hypertension, mild dementia who presents today with the above noted complaint. Please note, patient not able to participate in history taking process because of altered mental status, most of this history is obtained from the patient's daughter. Patient was in her usual state of health till last Tuesday, following which she slowly started becoming lethargic and becoming more confused. Daughter claims she had a fever of 101 at home yesterday. Patient is very frail, lives with her daughter and is very minimally ambulatory. 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 Daughter, claims that she gives the patient narcotics, and she does not think that the patient can access the narcotics as it is kept in a different room. Because of worsening confusion this morning, the daughter checked her sugar and found it to be in the 7s. EMS was called, and patient was brought to the emergency room.In the ED, patient's CBG was 36. She was given 50% dextrose, with only some improvement in her mental status. She was also given Narcan with some improvement in her mental status, however still not back to her usual baseline. Per daughter, she did have one episode of vomiting early this morning. Per daughter, patient did not complain of headache, cough, chest pain. There is no history of diarrhea. Further evaluation in the emergency room, revealed a leukocytosis of 30,000, chest x-ray was without any pneumonia. Urinalysis was pending, urine was just obtained via a straight  cath.   ALLERGIES:   Allergies  Allergen Reactions  . Phenergan [Promethazine Hcl]     unknown    PAST MEDICAL HISTORY: Past Medical History  Diagnosis Date  . Diabetes mellitus   . Hypertension   . CHF (congestive heart failure)   . Hyperlipidemia   . End-stage renal disease on hemodialysis     Started dialysis in late 2014. Gets HD in [place] on TTS schedule.     . Thyroid disease   . Depression   . Shortness of breath     at night  . Arthritis   . Dementia   . Stroke     2013, weakness- all over  . Constipation   . Vision disturbance   . End stage renal disease 03/30/2011  . Coronary artery disease     PAST SURGICAL HISTORY: Past Surgical History  Procedure Laterality Date  . Arteriovenous graft placement      left upper extremity  . Tubal ligation      x2  . Av fistula placement Left 07/02/2012    Procedure: ARTERIOVENOUS (AV) FISTULA CREATION;  Surgeon: Conrad St. Joe, MD;  Location: [place];  Service: Vascular;  Laterality: Left;  . Av fistula placement Left 10/01/2012    Procedure: INSERTION OF ARTERIOVENOUS (AV) GORE-TEX GRAFT LEFT UPPER ARM;  Surgeon: Conrad Union, MD;  Location: [place];  Service: Vascular;  Laterality: Left;  Ultrasound guided    MEDICATIONS AT HOME: Prior to Admission medications   Medication Sig Start Date End Date Taking? Authorizing Provider  amLODipine (NORVASC) 10 MG tablet Take 1  tablet (10 mg total) by mouth daily. 06/07/13   Kelvin Cellar, MD  aspirin EC 81 MG EC tablet Take 1 tablet (81 mg total) by mouth daily. 12/17/13   Geradine Girt, DO  calcitRIOL (ROCALTROL) 0.25 MCG capsule Take 1 capsule (0.25 mcg total) by mouth Every Tuesday,Thursday,and Saturday with dialysis. 12/17/13   Geradine Girt, DO  clopidogrel (PLAVIX) 75 MG tablet Take 1 tablet (75 mg total) by mouth daily with breakfast. 09/19/13   Elam Dutch, MD  darbepoetin [facility]) 100 MCG/0.5ML SOLN injection Inject 0.5 mLs (100 mcg total) into the vein every Monday with  hemodialysis. 12/17/13   Geradine Girt, DO  gabapentin (NEURONTIN) 300 MG capsule Take 1 capsule (300 mg total) by mouth 2 (two) times daily. 12/17/13   Geradine Girt, DO  isosorbide mononitrate (IMDUR) 60 MG 24 hr tablet Take 1 tablet (60 mg total) by mouth daily. 12/17/13   Geradine Girt, DO  metoprolol (LOPRESSOR) 100 MG tablet Take 1 tablet (100 mg total) by mouth 2 (two) times daily. 12/17/13   Geradine Girt, DO  multivitamin (RENA-VIT) TABS tablet Take 1 tablet by mouth at bedtime. 05/24/13   Maryann Mikhail, DO  Nutritional Supplements (FEEDING SUPPLEMENT, NEPRO CARB STEADY,) LIQD Take 237 mLs by mouth 2 (two) times daily between meals. 12/17/13   Geradine Girt, DO  oxycodone (OXY-IR) 5 MG capsule Take 1 capsule (5 mg total) by mouth 3 (three) times daily. 11/04/13   Serafina Mitchell, MD  simvastatin (ZOCOR) 20 MG tablet Take 1 tablet (20 mg total) by mouth daily at 6 PM. 06/05/13   Debbe Odea, MD  ZOFRAN 4 MG tablet Take 4 mg by mouth every 8 (eight) hours as needed for nausea or vomiting.  06/25/13   Historical Provider, MD    FAMILY HISTORY: Family History  Problem Relation Age of Onset  . Diabetes Father   . Stroke Father   . Heart disease Mother     SOCIAL HISTORY:  reports that she has never smoked. She has never used smokeless tobacco. She reports that she does not drink alcohol or use illicit drugs.  REVIEW OF SYSTEMS: obtained from daughter Constitutional:   No , night sweats  HEENT:    No headaches, Difficulty swallowing,Tooth/dental problems,Sore throat,   Cardio-vascular: No chest pain,  Orthopnea, PND, swelling in lower extremities, anasarca  GI:  No heartburn, indigestion, abdominal pain, nausea,  diarrhea  Resp: No shortness of breath with exertion or at rest.  No excess mucus, no productive cough, No non-productive cough,  No coughing up of blood.No change in color of mucus.No wheezing.No chest wall deformity  Skin:  no rash or lesions.  GU:  no dysuria,  change in color of urine, no urgency or frequency.  No flank pain.  Musculoskeletal: No joint pain or swelling.  No decreased range of motion.   PHYSICAL EXAM: Blood pressure 151/84, pulse 87, temperature 98.1 F (36.7 C), temperature source Rectal, resp. rate 21, SpO2 100.00%.  General appearance :Awake, confused, follows some commands. Chronically sick looking but, not in any distress. HEENT: Atraumatic and Normocephalic, pupils around 2 mm equally reactive to light and accomodation Neck: no JVD.Poor exam-given mental status-but neck seems to be rigid on multiple attempts.On [place]-complains of pain in knee and back on multiple attempts. Chest:Good air entry bilaterally, no added sounds  CVS: S1 S2 regular  Abdomen: Bowel sounds present, Non tender and not distended with no gaurding, rigidity or rebound. Extremities: B/L  Lower Ext shows no edema Neurology:Moves all 4 ext Skin:No Rash Wounds:N/A  LABS ON ADMISSION:   Recent Labs  01/15/2014 1035  NA 137  K 3.7  CL 89*  CO2 20  GLUCOSE 171*  BUN 70*  CREATININE 9.24*  CALCIUM 9.1    Recent Labs  01/02/2014 1035  AST 35  ALT 14  ALKPHOS 214*  BILITOT 0.2*  PROT 7.8  ALBUMIN 2.1*   No results found for this basename: LIPASE, AMYLASE,  in the last 72 hours  Recent Labs  01/12/2014 1035  WBC 31.6*  NEUTROABS 28.4*  HGB 10.7*  HCT 32.7*  MCV 82.6  PLT 447*   No results found for this basename: CKTOTAL, CKMB, CKMBINDEX, TROPONINI,  in the last 72 hours No results found for this basename: DDIMER,  in the last 72 hours No components found with this basename: POCBNP,    RADIOLOGIC STUDIES ON ADMISSION: Ct Head Wo Contrast  12/28/2013   CLINICAL DATA:  Altered mental status. Unresponsive patient. History of hypertension, diabetes and congestive heart failure. History of kidney disease.  EXAM: CT HEAD WITHOUT CONTRAST  TECHNIQUE: Contiguous axial images were obtained from the base of the skull through the vertex  without intravenous contrast.  COMPARISON:  06/18/2013.  FINDINGS: Ventricles are normal configuration. There is ventricular and sulcal enlargement reflecting moderate atrophy, advanced for this patient's age.  No parenchymal masses or mass effect. No evidence of a recent cortical infarct.  There multiple small foci of hypoattenuation in the basal ganglia and white matter consistent with old lacune infarcts. Patchy white matter hypoattenuation seen more diffusely is consistent with moderate chronic microvascular ischemic change.  No extra-axial masses or abnormal fluid collections.  There is no intracranial hemorrhage.  Moderate left sphenoid sinus mucosal thickening. Remaining visualized sinuses and mastoid air cells are clear. No skull lesion.  IMPRESSION: 1. No acute intracranial abnormalities. 2. Moderate atrophy, advanced for age. Moderate chronic microvascular ischemic change. Small old lacunar infarcts.   Electronically Signed   By: Lajean Manes M.D.   On: 01/17/2014 11:32   Dg Chest Portable 1 View  01/14/2014   CLINICAL DATA:  63 year old female with altered mental status. Shortness of breath. End-stage renal disease. History of congestive heart failure.  EXAM: PORTABLE CHEST - 1 VIEW  COMPARISON:  Chest x-ray 12/10/2013.  FINDINGS: Lung volumes are low. No consolidative airspace disease. No pleural effusions. Mild crowding of the pulmonary vasculature, accentuated by low lung volumes, without frank pulmonary edema. Atherosclerosis in the thoracic aorta. Heart size is normal. The patient is rotated to the right on today's exam, resulting in distortion of the mediastinal contours and reduced diagnostic sensitivity and specificity for mediastinal pathology.  IMPRESSION: 1. Low lung volumes without radiographic evidence of acute cardiopulmonary disease. 2. Atherosclerosis.   Electronically Signed   By: Vinnie Langton M.D.   On: 12/27/2013 11:40     EKG: Independently reviewed. NSR, Twave inversion  in inf lat leads (seem to be old)  ASSESSMENT AND PLAN: Present on Admission:  . Encephalopathy acute: Given significant leukocytosis, likely infectious etiology. Unfortunately no foci of evident at present. Await UA, will culture blood. Although exam inconsistent, does appear to have some nuchal rigidity, hence will go ahead and empirically start vancomycin, cefepime and acyclovir. Unable to perform lumbar puncture as on Plavix for a recent non-STEMI. Hopefully, as her clinical course progresses other foci of infection becomes apparent or culture data comes back positive. Suspect some contribution to encephalopathy from hypoglycemia in  the context. Place on droplet precautions for now, check MRI brain, have consulted infectious disease as well. Follow clinical course . Sepsis: Foci of infection not evident, treat with IV antibiotics. Monitor in step down unit  . ESRD: Renal consulted for dialysis. Dialysis days are Tuesdays, Thursdays and Saturdays. Missed dialysis on Thursday  . Hypoglycemia:does not appear to be on oral meds/Insulin, likey secondary to poor oral intake/sepsis.Monitor CBG's closely, and given prn Dextrose. Encourage oral intake when mental status improves . Recent NSTEMI (non-ST elevated myocardial infarction): Continue with antiplatelet agents.  . Chronic Diastolic KHT:[id number], vol management with HD . Malnutrition of moderate degree: Nutrition evaluation  . Hypertension: Continue with usual antihypertensive medications, follow BP and adjust/titrate accordingly . Peripheral vascular disease with dry gangrene of right big toe: Seen by vascular surgery last admission, not a candidate for bypass and revascularization. .Failure to thrive: Dementia, end-stage renal disease, with significant peripheral vascular disease. She appears very frail, and now acutely ill. Very poor overall prognosis, Daughter desires she be a full code. If not significantly clinically improved during hospital  course, may need palliative consultation  Further plan will depend as patient's clinical course evolves and further radiologic and laboratory data become available. Patient will be monitored closely.   Above noted plan was discussed with daughter, she was in agreement.   DVT Prophylaxis: Prophylactic Heparin  Code Status: Full Code  Disposition Plan: Home with home health vs SNF  Total time spent for admission equals 45 minutes.  [facility] Pager [phone]  If 7PM-7AM, please contact night-coverage [email or web address] Password [email or web address] , 1:59 PM

## 2014-01-19 ENCOUNTER — Inpatient Hospital Stay (HOSPITAL_COMMUNITY): Payer: Medicare Other

## 2014-01-19 DIAGNOSIS — I70219 Atherosclerosis of native arteries of extremities with intermittent claudication, unspecified extremity: Secondary | ICD-10-CM

## 2014-01-19 DIAGNOSIS — R404 Transient alteration of awareness: Secondary | ICD-10-CM

## 2014-01-19 LAB — URINE CULTURE
Colony Count: NO GROWTH
Culture: NO GROWTH

## 2014-01-19 LAB — TROPONIN I: Troponin I: <0.3 ng/mL (ref <0.30)

## 2014-01-19 LAB — GLUCOSE, CAPILLARY
GLUCOSE-CAPILLARY: 196 mg/dL — AB (ref 70–99)
GLUCOSE-CAPILLARY: 43 mg/dL — AB (ref 70–99)
GLUCOSE-CAPILLARY: 73 mg/dL (ref 70–99)
GLUCOSE-CAPILLARY: 126 mg/dL — AB (ref 70–99)
GLUCOSE-CAPILLARY: 96 mg/dL (ref 70–99)
GLUCOSE-CAPILLARY: 76 mg/dL (ref 70–99)
Glucose-Capillary: 73 mg/dL (ref 70–99)
Glucose-Capillary: 94 mg/dL (ref 70–99)
Glucose-Capillary: 71 mg/dL (ref 70–99)
Glucose-Capillary: 71 mg/dL (ref 70–99)

## 2014-01-19 LAB — HIV ANTIBODY (ROUTINE TESTING W REFLEX): HIV: NONREACTIVE

## 2014-01-19 LAB — BASIC METABOLIC PANEL
Anion gap: 17 — ABNORMAL HIGH (ref 5–15)
BUN: 23 mg/dL (ref 6–23)
CO2: 28 mEq/L (ref 19–32)
CREATININE: 4.39 mg/dL — AB (ref 0.50–1.10)
Calcium: 9.4 mg/dL (ref 8.4–10.5)
Chloride: 98 mEq/L (ref 96–112)
GFR calc Af Amer: 11 mL/min — ABNORMAL LOW (ref >90)
GFR, EST NON AFRICAN AMERICAN: 10 mL/min — AB (ref >90)
Glucose, Bld: 44 mg/dL — CL (ref 70–99)
Potassium: 4 mEq/L (ref 3.7–5.3)
Sodium: 143 mEq/L (ref 137–147)

## 2014-01-19 LAB — MRSA PCR SCREENING: MRSA by PCR: NEGATIVE

## 2014-01-19 LAB — CBC
HCT: 31.8 % — ABNORMAL LOW (ref 36.0–46.0)
Hemoglobin: 10.3 g/dL — ABNORMAL LOW (ref 12.0–15.0)
MCH: 27 pg (ref 26.0–34.0)
MCHC: 32.4 g/dL (ref 30.0–36.0)
MCV: 83.2 fL (ref 78.0–100.0)
PLATELETS: 465 10*3/uL — AB (ref 150–400)
RBC: 3.82 MIL/uL — ABNORMAL LOW (ref 3.87–5.11)
RDW: 16.5 % — ABNORMAL HIGH (ref 11.5–15.5)
WBC: 25 10*3/uL — ABNORMAL HIGH (ref 4.0–10.5)

## 2014-01-19 LAB — INFLUENZA PANEL BY PCR (TYPE A & B)
H1N1FLUPCR: NOT DETECTED
INFLBPCR: NEGATIVE
Influenza A By PCR: NEGATIVE

## 2014-01-19 MED ORDER — CEFEPIME HCL 2 G IJ SOLR
2.0000 g | INTRAMUSCULAR | Status: DC
  Filled 2014-01-19: qty 2

## 2014-01-19 MED ORDER — VANCOMYCIN HCL 500 MG IV SOLR
500.0000 mg | INTRAVENOUS | Status: DC
  Filled 2014-01-19 (×2): qty 500

## 2014-01-19 MED ORDER — CHLORHEXIDINE GLUCONATE 0.12 % MT SOLN
15.0000 mL | Freq: Two times a day (BID) | OROMUCOSAL | Status: DC
  Administered 2014-01-19 – 2014-01-23 (×8): 15 mL via OROMUCOSAL
  Filled 2014-01-19 (×11): qty 15

## 2014-01-19 MED ORDER — SODIUM CHLORIDE 0.9 % IV BOLUS (SEPSIS)
500.0000 mL | Freq: Once | INTRAVENOUS | Status: AC
  Administered 2014-01-19: 500 mL via INTRAVENOUS

## 2014-01-19 MED ORDER — DEXTROSE 5 % IV SOLN
2.0000 g | Freq: Once | INTRAVENOUS | Status: AC
  Administered 2014-01-19: 2 g via INTRAVENOUS
  Filled 2014-01-19: qty 2

## 2014-01-19 MED ORDER — STROKE: EARLY STAGES OF RECOVERY BOOK
Freq: Once | Status: DC
  Filled 2014-01-19: qty 1

## 2014-01-19 MED ORDER — DEXTROSE 50 % IV SOLN
50.0000 mL | Freq: Once | INTRAVENOUS | Status: AC | PRN
  Administered 2014-01-19: 50 mL via INTRAVENOUS

## 2014-01-19 MED ORDER — VANCOMYCIN HCL IN DEXTROSE 1-5 GM/200ML-% IV SOLN
1000.0000 mg | Freq: Once | INTRAVENOUS | Status: AC
  Administered 2014-01-19: 1000 mg via INTRAVENOUS
  Filled 2014-01-19: qty 200

## 2014-01-19 MED ORDER — CETYLPYRIDINIUM CHLORIDE 0.05 % MT LIQD
7.0000 mL | Freq: Two times a day (BID) | OROMUCOSAL | Status: DC
  Administered 2014-01-19 – 2014-01-22 (×5): 7 mL via OROMUCOSAL

## 2014-01-19 NOTE — Progress Notes (Signed)
[facility] Progress Note   Subjective: Remains nonresponsive verbally  Filed Vitals:   01/19/14 0313 01/19/14 0314 01/19/14 0538 01/19/14 0700  BP: 106/51     Pulse: 91     Temp:  98.9 F (37.2 C)  98.1 F (36.7 C)  TempSrc:  Oral  Oral  Resp: 15     Height:      Weight:   43.5 kg (95 lb 14.4 oz)   SpO2: 100%      Exam: Frail older adult female, not in distress, withdrawn Will make eye contact briefly, followed simple commands to grip bilat No verbal response Chest clear bilat RRR no MRG Abd soft, NTND, no ascites No LE edema, R great toe dry gangrene Neuro is nf, gen weak, nonverbal today  HD: TTS [place]  3hr 38min   46.5kg   F160   350/1.5    2K/2Ca Bath    Profile 2   Heparin none  Aranesp 200 q week, Venofer 50 q week,  Calcitriol 0.25 TIW po  UA > 7-10 wbc, many yeast, many bact, 3-6 rbc       Assessment: 1 AMS - acute on chronic, prob due to infection. Hx of vascular dementia 2 Leukocytosis -  Poss UTI, enteritis, other, on empiric abx Vanc and Cefipime 3 ESRD on HD 4 HTN/volume - below dry wt, will hold norvasc/MTP for now, give NS bolus 5 Diarrhea - Cdif pend 6 DM w low BS 7 Anemia cont ESA , hold Fe for now 8 HPTH  - Ca high, holding vit D, phoslo, check phos 9 Gangrene R foot - not revasc candidate, amputate when needed 10 Debility - requires 24 hr care at home. Not good HD candidate. Will consult Pall care team to get involved.  Plan - stop BP meds, NS bolus 500 cc, next HD Tuesday. Pall care consult. Hold IV Fe until infection under control    Kelly Splinter MD  pager [phone]    cell [phone]  01/19/2014, 10:47 AM     Recent Labs Lab 12/27/2013 1035 01/19/14 0145  NA 137 143  K 3.7 4.0  CL 89* 98  CO2 20 28  GLUCOSE 171* 44*  BUN 70* 23  CREATININE 9.24* 4.39*  CALCIUM 9.1 9.4    Recent Labs Lab 12/30/2013 1035  AST 35  ALT 14  ALKPHOS 214*  BILITOT 0.2*  PROT 7.8  ALBUMIN 2.1*    Recent Labs Lab 01/23/2014 1035  01/19/14 0145  WBC 31.6* 25.0*  NEUTROABS 28.4*  --   HGB 10.7* 10.3*  HCT 32.7* 31.8*  MCV 82.6 83.2  PLT 447* 465*   . acyclovir  5 mg/kg (Ideal) Intravenous Q24H  . amLODipine  10 mg Oral Daily  . antiseptic oral rinse  7 mL Mouth Rinse q12n4p  . aspirin EC  81 mg Oral Daily  . [START ON 01/21/2014] calcitRIOL  0.25 mcg Oral Q T,Th,Sa-HD  . ceFEPime (MAXIPIME) IV  2 g Intravenous Once  . [START ON 01/21/2014] ceFEPime (MAXIPIME) IV  2 g Intravenous Q T,Th,Sat-1800  . chlorhexidine  15 mL Mouth Rinse BID  . clopidogrel  75 mg Oral Q breakfast  . [START ON 01/21/2014] darbepoetin (ARANESP) injection - DIALYSIS  200 mcg Intravenous Q Tue-HD  . feeding supplement (NEPRO CARB STEADY)  237 mL Oral BID BM  . [START ON 01/21/2014] ferric gluconate (FERRLECIT/NULECIT) IV  62.5 mg Intravenous Q Tue-HD  . heparin  5,000 Units Subcutaneous 3 times per day  .  isosorbide mononitrate  60 mg Oral Daily  . metoprolol  100 mg Oral BID  . multivitamin  1 tablet Oral QHS  . simvastatin  20 mg Oral q1800  . sodium chloride  3 mL Intravenous Q12H  . [START ON 01/21/2014] vancomycin  500 mg Intravenous Q T,Th,Sa-HD  . vancomycin  1,000 mg Intravenous Once     sodium chloride, sodium chloride, sodium chloride, acetaminophen, acetaminophen, albuterol, feeding supplement (NEPRO CARB STEADY), guaiFENesin-dextromethorphan, heparin, lidocaine (PF), lidocaine-prilocaine, ondansetron (ZOFRAN) IV, ondansetron, pentafluoroprop-tetrafluoroeth, sodium chloride

## 2014-01-19 NOTE — Progress Notes (Signed)
Hypoglycemic Event  CBG: 28  Treatment: D50 IV 50 mL  Symptoms: None  Follow-up CBG: Time:2126 CBG Result:110  Possible Reasons for Event: inadequate meal intake  Comments/MD notified:previously aware of low CBG, not new event    Alben Spittle, RN  Remember to initiate Hypoglycemia Order Set & complete

## 2014-01-19 NOTE — Progress Notes (Signed)
INFECTIOUS DISEASE PROGRESS NOTE  ID: [name] is a 63 y.o. female with  Active Problems:   CKD (chronic kidney disease) stage V requiring chronic dialysis   Vascular dementia   Hypertensive emergency   NSTEMI (non-ST elevated myocardial infarction)   Malnutrition of moderate degree   Encephalopathy acute  Subjective: Awake, doesn't respond.   Abtx:  Anti-infectives   Start     Dose/Rate Route Frequency Ordered Stop   01/21/14 1800  ceFEPIme (MAXIPIME) 2 g in dextrose 5 % 50 mL IVPB     2 g 100 mL/hr over 30 Minutes Intravenous Every T-Th-Sa (1800) 01/19/14 0826     01/21/14 1200  vancomycin (VANCOCIN) 500 mg in sodium chloride 0.9 % 100 mL IVPB     500 mg 100 mL/hr over 60 Minutes Intravenous Every T-Th-Sa (Hemodialysis) 01/19/14 0826     01/19/14 1800  acyclovir (ZOVIRAX) 240 mg in dextrose 5 % 100 mL IVPB     5 mg/kg  47.8 kg (Ideal) 104.8 mL/hr over 60 Minutes Intravenous Every 24 hours 01/11/2014 1401     01/19/14 1300  vancomycin (VANCOCIN) IVPB 1000 mg/200 mL premix     1,000 mg 200 mL/hr over 60 Minutes Intravenous  Once 01/19/14 0826     01/19/14 1200  ceFEPIme (MAXIPIME) 2 g in dextrose 5 % 50 mL IVPB     2 g 100 mL/hr over 30 Minutes Intravenous  Once 01/19/14 0826     12/28/2013 1430  acyclovir (ZOVIRAX) 240 mg in dextrose 5 % 100 mL IVPB     240 mg 104.8 mL/hr over 60 Minutes Intravenous  Once 01/15/2014 1401 01/13/2014 1842   12/30/2013 1245  vancomycin (VANCOCIN) IVPB 1000 mg/200 mL premix     1,000 mg 200 mL/hr over 60 Minutes Intravenous  Once 01/12/2014 1234 01/07/2014 1525   01/02/2014 1245  ceFEPIme (MAXIPIME) 2 g in dextrose 5 % 50 mL IVPB     2 g 100 mL/hr over 30 Minutes Intravenous  Once 01/23/2014 1234 01/23/2014 1422   01/17/2014 1215  vancomycin (VANCOCIN) IVPB 750 mg/150 ml premix  Status:  Discontinued     750 mg 150 mL/hr over 60 Minutes Intravenous  Once 01/07/2014 1202 01/16/2014 1234      Medications:  Scheduled: . acyclovir  5 mg/kg (Ideal)  Intravenous Q24H  . antiseptic oral rinse  7 mL Mouth Rinse q12n4p  . aspirin EC  81 mg Oral Daily  . [START ON 01/21/2014] calcitRIOL  0.25 mcg Oral Q T,Th,Sa-HD  . ceFEPime (MAXIPIME) IV  2 g Intravenous Once  . [START ON 01/21/2014] ceFEPime (MAXIPIME) IV  2 g Intravenous Q T,Th,Sat-1800  . chlorhexidine  15 mL Mouth Rinse BID  . clopidogrel  75 mg Oral Q breakfast  . [START ON 01/21/2014] darbepoetin (ARANESP) injection - DIALYSIS  200 mcg Intravenous Q Tue-HD  . heparin  5,000 Units Subcutaneous 3 times per day  . isosorbide mononitrate  60 mg Oral Daily  . multivitamin  1 tablet Oral QHS  . simvastatin  20 mg Oral q1800  . sodium chloride  500 mL Intravenous Once  . sodium chloride  3 mL Intravenous Q12H  . [START ON 01/21/2014] vancomycin  500 mg Intravenous Q T,Th,Sa-HD  . vancomycin  1,000 mg Intravenous Once    Objective: Vital signs in last 24 hours: Temp:  [97.6 F (36.4 C)-99.3 F (37.4 C)] 98.1 F (36.7 C) (10/25 0700) Pulse Rate:  [39-111] 91 (10/25 0313) Resp:  [8-30] 15 (  10/25 0313) BP: (85-152)/(47-96) 106/51 mmHg (10/25 0313) SpO2:  [62 %-100 %] 100 % (10/25 0313) FiO2 (%):  [100 %] 100 % (10/24 2045) Weight:  [43.5 kg (95 lb 14.4 oz)-54 kg (119 lb 0.8 oz)] 43.5 kg (95 lb 14.4 oz) (10/25 0538)   General appearance: no distress Neck: she does not flex her neck, she allows some passive lateral rotation of her neck.  Resp: clear to auscultation bilaterally Cardio: regular rate and rhythm GI: normal findings: bowel sounds normal and soft, non-tender  Lab Results  Recent Labs  01/07/2014 1035 01/19/14 0145  WBC 31.6* 25.0*  HGB 10.7* 10.3*  HCT 32.7* 31.8*  NA 137 143  K 3.7 4.0  CL 89* 98  CO2 20 28  BUN 70* 23  CREATININE 9.24* 4.39*   Liver Panel  Recent Labs  01/19/2014 1035  PROT 7.8  ALBUMIN 2.1*  AST 35  ALT 14  ALKPHOS 214*  BILITOT 0.2*   Sedimentation Rate No results found for this basename: ESRSEDRATE,  in the last 72  hours C-Reactive Protein No results found for this basename: CRP,  in the last 72 hours  Microbiology: Recent Results (from the past 240 hour(s))  URINE CULTURE     Status: None   Collection Time    01/05/2014  1:33 PM      Result Value Ref Range Status   Specimen Description URINE, CLEAN CATCH   Final   Special Requests NONE   Final   Culture  Setup Time     Final   Value: 12/27/2013 14:20     Performed at [facility]     Final   Value: NO GROWTH     Performed at [facility]   Culture     Final   Value: NO GROWTH     Performed at [facility]   Report Status 01/19/2014 FINAL   Final  MRSA PCR SCREENING     Status: None   Collection Time    12/27/2013 10:51 PM      Result Value Ref Range Status   MRSA by PCR NEGATIVE  NEGATIVE Final   Comment:            The GeneXpert MRSA Assay (FDA     approved for NASAL specimens     only), is one component of a     comprehensive MRSA colonization     surveillance program. It is not     intended to diagnose MRSA     infection nor to guide or     monitor treatment for     MRSA infections.    Studies/Results: Ct Head Wo Contrast  12/30/2013   CLINICAL DATA:  Altered mental status. Unresponsive patient. History of hypertension, diabetes and congestive heart failure. History of kidney disease.  EXAM: CT HEAD WITHOUT CONTRAST  TECHNIQUE: Contiguous axial images were obtained from the base of the skull through the vertex without intravenous contrast.  COMPARISON:  06/18/2013.  FINDINGS: Ventricles are normal configuration. There is ventricular and sulcal enlargement reflecting moderate atrophy, advanced for this patient's age.  No parenchymal masses or mass effect. No evidence of a recent cortical infarct.  There multiple small foci of hypoattenuation in the basal ganglia and white matter consistent with old lacune infarcts. Patchy white matter hypoattenuation seen more diffusely is consistent with moderate  chronic microvascular ischemic change.  No extra-axial masses or abnormal fluid collections.  There is no intracranial hemorrhage.  Moderate left sphenoid sinus mucosal thickening. Remaining visualized sinuses and mastoid air cells are clear. No skull lesion.  IMPRESSION: 1. No acute intracranial abnormalities. 2. Moderate atrophy, advanced for age. Moderate chronic microvascular ischemic change. Small old lacunar infarcts.   Electronically Signed   By: Lajean Manes M.D.   On: 01/20/2014 11:32   Dg Chest Portable 1 View  01/13/2014   CLINICAL DATA:  63 year old female with altered mental status. Shortness of breath. End-stage renal disease. History of congestive heart failure.  EXAM: PORTABLE CHEST - 1 VIEW  COMPARISON:  Chest x-ray 12/10/2013.  FINDINGS: Lung volumes are low. No consolidative airspace disease. No pleural effusions. Mild crowding of the pulmonary vasculature, accentuated by low lung volumes, without frank pulmonary edema. Atherosclerosis in the thoracic aorta. Heart size is normal. The patient is rotated to the right on today's exam, resulting in distortion of the mediastinal contours and reduced diagnostic sensitivity and specificity for mediastinal pathology.  IMPRESSION: 1. Low lung volumes without radiographic evidence of acute cardiopulmonary disease. 2. Atherosclerosis.   Electronically Signed   By: Vinnie Langton M.D.   On: 01/08/2014 11:40     Assessment/Plan: Sepsis  Mental Status change  ESRD  PVD- dry gangrene R great toe Diarrhea at home Hypoglycemia  Total days of antibiotics: 2 vanco/cefepime/acyclovir  WBC some better. Afebrile Her mental status is unchanged Await stool in hospital to check C diff Await her BCx and UCx          [facility]ump[facility] Infect[facility] (pager) [phone] [email or web address] 01/19/2014, 11:35 AM  LOS: 1 day

## 2014-01-19 NOTE — Progress Notes (Signed)
Hypoglycemic Event  CBG: 43  Treatment: D50 IV 50 mL  Symptoms: None  Follow-up CBG: Time:224 CBG Result:196  Possible Reasons for Event: Inadequate meal intake  Comments/MD notified:previous problem, MD aware    Alben Spittle, RN Remember to initiate Hypoglycemia Order Set & complete

## 2014-01-19 NOTE — Progress Notes (Signed)
Patient ID: [name], female   DOB: [birth year], 63 y.o.   MRN: [id number]  [facility] PROGRESS NOTE  [name] ZLD:[id number] DOB: Jul 31, 1950 DOA: 01/06/2014 PCP: Marco Collie, MD  Brief narrative: 63 y.o. female with ESRD on dialysis (TTS), recent non-STEMI with PCI, hypertension, dementia who presented with AMS. Pt was unable to provide any history on admission and daughter was available at bedside who said pt was doing well one week prior to this admission but has become more lethargic and was unable to get out of the bed, she also reported fevers of 101 F. Daughter did report that pt is minimally ambulatory at baseline.   Assessment and Plan:    Active Problems:   Acute encephalopathy - multifactorial, secondary to acute infection (? Source) imposed on vascular dementia, 3 new punctate infarctions noted on brain MRI with evidence of chronic multiple small lacunar strokes, resulting dehydration form poor oral intake  - treat with empiric ABX as recommended by ID  - stroke orders in place as noted below  - will need PT/OT/SLP   Acute nonhemorrhagic strokes  - noted on brain MRI - stroke orders place - will need SLP/PT/OT - hold off on 2 D ECHO and carotid dopplers as it was recently done 05/2013 and as our management would not change given pt's poor overall prognosis given multiple complex medical conditions outlined here  - d/w neurologist Dr. Aram Beecham and he agrees with holding off on 2 D ECHO and carotid dopplers for same reason outline above  - also consulted PCT by nephrology team given poor overall prognosis    CKD (chronic kidney disease) stage V requiring chronic dialysis - nephrology team following, appreciate assistance    Hypertensive emergency, apparently recorder in ED  - BP soft this AM with SBP in 90's  - hold Norvasc and give bolus NS   Sepsis  - criteria met given tachycardia, leukocytosis, RR > 22 bpm, hypotension  - ? secondary to UTI, enetritis -  on vancomycin, Cefepime day #2 - blood and urine culture pending, C. Diff pending as well  - appreciate ID input    Severe PCM - secondary to progressive FTT, dementia, acute illness, strokes acute and chronic - SLP requested    DM - place on SSI for now    Anemia of chronic disease, CKD  - Hg remains stable over the past 24 hours    Gangrene R foot  - not revasc candidate   Functional quadriplegia - requires 24 hr care at home. Not good HD candidate - PCT consulted   DVT prophylaxis  Heparin SQ while pt is in hospital  Code Status: Full Family Communication: No family at bedside  Disposition Plan: Remains in SDU   IV Access:   Peripheral IV Procedures and diagnostic studies:   Ct Head Wo/C 01/13/2014  No acute intracranial abnormalities. Moderate atrophy, advanced for age. Moderate chronic microvascular ischemic change. Small old lacunar infarcts.   Mr Brain Wo/C 01/19/2014   3 new punctate areas of acute nonhemorrhagic infarct. Advanced cerebral atrophy and white matter disease, likely resulting chronic microvascular ischemic changes and end-stage renal disease. Multiple remote lacunar infarcts of the corona radiata and brainstem.    CXR 01/12/2014  Low lung volumes without radiographic evidence of acute cardiopulmonary disease.  Medical Consultants:   Nephrology ID Consulted with neurologist on call Dr. Aram Beecham  Other Consultants:   SLP/PT/OT  Anti-Infectives:   Vancomycin 10/24 -->  Maxipime 10/24 --> Acyclovir 10/24 -->  Faye Ramsay, MD  [facility] Pager [phone]  If 7PM-7AM, please contact night-coverage [email or web address] Password [email or web address] 01/19/2014, 4:32 PM   LOS: 1 day   HPI/Subjective: No events overnight.   Objective: Filed Vitals:   01/19/14 1110 01/19/14 1140 01/19/14 1420 01/19/14 1448  BP: 91/53  119/49   Pulse: 87  92   Temp:  97.8 F (36.6 C)  97.8 F (36.6 C)  TempSrc:  Oral  Axillary  Resp: 11  19   Height:      Weight:      SpO2: 95%   98%     Intake/Output Summary (Last 24 hours) at 01/19/14 1632 Last data filed at 01/19/14 1500  Gross per 24 hour  Intake    700 ml  Output   -461 ml  Net   1161 ml    Exam:   General:  Pt is non verbal, does not follow any commands, NAD   Cardiovascular: Regular rate and rhythm,  no rubs, no gallops  Respiratory: Clear to auscultation bilaterally, no wheezing, diminished breath sounds at bases   Abdomen: Soft, non tender, non distended, bowel sounds present, no guarding  Data Reviewed: Basic Metabolic Panel:  Recent Labs Lab 01/13/2014 1035 01/19/14 0145  NA 137 143  K 3.7 4.0  CL 89* 98  CO2 20 28  GLUCOSE 171* 44*  BUN 70* 23  CREATININE 9.24* 4.39*  CALCIUM 9.1 9.4   Liver Function Tests:  Recent Labs Lab 12/28/2013 1035  AST 35  ALT 14  ALKPHOS 214*  BILITOT 0.2*  PROT 7.8  ALBUMIN 2.1*    Recent Labs Lab 01/13/2014 1245  AMMONIA 35   CBC:  Recent Labs Lab 01/03/2014 1035 01/19/14 0145  WBC 31.6* 25.0*  NEUTROABS 28.4*  --   HGB 10.7* 10.3*  HCT 32.7* 31.8*  MCV 82.6 83.2  PLT 447* 465*   Cardiac Enzymes:  Recent Labs Lab 01/10/2014 2055 01/19/14 0145 01/19/14 0753  TROPONINI <0.30 <0.30 <0.30   CBG:  Recent Labs Lab 01/19/14 0444 01/19/14 0544 01/19/14 0656 01/19/14 0857 01/19/14 1438  GLUCAP 94 71 71 73 126*    Recent Results (from the past 240 hour(s))  URINE CULTURE     Status: None   Collection Time    01/08/2014  1:33 PM      Result Value Ref Range Status   Specimen Description URINE, CLEAN CATCH   Final   Special Requests NONE   Final   Culture  Setup Time     Final   Value: 01/06/2014 14:20     Performed at [place]     Final   Value: NO GROWTH     Performed at [facility]   Culture     Final   Value: NO GROWTH     Performed at [facility]   Report Status 01/19/2014 FINAL   Final  MRSA PCR SCREENING     Status: None   Collection Time    01/03/2014 10:51 PM       Result Value Ref Range Status   MRSA by PCR NEGATIVE  NEGATIVE Final   Comment:            The GeneXpert MRSA Assay (FDA     approved for NASAL specimens     only), is one component of a     comprehensive MRSA colonization     surveillance program. It is not     intended to diagnose  MRSA     infection nor to guide or     monitor treatment for     MRSA infections.     Scheduled Meds: . acyclovir  5 mg/kg (Ideal) Intravenous Q24H  . aspirin EC  81 mg Oral Daily  . [START ON 01/21/2014] calcitRIOL  0.25 mcg Oral Q T,Th,Sa-HD  . [START ON 01/21/2014] ceFEPime (MAXIPIME) IV  2 g Intravenous Q T,Th,Sat-1800  . clopidogrel  75 mg Oral Q breakfast  . heparin  5,000 Units Subcutaneous 3 times per day  . isosorbide mononitrate  60 mg Oral Daily  . simvastatin  20 mg Oral q1800  . [START ON 01/21/2014] vancomycin  500 mg Intravenous Q T,Th,Sa-HD   Continuous Infusions:

## 2014-01-19 NOTE — Progress Notes (Signed)
ANTIBIOTIC CONSULT NOTE - FOLLOW UP  Pharmacy Consult for Vancomycin, Cefepime, Acyclovir Indication: r/o sepsis vs. meningitis  Allergies  Allergen Reactions  . Phenergan [Promethazine Hcl]     unknown    Patient Measurements: Height: 5\' 1"  (154.9 cm) Weight: 95 lb 14.4 oz (43.5 kg) IBW/kg (Calculated) : 47.8  Vital Signs: Temp: 98.9 F (37.2 C) (10/25 0314) Temp Source: Oral (10/25 0314) BP: 106/51 mmHg (10/25 0313) Pulse Rate: 91 (10/25 0313) Intake/Output from previous day: 10/24 0701 - 10/25 0700 In: -  Out: -461  Intake/Output from this shift:    Labs:  Recent Labs  01/07/2014 1035 01/19/14 0145  WBC 31.6* 25.0*  HGB 10.7* 10.3*  PLT 447* 465*  CREATININE 9.24* 4.39*   Estimated Creatinine Clearance: 9.1 ml/min (by C-G formula based on Cr of 4.39). No results found for this basename: VANCOTROUGH, Corlis Leak, VANCORANDOM, [place], [place], [place], [place], [place], TOBRARND, AMIKACINPEAK, AMIKACINTROU, AMIKACIN,  in the last 72 hours   Microbiology: Recent Results (from the past 720 hour(s))  MRSA PCR SCREENING     Status: None   Collection Time    12/31/2013 10:51 PM      Result Value Ref Range Status   MRSA by PCR NEGATIVE  NEGATIVE Final   Comment:            The GeneXpert MRSA Assay (FDA     approved for NASAL specimens     only), is one component of a     comprehensive MRSA colonization     surveillance program. It is not     intended to diagnose MRSA     infection nor to guide or     monitor treatment for     MRSA infections.    Anti-infectives   Start     Dose/Rate Route Frequency Ordered Stop   01/21/14 1800  ceFEPIme (MAXIPIME) 2 g in dextrose 5 % 50 mL IVPB     2 g 100 mL/hr over 30 Minutes Intravenous Every T-Th-Sa (1800) 01/19/14 0826     01/21/14 1200  vancomycin (VANCOCIN) 500 mg in sodium chloride 0.9 % 100 mL IVPB     500 mg 100 mL/hr over 60 Minutes Intravenous Every T-Th-Sa (Hemodialysis) 01/19/14 0826     01/19/14  1800  acyclovir (ZOVIRAX) 240 mg in dextrose 5 % 100 mL IVPB     5 mg/kg  47.8 kg (Ideal) 104.8 mL/hr over 60 Minutes Intravenous Every 24 hours 01/14/2014 1401     01/19/14 1300  vancomycin (VANCOCIN) IVPB 1000 mg/200 mL premix     1,000 mg 200 mL/hr over 60 Minutes Intravenous  Once 01/19/14 0826     01/19/14 1200  ceFEPIme (MAXIPIME) 2 g in dextrose 5 % 50 mL IVPB     2 g 100 mL/hr over 30 Minutes Intravenous  Once 01/19/14 0826     12/29/2013 1430  acyclovir (ZOVIRAX) 240 mg in dextrose 5 % 100 mL IVPB     240 mg 104.8 mL/hr over 60 Minutes Intravenous  Once 12/28/2013 1401 01/01/2014 1842   01/17/2014 1245  vancomycin (VANCOCIN) IVPB 1000 mg/200 mL premix     1,000 mg 200 mL/hr over 60 Minutes Intravenous  Once 01/09/2014 1234 01/23/2014 1525   12/31/2013 1245  ceFEPIme (MAXIPIME) 2 g in dextrose 5 % 50 mL IVPB     2 g 100 mL/hr over 30 Minutes Intravenous  Once 01/11/2014 1234 01/09/2014 1422   01/03/2014 1215  vancomycin (VANCOCIN) IVPB 750 mg/150 ml premix  Status:  Discontinued     750 mg 150 mL/hr over 60 Minutes Intravenous  Once 01/23/2014 1202 01/20/2014 1234      Assessment: 63 yo F presented to the ED on 10/24 with AMS. Elevated WBC 31.6>>25, afebrile with nuchal rigidity. Pharmacy consulted to start broad spectrum antibiotics to r/o sepsis vs meningitis. Pt has ESRD on HD TThS. Received HD 10/24, however, antibiotics were given prior to HD session. Spoke with HD patient was in HD from 1545 - 1915 and vancomycin, cefepime, and acyclovir were charted at 1423, 1327, and 1742 respectively. Will need to redose today.    Cefepime 10/24>> Vancomycin 10/24>> Acyclovir  10/24>>  10/24 BCx>> pending 10/24 UCx>> pending  Goal of Therapy:  Pre-HD level 15-57mcg/mL  Plan:  Cefepime 2g x 1 today, Then Cefepime 2g IV after HD Vancomycin 1g IV x 1 today, then Vancomycin 500mg  IV with HD (TThS) Continue Acyclovir 240mg  (5mg /kg using IBW) q24 after HD Follow HD schedule, dosing put in for usual HD  schedule (TThS); monitor for any changes in schedule warranting changes in med schedule Monitor cultures and clinical progress  Thank you for allowing pharmacy to be part of this patient's care team  Tylor Courtwright M. Karmel Patricelli, Pharm.D Clinical Pharmacy Resident Pager: [phone] 01/19/2014 .8:31 AM

## 2014-01-20 DIAGNOSIS — R8290 Unspecified abnormal findings in urine: Secondary | ICD-10-CM

## 2014-01-20 DIAGNOSIS — I2510 Atherosclerotic heart disease of native coronary artery without angina pectoris: Secondary | ICD-10-CM

## 2014-01-20 DIAGNOSIS — I639 Cerebral infarction, unspecified: Secondary | ICD-10-CM

## 2014-01-20 DIAGNOSIS — D72829 Elevated white blood cell count, unspecified: Secondary | ICD-10-CM

## 2014-01-20 DIAGNOSIS — R197 Diarrhea, unspecified: Secondary | ICD-10-CM

## 2014-01-20 LAB — CBC
HCT: 32.6 % — ABNORMAL LOW (ref 36.0–46.0)
Hemoglobin: 10.4 g/dL — ABNORMAL LOW (ref 12.0–15.0)
MCH: 26.6 pg (ref 26.0–34.0)
MCHC: 31.9 g/dL (ref 30.0–36.0)
MCV: 83.4 fL (ref 78.0–100.0)
Platelets: 460 10*3/uL — ABNORMAL HIGH (ref 150–400)
RBC: 3.91 MIL/uL (ref 3.87–5.11)
RDW: 16.9 % — AB (ref 11.5–15.5)
WBC: 19.7 10*3/uL — AB (ref 4.0–10.5)

## 2014-01-20 LAB — GLUCOSE, CAPILLARY
GLUCOSE-CAPILLARY: 110 mg/dL — AB (ref 70–99)
GLUCOSE-CAPILLARY: 60 mg/dL — AB (ref 70–99)
GLUCOSE-CAPILLARY: 89 mg/dL (ref 70–99)
Glucose-Capillary: 41 mg/dL — CL (ref 70–99)
Glucose-Capillary: 167 mg/dL — ABNORMAL HIGH (ref 70–99)
Glucose-Capillary: 70 mg/dL (ref 70–99)
Glucose-Capillary: 77 mg/dL (ref 70–99)
Glucose-Capillary: 95 mg/dL (ref 70–99)
Glucose-Capillary: 92 mg/dL (ref 70–99)

## 2014-01-20 LAB — BASIC METABOLIC PANEL
Anion gap: 21 — ABNORMAL HIGH (ref 5–15)
BUN: 37 mg/dL — ABNORMAL HIGH (ref 6–23)
CO2: 22 mEq/L (ref 19–32)
Calcium: 9.2 mg/dL (ref 8.4–10.5)
Chloride: 98 mEq/L (ref 96–112)
Creatinine, Ser: 6.01 mg/dL — ABNORMAL HIGH (ref 0.50–1.10)
GFR calc non Af Amer: 7 mL/min — ABNORMAL LOW (ref >90)
GFR, EST AFRICAN AMERICAN: 8 mL/min — AB (ref >90)
Glucose, Bld: 25 mg/dL — CL (ref 70–99)
POTASSIUM: 4.6 meq/L (ref 3.7–5.3)
SODIUM: 141 meq/L (ref 137–147)

## 2014-01-20 MED ORDER — DEXTROSE-NACL 5-0.45 % IV SOLN
INTRAVENOUS | Status: DC
Start: 2014-01-20 — End: 2014-01-22
  Administered 2014-01-20 – 2014-01-21 (×2): via INTRAVENOUS

## 2014-01-20 MED ORDER — DEXTROSE 50 % IV SOLN
50.0000 mL | Freq: Once | INTRAVENOUS | Status: AC | PRN
  Administered 2014-01-20: 50 mL via INTRAVENOUS

## 2014-01-20 MED ORDER — DEXTROSE 50 % IV SOLN
INTRAVENOUS | Status: AC
  Administered 2014-01-20: 50 mL
  Filled 2014-01-20: qty 50

## 2014-01-20 NOTE — Progress Notes (Addendum)
Witnessed pt's family/visitors giving pt drinks via straw. Educated them on importance of not giving anything PO at this time d/t pt's LOC and inability to follow some commands. Provided oral care. Will continue to monitor.

## 2014-01-20 NOTE — Consult Note (Addendum)
WOC wound consult note Reason for Consult: Consult requested for rectal area and right toe. Wound type: Right great toe 100% black eschar; 3X2cm to affected area, no odor or drainage.  According to EMR, ABI was performed previously and is .3: this is inadequate for healing.  It is best practice to leave dry stable eschar intact, topical treatment will not be effective to promote healing.  If aggressive plan is desired, then please consult vascular service. Wound bed: Perirectal area is red and macerated with partial thickness skin loss; appearance consistent with moisture associated skin damage.   Drainage (amount, consistency, odor) Pt is frequently leaking loose stool from rectal area which has caused maceration. Affected area is 3X1X.1cm. Dressing procedure/placement/frequency: Barrier cream to protect and promote healing.  Please re-consult if further assistance is needed.  Thank-you,  Julien Girt MSN, [place], [place], [place], [place]

## 2014-01-20 NOTE — Evaluation (Signed)
Physical Therapy Evaluation Patient Details Name: [name] MRN: [id number] DOB: 11/27/1950 Today's Date: 01/20/2014   History of Present Illness  [name] is a 63 y.o. female with a Past Medical History of ESRD on dialysis (TTS), recent non-STEMI with PCI, hypertension, mild dementia admitted with fever, AMS and progressive decline with lethrgy and increased confusion since last Tuesday.  Per MD note, patient is very frail, lives with her daughter and is very minimally ambulatory. 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  Further evaluation in the emergency room, revealed a leukocytosis of 30,000, chest x-ray was without any pneumonia per MD note.  MRI 01/19/14 showed Three punctate acute nonhemorrhagic infarcts are present. The first in the posterior left lentiform nucleus. The second is in the right parietal white matter.  The third is in the high a posterior left frontal lobe white matter  Clinical Impression  Pt admitted with AMS and decreased mobility. Pt currently with functional limitations due to the deficits listed below (see PT Problem List). Pt requiring +2 max A for bed mobility and sitting EOB. Will do a trial of PT to see if pt participation increases. Pt will benefit from skilled PT to increase their independence and safety with mobility to allow discharge to the venue listed below. PT will continue to follow.       Follow Up Recommendations SNF;Supervision/Assistance - 24 hour    Equipment Recommendations  None recommended by PT    Recommendations for Other Services       Precautions / Restrictions Precautions Precautions: Fall Restrictions Weight Bearing Restrictions: No      Mobility  Bed Mobility Overal bed mobility: Needs Assistance;+2 for physical assistance Bed Mobility: Supine to  Sit;Sit to Supine     Supine to sit: +2 for physical assistance;Total assist Sit to supine: +2 for physical assistance;Total assist   General bed mobility comments: pt fearful with mvmt into sitting so resisting mobility. Required +2 max A to pivot to EOB and pt with posterior left lean.  Transfers                 General transfer comment: not performed due to pain RLE and lack of pt participation  Ambulation/Gait             General Gait Details: unable  Stairs            Wheelchair Mobility    Modified Rankin (Stroke Patients Only) Modified Rankin (Stroke Patients Only) Pre-Morbid Rankin Score: Moderately severe disability Modified Rankin: Severe disability     Balance Overall balance assessment: Needs assistance Sitting-balance support: Bilateral upper extremity supported;Feet supported Sitting balance-Leahy Scale: Poor Sitting balance - Comments: worked on [place] LUE in sitting which helped decrease assistance needed from max to mod. Pt pushing with RUE unless placed in her lap. Left leg supported but right foot kept off floor due to gangrene. Pt falling asleep throughout session which also limited participation. pt not following commands to move head or extremities.  Postural control: Left lateral lean;Posterior lean                                   Pertinent Vitals/Pain Pain Assessment: Faces Faces Pain Scale: Hurts whole lot Pain Location: RLE with mvmt Pain  Descriptors / Indicators: Grimacing Pain Intervention(s): Monitored during session;Repositioned    Home Living Family/patient expects to be discharged to:: Private residence Living Arrangements: Children Available Help at Discharge: Family;Available 24 hours/day Type of Home: Apartment Home Access: Level entry     Home Layout: One level Home Equipment: Walker - 2 wheels;Wheelchair - manual;Shower seat;Hospital bed;Bedside commode Additional Comments: history per chart  from last admission, pt with no verbalization during eval and only occasional nodding of head    Prior Function Level of Independence: Needs assistance   Gait / Transfers Assistance Needed: amb with RW short distance per chart, needed assist for transfers  ADL's / Homemaking Assistance Needed: assisted by sister for bathing, dressing, can self feed, family performs meal prep and housekeeping        Hand Dominance   Dominant Hand: Right    Extremity/Trunk Assessment   Upper Extremity Assessment: Defer to OT evaluation;RUE deficits/detail;LUE deficits/detail;Difficult to assess due to impaired cognition RUE Deficits / Details: pt grasping bed with left hand in sitting, pushing with RUE in sitting, resisting PROM from therapist L>R         Lower Extremity Assessment: RLE deficits/detail;LLE deficits/detail RLE Deficits / Details: pt not following commands, grimaces and cries out with touch of RLE, specifically knee flex and ext which are limited in range. Right foot avoided due to gangrene. Pt did not spontaneously move either LE    Cervical / Trunk Assessment: Kyphotic  Communication   Communication: Receptive difficulties;Expressive difficulties  Cognition Arousal/Alertness: Lethargic Behavior During Therapy: Flat affect Overall Cognitive Status: No family/caregiver present to determine baseline cognitive functioning                      General Comments      Exercises        Assessment/Plan    PT Assessment Patient needs continued PT services  PT Diagnosis Hemiplegia non-dominant side   PT Problem List Decreased strength;Decreased range of motion;Decreased activity tolerance;Decreased balance;Decreased coordination;Decreased mobility;Decreased cognition;Impaired tone;Impaired sensation;Decreased knowledge of precautions;Decreased safety awareness;Decreased knowledge of use of DME  PT Treatment Interventions Functional mobility training;Therapeutic  activities;Therapeutic exercise;Balance training;Neuromuscular re-education;Cognitive remediation;Patient/family education   PT Goals (Current goals can be found in the Care Plan section) Acute Rehab PT Goals Patient Stated Goal: none stated PT Goal Formulation: Patient unable to participate in goal setting Time For Goal Achievement: 02/03/14 Potential to Achieve Goals: Fair    Frequency Min 2X/week   Barriers to discharge        Co-evaluation               End of Session   Activity Tolerance: Patient limited by lethargy Patient left: in bed;with bed alarm set;with call bell/phone within reach Nurse Communication: Mobility status         Time: 1275-1700 PT Time Calculation (min): 14 min   Charges:   PT Evaluation $Initial PT Evaluation Tier I: 1 Procedure PT Treatments $Therapeutic Activity: 8-22 mins   PT G Codes:        Leighton Roach, PT  Acute Rehab Services  [place], [place] 01/20/2014, 1:08 PM

## 2014-01-20 NOTE — Progress Notes (Signed)
I have spoken Ms. Eustache's daughter, Sela Hilding, who had many questions about her mother's condition. I will meet with her tomorrow 10/27 1400 pm but I believe from our initial conversation much reenforcement and conversation will be needed. Thank you for this consult.  Vinie Sill, NP Palliative Medicine Team Pager # [phone] (M-F 8a-5p) Team Phone # [phone] (Nights/Weekends)

## 2014-01-20 NOTE — Progress Notes (Signed)
INFECTIOUS DISEASE PROGRESS NOTE  ID: [name] is a 63 y.o. female with  Active Problems:   CKD (chronic kidney disease) stage V requiring chronic dialysis   Vascular dementia   Hypertensive emergency   NSTEMI (non-ST elevated myocardial infarction)   Malnutrition of moderate degree   Encephalopathy acute  Subjective: Underwent mri that shows evidence of new stroke. Remains encephalopathic  Abtx:  Anti-infectives   Start     Dose/Rate Route Frequency Ordered Stop   01/21/14 1800  ceFEPIme (MAXIPIME) 2 g in dextrose 5 % 50 mL IVPB     2 g 100 mL/hr over 30 Minutes Intravenous Every T-Th-Sa (1800) 01/19/14 0826     01/21/14 1200  vancomycin (VANCOCIN) 500 mg in sodium chloride 0.9 % 100 mL IVPB     500 mg 100 mL/hr over 60 Minutes Intravenous Every T-Th-Sa (Hemodialysis) 01/19/14 0826     01/19/14 1800  acyclovir (ZOVIRAX) 240 mg in dextrose 5 % 100 mL IVPB     5 mg/kg  47.8 kg (Ideal) 104.8 mL/hr over 60 Minutes Intravenous Every 24 hours 01/11/2014 1401     01/19/14 1300  vancomycin (VANCOCIN) IVPB 1000 mg/200 mL premix     1,000 mg 200 mL/hr over 60 Minutes Intravenous  Once 01/19/14 0826 01/19/14 1437   01/19/14 1200  ceFEPIme (MAXIPIME) 2 g in dextrose 5 % 50 mL IVPB     2 g 100 mL/hr over 30 Minutes Intravenous  Once 01/19/14 0826 01/19/14 1312   01/19/2014 1430  acyclovir (ZOVIRAX) 240 mg in dextrose 5 % 100 mL IVPB     240 mg 104.8 mL/hr over 60 Minutes Intravenous  Once 01/19/2014 1401 01/22/2014 1842   12/27/2013 1245  vancomycin (VANCOCIN) IVPB 1000 mg/200 mL premix     1,000 mg 200 mL/hr over 60 Minutes Intravenous  Once 01/09/2014 1234 01/05/2014 1525   01/08/2014 1245  ceFEPIme (MAXIPIME) 2 g in dextrose 5 % 50 mL IVPB     2 g 100 mL/hr over 30 Minutes Intravenous  Once  1234 12/28/2013 1422   01/16/2014 1215  vancomycin (VANCOCIN) IVPB 750 mg/150 ml premix  Status:  Discontinued     750 mg 150 mL/hr over 60 Minutes Intravenous  Once  1202 01/13/2014 1234        Medications:  Scheduled: .  stroke: mapping our early stages of recovery book   Does not apply Once  . acyclovir  5 mg/kg (Ideal) Intravenous Q24H  . antiseptic oral rinse  7 mL Mouth Rinse q12n4p  . aspirin EC  81 mg Oral Daily  . [START ON 01/21/2014] calcitRIOL  0.25 mcg Oral Q T,Th,Sa-HD  . [START ON 01/21/2014] ceFEPime (MAXIPIME) IV  2 g Intravenous Q T,Th,Sat-1800  . chlorhexidine  15 mL Mouth Rinse BID  . clopidogrel  75 mg Oral Q breakfast  . [START ON 01/21/2014] darbepoetin (ARANESP) injection - DIALYSIS  200 mcg Intravenous Q Tue-HD  . heparin  5,000 Units Subcutaneous 3 times per day  . simvastatin  20 mg Oral q1800  . sodium chloride  3 mL Intravenous Q12H  . [START ON 01/21/2014] vancomycin  500 mg Intravenous Q T,Th,Sa-HD    Objective: Vital signs in last 24 hours: Temp:  [97.8 F (36.6 C)-98.3 F (36.8 C)] 98.2 F (36.8 C) (10/26 0700) Pulse Rate:  [80-92] 80 (10/26 0738) Resp:  [11-19] 15 (10/26 0738) BP: (91-144)/(49-53) 130/52 mmHg (10/26 0738) SpO2:  [95 %-100 %] 100 % (10/26 0738)   General  appearance: no distress Neck: she does not flex her neck, she allows some passive lateral rotation of her neck.  Resp: clear to auscultation bilaterally Cardio: regular rate and rhythm GI: normal findings: bowel sounds normal and soft, non-tender Ext: right great toe, darkened c/w dry grangrene Neuro = moves all extremities Lab Results  Recent Labs  01/19/14 0145 01/20/14 0340  WBC 25.0* 19.7*  HGB 10.3* 10.4*  HCT 31.8* 32.6*  NA 143 141  K 4.0 4.6  CL 98 98  CO2 28 22  BUN 23 37*  CREATININE 4.39* 6.01*   Liver Panel  Recent Labs  01/14/2014 1035  PROT 7.8  ALBUMIN 2.1*  AST 35  ALT 14  ALKPHOS 214*  BILITOT 0.2*   Sedimentation Rate No results found for this basename: ESRSEDRATE,  in the last 72 hours C-Reactive Protein No results found for this basename: CRP,  in the last 72 hours  Microbiology: Recent Results (from the past 240  hour(s))  CULTURE, BLOOD (ROUTINE X 2)     Status: None   Collection Time    01/10/2014 12:15 PM      Result Value Ref Range Status   Specimen Description BLOOD RIGHT HAND   Final   Special Requests BOTTLES DRAWN AEROBIC ONLY [place]   Final   Culture  Setup Time     Final   Value: 01/11/2014 18:13     Performed at [facility]   Culture     Final   Value:        BLOOD CULTURE RECEIVED NO GROWTH TO DATE CULTURE WILL BE HELD FOR 5 DAYS BEFORE ISSUING A FINAL NEGATIVE REPORT     Performed at [facility]   Report Status PENDING   Incomplete  CULTURE, BLOOD (ROUTINE X 2)     Status: None   Collection Time    01/05/2014 12:22 PM      Result Value Ref Range Status   Specimen Description BLOOD BLOOD RIGHT FOREARM   Final   Special Requests BOTTLES DRAWN AEROBIC AND ANAEROBIC 5CC EACH   Final   Culture  Setup Time     Final   Value: 01/08/2014 18:14     Performed at [facility]   Culture     Final   Value:        BLOOD CULTURE RECEIVED NO GROWTH TO DATE CULTURE WILL BE HELD FOR 5 DAYS BEFORE ISSUING A FINAL NEGATIVE REPORT     Performed at [facility]   Report Status PENDING   Incomplete  URINE CULTURE     Status: None   Collection Time    01/12/2014  1:33 PM      Result Value Ref Range Status   Specimen Description URINE, CLEAN CATCH   Final   Special Requests NONE   Final   Culture  Setup Time     Final   Value: 01/02/2014 14:20     Performed at [place]     Final   Value: NO GROWTH     Performed at [facility]   Culture     Final   Value: NO GROWTH     Performed at [facility]   Report Status 01/19/2014 FINAL   Final  MRSA PCR SCREENING     Status: None   Collection Time    12/31/2013 10:51 PM      Result Value Ref Range Status   MRSA by  PCR NEGATIVE  NEGATIVE Final   Comment:            The GeneXpert MRSA Assay (FDA     approved for NASAL specimens     only), is one component of a     comprehensive  MRSA colonization     surveillance program. It is not     intended to diagnose MRSA     infection nor to guide or     monitor treatment for     MRSA infections.    Studies/Results: Ct Head Wo Contrast  01/06/2014   CLINICAL DATA:  Altered mental status. Unresponsive patient. History of hypertension, diabetes and congestive heart failure. History of kidney disease.  EXAM: CT HEAD WITHOUT CONTRAST  TECHNIQUE: Contiguous axial images were obtained from the base of the skull through the vertex without intravenous contrast.  COMPARISON:  06/18/2013.  FINDINGS: Ventricles are normal configuration. There is ventricular and sulcal enlargement reflecting moderate atrophy, advanced for this patient's age.  No parenchymal masses or mass effect. No evidence of a recent cortical infarct.  There multiple small foci of hypoattenuation in the basal ganglia and white matter consistent with old lacune infarcts. Patchy white matter hypoattenuation seen more diffusely is consistent with moderate chronic microvascular ischemic change.  No extra-axial masses or abnormal fluid collections.  There is no intracranial hemorrhage.  Moderate left sphenoid sinus mucosal thickening. Remaining visualized sinuses and mastoid air cells are clear. No skull lesion.  IMPRESSION: 1. No acute intracranial abnormalities. 2. Moderate atrophy, advanced for age. Moderate chronic microvascular ischemic change. Small old lacunar infarcts.   Electronically Signed   By: Lajean Manes M.D.   On: 01/07/2014 11:32   Mr Brain Wo Contrast  01/19/2014   CLINICAL DATA:  Acute encephalopathy. Altered mental status. Hypertension and end-stage renal disease with recent non STEMI with PCI.  EXAM: MRI HEAD WITHOUT CONTRAST  TECHNIQUE: Multiplanar, multiecho pulse sequences of the brain and surrounding structures were obtained without intravenous contrast.  COMPARISON:  CT head without contrast 01/17/2014. MRI of the brain 06/05/2013.  FINDINGS: Three  punctate acute nonhemorrhagic infarcts are present. The first in the posterior left lentiform nucleus. The second is in the right parietal white matter on image 20 of series 3. The third is in the high a posterior left frontal lobe white matter on image 23 of series 3.  Multiple remote lacunar infarcts within the corona radiata and basal ganglia are again noted bilaterally. There is extensive white matter and lacunar infarcts within the pons. No acute hemorrhage or mass lesion is present. Advanced atrophy is similar to the prior studies.  Flow is present in the major intracranial arteries. The patient is status post bilateral lens replacements. Mild mucosal thickening is present in the anterior ethmoid air cells. Fluid is present in the left mastoid air cells. No obstructing nasopharyngeal lesion is evident.  IMPRESSION: 1. 3 new punctate areas of acute nonhemorrhagic infarct as described. 2. Advanced cerebral atrophy and white matter disease, likely resulting chronic microvascular ischemic changes and end-stage renal disease. 3. Multiple remote lacunar infarcts of the corona radiata and brainstem.   Electronically Signed   By: Lawrence Santiago M.D.   On: 01/19/2014 11:56   Dg Chest Portable 1 View  01/19/2014   CLINICAL DATA:  63 year old female with altered mental status. Shortness of breath. End-stage renal disease. History of congestive heart failure.  EXAM: PORTABLE CHEST - 1 VIEW  COMPARISON:  Chest x-ray 12/10/2013.  FINDINGS: Lung volumes are low.  No consolidative airspace disease. No pleural effusions. Mild crowding of the pulmonary vasculature, accentuated by low lung volumes, without frank pulmonary edema. Atherosclerosis in the thoracic aorta. Heart size is normal. The patient is rotated to the right on today's exam, resulting in distortion of the mediastinal contours and reduced diagnostic sensitivity and specificity for mediastinal pathology.  IMPRESSION: 1. Low lung volumes without radiographic  evidence of acute cardiopulmonary disease. 2. Atherosclerosis.   Electronically Signed   By: Vinnie Langton M.D.   On: 01/17/2014 11:40     Assessment/Plan: 63yo F with ESRD, CAD s/p PCI, PVD with dry gangrene of right toe presents with Sepsis.   Total days of antibiotics: 3 vanco/cefepime/acyclovir  Leukocytosis =WBC  Is improving from 31 to 20. remains Afebrile. All current blood cx are ngtd, urine cx ngtd  Encephalopathy = possibly multifactorial with infection and new stroke. Recommend to still do LP to rule out CSF infection. Could also be due to hypoglycemia, this morning glucose of 25.   Diarrhea = unclear contributing to process. Still need to send specimen.  dispo = she has multiple co morbidities, concern that medical efforts are not necessarily increasing quality of life. Consider palliative consultation          Carlyle Basques Infectious Diseases (pager) [phone] [email or web address] 01/20/2014, 10:19 AM  LOS: 2 days

## 2014-01-20 NOTE — Progress Notes (Signed)
[facility] TEAM 1 - Stepdown/ICU TEAM Progress Note  [name] GXQ:[id number] DOB: 18-Dec-1950 DOA: 01/15/2014 PCP: Marco Collie, MD  Admit HPI / Brief Narrative: 63 y.o. female with ESRD on dialysis (TTS), recent non-STEMI with PCI, hypertension, and dementia who presented with AMS. Daughter said pt was doing well one week prior to this admission but had become more lethargic and was unable to get out of the bed.  She also reported fevers of 101 F. Daughter did report that pt is minimally ambulatory at baseline.   HPI/Subjective: Pt opens her eyes and appears to look at the examiner but does not respond.  She does not communicate in any meaningful fashion.    Assessment/Plan:  Acute encephalopathy on chronic dementia  - multifactorial:  ?acute infection imposed on vascular dementia, 3 new punctate infarctions noted on brain MRI with evidence of chronic multiple small lacunar strokes, resulting dehydration form poor oral intake  - treat with empiric ABX as recommended by ID - consider LP if family desires ongoing aggressive care  - stroke orders in place as noted below  - will need PT/OT/SLP   Acute nonhemorrhagic strokes  - noted on brain MRI  - stroke orders placed  - will need SLP/PT/OT - hold off on 2 D ECHO and carotid dopplers as they were recently done 05/2013 and as our management would not change given pt's poor overall prognosis given multiple complex medical conditions outlined here  - d/w neurologist Dr. Aram Beecham and he agrees with holding off on 2 D ECHO and carotid dopplers for same reason outline above  - Palliative Care consulted by Nephrology team given poor overall prognosis   ESRD requiring chronic dialysis  - Nephrology team following  Diarrhea - C diff PCR pending - continue empiric contact precautions   Hypertensive emergency in ED  - BP now reasonably controlled - follow trend   Sepsis  - criteria met given tachycardia, leukocytosis, RR > 22 bpm,  hypotension  - ? secondary to UTI - on vancomycin, Cefepime - blood cultures pending, C. Diff pending, urine cx no growth  - appreciate ID input   Severe malnutrition in the context of chronic illness  - secondary to progressive FTT, dementia, acute illness, strokes acute and chronic  - SLP requested   DM with hypoglycemia  - pt has suffered recurrent hypoglycemia necessitating initiation of low volume dextrose IVF - follow trend   Anemia of chronic disease, CKD  - Hg remains stable   Gangrene R foot  - not revasc candidate   Functional quadriplegia  - requires 24 hr care at home - not a good HD candidate  - Palliative Care consulted to help establish goals of care    Code Status: FULL Family Communication: no family present at time of exam Disposition Plan: SDU  Consultants: Nephrology  ID  Procedures: none  Antibiotics: Vancomycin 10/24 >  Maxipime 10/24 >  Acyclovir 10/24 >   DVT prophylaxis: SQ heparin   Objective: Blood pressure 130/52, pulse 80, temperature 97.9 F (36.6 C), temperature source Axillary, resp. rate 15, height _0  (1.549 m), weight 43.5 kg (95 lb 14.4 oz), SpO2 100.00%.  Intake/Output Summary (Last 24 hours) at 01/20/14 1450 Last data filed at 01/19/14 1500  Gross per 24 hour  Intake      0 ml  Output      0 ml  Net      0 ml    Exam: General: No acute respiratory distress Lungs: Clear  to auscultation bilaterally without wheezes or crackles Cardiovascular: Regular rate and rhythm without murmur gallop or rub normal S1 and S2 Abdomen: Nontender, nondistended, soft, bowel sounds positive, no rebound, no ascites, no appreciable mass Extremities: No significant cyanosis, clubbing, or edema bilateral lower extremities - R great toe with dry gangrene (essentially mummified)  Data Reviewed: Basic Metabolic Panel:  Recent Labs Lab 01/11/2014 1035 01/19/14 0145 01/20/14 0340  NA 137 143 141  K 3.7 4.0 4.6  CL 89* 98 98  CO2 _0 GLUCOSE 171* 44* 25*  BUN 70* 23 37*  CREATININE 9.24* 4.39* 6.01*  CALCIUM 9.1 9.4 9.2    Liver Function Tests:  Recent Labs Lab 01/15/2014 1035  AST 35  ALT 14  ALKPHOS 214*  BILITOT 0.2*  PROT 7.8  ALBUMIN 2.1*   Recent Labs Lab 01/13/2014 1245  AMMONIA 35   CBC:  Recent Labs Lab 12/31/2013 1035 01/19/14 0145 01/20/14 0340  WBC 31.6* 25.0* 19.7*  NEUTROABS 28.4*  --   --   HGB 10.7* 10.3* 10.4*  HCT 32.7* 31.8* 32.6*  MCV 82.6 83.2 83.4  PLT 447* 465* 460*    Cardiac Enzymes:  Recent Labs Lab 01/11/2014 2055 01/19/14 0145 01/19/14 0753  TROPONINI <0.30 <0.30 <0.30   CBG:  Recent Labs Lab 01/20/14 0514 01/20/14 0608 01/20/14 0740 01/20/14 1212 01/20/14 1336  GLUCAP 41* 167* 110* 60* 70    Recent Results (from the past 240 hour(s))  CULTURE, BLOOD (ROUTINE X 2)     Status: None   Collection Time    01/14/2014 12:15 PM      Result Value Ref Range Status   Specimen Description BLOOD RIGHT HAND   Final   Special Requests BOTTLES DRAWN AEROBIC ONLY 1CC   Final   Culture  Setup Time     Final   Value: 01/05/2014 18:13     Performed at [facility]   Culture     Final   Value:        BLOOD CULTURE RECEIVED NO GROWTH TO DATE CULTURE WILL BE HELD FOR 5 DAYS BEFORE ISSUING A FINAL NEGATIVE REPORT     Performed at [facility]   Report Status PENDING   Incomplete  CULTURE, BLOOD (ROUTINE X 2)     Status: None   Collection Time    01/23/2014 12:22 PM      Result Value Ref Range Status   Specimen Description BLOOD BLOOD RIGHT FOREARM   Final   Special Requests BOTTLES DRAWN AEROBIC AND ANAEROBIC 5CC EACH   Final   Culture  Setup Time     Final   Value: 12/26/2013 18:14     Performed at [facility]   Culture     Final   Value:        BLOOD CULTURE RECEIVED NO GROWTH TO DATE CULTURE WILL BE HELD FOR 5 DAYS BEFORE ISSUING A FINAL NEGATIVE REPORT     Performed at [facility]   Report Status PENDING   Incomplete  URINE  CULTURE     Status: None   Collection Time    01/15/2014  1:33 PM      Result Value Ref Range Status   Specimen Description URINE, CLEAN CATCH   Final   Special Requests NONE   Final   Culture  Setup Time     Final   Value: 01/08/2014 14:20     Performed at [facility]  Colony Count     Final   Value: NO GROWTH     Performed at [facility]   Culture     Final   Value: NO GROWTH     Performed at [facility]   Report Status 01/19/2014 FINAL   Final  MRSA PCR SCREENING     Status: None   Collection Time    01/12/2014 10:51 PM      Result Value Ref Range Status   MRSA by PCR NEGATIVE  NEGATIVE Final   Comment:            The GeneXpert MRSA Assay (FDA     approved for NASAL specimens     only), is one component of a     comprehensive MRSA colonization     surveillance program. It is not     intended to diagnose MRSA     infection nor to guide or     monitor treatment for     MRSA infections.     Studies:  Recent x-ray studies have been reviewed in detail by the Attending Physician  Scheduled Meds:  Scheduled Meds: .  stroke: mapping our early stages of recovery book   Does not apply Once  . acyclovir  5 mg/kg (Ideal) Intravenous Q24H  . antiseptic oral rinse  7 mL Mouth Rinse q12n4p  . aspirin EC  81 mg Oral Daily  . [START ON 01/21/2014] calcitRIOL  0.25 mcg Oral Q T,Th,Sa-HD  . [START ON 01/21/2014] ceFEPime (MAXIPIME) IV  2 g Intravenous Q T,Th,Sat-1800  . chlorhexidine  15 mL Mouth Rinse BID  . clopidogrel  75 mg Oral Q breakfast  . [START ON 01/21/2014] darbepoetin (ARANESP) injection - DIALYSIS  200 mcg Intravenous Q Tue-HD  . heparin  5,000 Units Subcutaneous 3 times per day  . simvastatin  20 mg Oral q1800  . sodium chloride  3 mL Intravenous Q12H  . [START ON 01/21/2014] vancomycin  500 mg Intravenous Q T,Th,Sa-HD    Time spent on care of this patient: 35 mins   Jaynee Winters T , MD   [facility] Office   [phone] Pager - Text Page per Shea Evans as per below:  On-Call/Text Page:      [email or web address]      password [email or web address]  If 7PM-7AM, please contact night-coverage [email or web address] Password [email or web address] 01/20/2014, 2:50 PM   LOS: 2 days

## 2014-01-20 NOTE — Progress Notes (Signed)
INITIAL NUTRITION ASSESSMENT  DOCUMENTATION CODES Per approved criteria  -Severe malnutrition in the context of chronic illness  Pt meets criteria for severe MALNUTRITION in the context of chronic illness as evidenced by 23% weight loss in 3 months and severe muscle wasting.  INTERVENTION: - Once diet upgraded, add Nepro Shake po TID, each supplement provides 425 kcal and 19 grams protein - RD will continue to monitor for nutrition care plan.  NUTRITION DIAGNOSIS: Inadequate oral intake related to inability to eat as evidenced by NPO.   Goal: Pt to meet >/= 90% of their estimated nutrition needs   Monitor:  Increased nutrient needs related to chronic illness, ESRD as evidenced by estimated nutrition needs.  Reason for Assessment: Malnutrition Screening Tool  63 y.o. female  Admitting Dx: <principal problem not specified>  ASSESSMENT: 63 y.o. female with a Past Medical History of ESRD on dialysis (TTS), recent non-STEMI with PCI, hypertension, mild dementia who presents today with fever and altered mental status. Please note, patient not able to participate in history taking process because of altered mental status.  - Per chart review, pt has lost 29 lbs in the past three months. Pt unable to provide nutritional history. Pt has had Nepro Shakes in the past. Pt with overall poor prognosis. Palliative care has been consulted, but meeting has not yet been scheduled.  - Pt requires chronic dialysis. - Pt followed by SLP.  Nutrition Focused Physical Exam:  Subcutaneous Fat:  Orbital Region: moderate to severe depletion Upper Arm Region: moderate depletion Thoracic and Lumbar Region: moderate depletion  Muscle:  Temple Region: moderate to severe depletion Clavicle Bone Region: moderate to severe depletion Clavicle and Acromion Bone Region: moderate to severe depletion Scapular Bone Region: n/a Dorsal Hand: moderate depletion Patellar Region: moderate to severe  depletion Anterior Thigh Region: moderate to severe depletion Posterior Calf Region: moderate to severe depletion  Edema: none   Height: Ht Readings from Last 1 Encounters:  01/05/2014 5\' 1"  (1.549 m)    Weight: Wt Readings from Last 1 Encounters:  01/19/14 95 lb 14.4 oz (43.5 kg)    Ideal Body Weight: 47.8 kg  % Ideal Body Weight: 91%  Wt Readings from Last 10 Encounters:  01/19/14 95 lb 14.4 oz (43.5 kg)  12/17/13 117 lb 8.1 oz (53.3 kg)  12/17/13 117 lb 8.1 oz (53.3 kg)  12/17/13 117 lb 8.1 oz (53.3 kg)  10/18/13 124 lb (56.246 kg)  10/18/13 124 lb (56.246 kg)  09/19/13 124 lb (56.246 kg)  06/13/13 124 lb (56.246 kg)  06/06/13 124 lb 12.5 oz (56.6 kg)  05/26/13 127 lb 3.3 oz (57.7 kg)    Usual Body Weight: 124 lbs  % Usual Body Weight: 77%  BMI:  Body mass index is 18.13 kg/(m^2).  Estimated Nutritional Needs: Kcal: 1900-2100 Protein: 75-80 g Fluid: Per MD  Skin: stage II pressure ulcer on coccyx, diabetic ulcer on right great toe  Diet Order: NPO  EDUCATION NEEDS: -Education not appropriate at this time   Intake/Output Summary (Last 24 hours) at 01/20/14 1125 Last data filed at 01/19/14 1500  Gross per 24 hour  Intake    700 ml  Output      0 ml  Net    700 ml    Last BM: 10/25   Labs:   Recent Labs Lab 01/09/2014 1035 01/19/14 0145 01/20/14 0340  NA 137 143 141  K 3.7 4.0 4.6  CL 89* 98 98  CO2 20 28 22   BUN 70*  23 37*  CREATININE 9.24* 4.39* 6.01*  CALCIUM 9.1 9.4 9.2  GLUCOSE 171* 44* 25*    CBG (last 3)   Recent Labs  01/20/14 0514 01/20/14 0608 01/20/14 0740  GLUCAP 41* 167* 110*    Scheduled Meds: .  stroke: mapping our early stages of recovery book   Does not apply Once  . acyclovir  5 mg/kg (Ideal) Intravenous Q24H  . antiseptic oral rinse  7 mL Mouth Rinse q12n4p  . aspirin EC  81 mg Oral Daily  . [START ON 01/21/2014] calcitRIOL  0.25 mcg Oral Q T,Th,Sa-HD  . [START ON 01/21/2014] ceFEPime (MAXIPIME) IV  2 g  Intravenous Q T,Th,Sat-1800  . chlorhexidine  15 mL Mouth Rinse BID  . clopidogrel  75 mg Oral Q breakfast  . [START ON 01/21/2014] darbepoetin (ARANESP) injection - DIALYSIS  200 mcg Intravenous Q Tue-HD  . heparin  5,000 Units Subcutaneous 3 times per day  . simvastatin  20 mg Oral q1800  . sodium chloride  3 mL Intravenous Q12H  . [START ON 01/21/2014] vancomycin  500 mg Intravenous Q T,Th,Sa-HD    Continuous Infusions:   Past Medical History  Diagnosis Date  . Diabetes mellitus   . Hypertension   . CHF (congestive heart failure)   . Hyperlipidemia   . End-stage renal disease on hemodialysis     Started dialysis in late 2014. Gets HD in [place] on TTS schedule.     . Thyroid disease   . Depression   . Shortness of breath     at night  . Arthritis   . Dementia   . Stroke     2013, weakness- all over  . Constipation   . Vision disturbance   . End stage renal disease 03/30/2011  . Coronary artery disease     Past Surgical History  Procedure Laterality Date  . Arteriovenous graft placement      left upper extremity  . Tubal ligation      x2  . Av fistula placement Left 07/02/2012    Procedure: ARTERIOVENOUS (AV) FISTULA CREATION;  Surgeon: Conrad Quartzsite, MD;  Location: [facility];  Service: Vascular;  Laterality: Left;  . Av fistula placement Left 10/01/2012    Procedure: INSERTION OF ARTERIOVENOUS (AV) GORE-TEX GRAFT LEFT UPPER ARM;  Surgeon: Conrad [place], MD;  Location: [facility];  Service: Vascular;  Laterality: Left;  Ultrasound guided    Laurette Schimke RD, LDN

## 2014-01-20 NOTE — Progress Notes (Addendum)
[facility] Progress Note   Subjective: More awake, oriented to name and what town she lives in.  WBC trending down.  MRI w/t acute on chronic CVAs  Filed Vitals:   01/20/14 0035 01/20/14 0345 01/20/14 0700 01/20/14 0738  BP: 121/52 144/51  130/52  Pulse: 86 90  80  Temp: 98.2 F (36.8 C) 98.3 F (36.8 C) 98.2 F (36.8 C)   TempSrc: Oral Oral Oral   Resp: 13 12  15   Height:      Weight:      SpO2: 97% 97%  100%   Exam: Frail older adult female, not in distress,  Will make eye contact briefly, followed simple commands to grip bilat No verbal response Chest clear bilat RRR no MRG Abd soft, NTND, no ascites No LE edema, R great toe dry gangrene Neuro is nf, gen weak, nonverbal today  HD: TTS [place]  3hr 3min   46.5kg   F160   350/1.5    2K/2Ca [place]    Profile 2   Heparin none  Aranesp 200 q week, Venofer 50 q week,  Calcitriol 0.25 TIW po       Assessment: 1 AMS - acute on chronic, prob due to infection. Hx of vascular dementia and acute on chronic CVAs on ASA/Plavix 2 Leukocytosis -  Poss UTI (UCx NGF), enteritis, other, on empiric abx Vanc and Cefipime 3 ESRD on HD THS 4 HTN/volume - below dry wt, holding home norvasc/MTP 5 Diarrhea - Cdif pend 6 DM w low BS 7 Anemia cont ESA , hold Fe for now 8 HPTH  - Ca high on admit, holding vit D, phoslo,  9 Gangrene R foot - not revasc candidate, amputate when needed 10 Debility - requires 24 hr care at home. Not good HD candidate. Will consult Pall care team to get involved.  Plan - No new issues.  Next HD tomorrow. Goals of care with palliative medicine.     Recent Labs Lab 01/02/2014 1035 01/19/14 0145 01/20/14 0340  NA 137 143 141  K 3.7 4.0 4.6  CL 89* 98 98  CO2 20 28 22   GLUCOSE 171* 44* 25*  BUN 70* 23 37*  CREATININE 9.24* 4.39* 6.01*  CALCIUM 9.1 9.4 9.2    Recent Labs Lab 01/11/2014 1035  AST 35  ALT 14  ALKPHOS 214*  BILITOT 0.2*  PROT 7.8  ALBUMIN 2.1*    Recent Labs Lab  01/03/2014 1035 01/19/14 0145 01/20/14 0340  WBC 31.6* 25.0* 19.7*  NEUTROABS 28.4*  --   --   HGB 10.7* 10.3* 10.4*  HCT 32.7* 31.8* 32.6*  MCV 82.6 83.2 83.4  PLT 447* 465* 460*   .  stroke: mapping our early stages of recovery book   Does not apply Once  . acyclovir  5 mg/kg (Ideal) Intravenous Q24H  . antiseptic oral rinse  7 mL Mouth Rinse q12n4p  . aspirin EC  81 mg Oral Daily  . [START ON 01/21/2014] calcitRIOL  0.25 mcg Oral Q T,Th,Sa-HD  . [START ON 01/21/2014] ceFEPime (MAXIPIME) IV  2 g Intravenous Q T,Th,Sat-1800  . chlorhexidine  15 mL Mouth Rinse BID  . clopidogrel  75 mg Oral Q breakfast  . [START ON 01/21/2014] darbepoetin (ARANESP) injection - DIALYSIS  200 mcg Intravenous Q Tue-HD  . heparin  5,000 Units Subcutaneous 3 times per day  . simvastatin  20 mg Oral q1800  . sodium chloride  3 mL Intravenous Q12H  . [START ON 01/21/2014] vancomycin  500 mg Intravenous Q T,Th,Sa-HD     sodium chloride, acetaminophen, acetaminophen, albuterol, guaiFENesin-dextromethorphan, ondansetron (ZOFRAN) IV, ondansetron, sodium chloride

## 2014-01-20 NOTE — Evaluation (Signed)
Speech Language Pathology Evaluation Patient Details Name: [name] MRN: [id number] DOB: [birth year] Today's Date: 01/20/2014 Time: 2023-3435 SLP Time Calculation (min): 10 min  Problem List:  Patient Active Problem List   Diagnosis Date Noted  . Encephalopathy acute 01/01/2014  . Chest pain 12/11/2013  . Hypertensive emergency 12/10/2013  . NSTEMI (non-ST elevated myocardial infarction) 12/10/2013  . Leukocytosis 12/10/2013  . Abnormal urinalysis 12/10/2013  . Malnutrition of moderate degree 12/10/2013  . Peripheral vascular disease, unspecified 06/13/2013  . Vascular dementia 06/07/2013  . Acute CVA (cerebrovascular accident) 06/07/2013  . HTN (hypertension) 06/07/2013  . Leukocytosis, unspecified 05/19/2013  . Metabolic encephalopathy 68/61/6837  . Uremic encephalopathy 05/18/2013  . Type II or unspecified type diabetes mellitus without mention of complication, not stated as uncontrolled 05/18/2013  . Chronic kidney disease, stage IV (severe) 06/26/2012  . CKD (chronic kidney disease) stage V requiring chronic dialysis 03/30/2011  . Other complications due to renal dialysis device, implant, and graft 03/30/2011  . Atherosclerosis of native arteries of the extremities with intermittent claudication 02/03/2011   Past Medical History:  Past Medical History  Diagnosis Date  . Diabetes mellitus   . Hypertension   . CHF (congestive heart failure)   . Hyperlipidemia   . End-stage renal disease on hemodialysis     Started dialysis in late 2014. Gets HD in [place] on TTS schedule.     . Thyroid disease   . Depression   . Shortness of breath     at night  . Arthritis   . Dementia   . Stroke     2013, weakness- all over  . Constipation   . Vision disturbance   . End stage renal disease 03/30/2011  . Coronary artery disease    Past Surgical History:  Past Surgical History  Procedure Laterality Date  . Arteriovenous graft placement      left upper extremity  . Tubal  ligation      x2  . Av fistula placement Left 07/02/2012    Procedure: ARTERIOVENOUS (AV) FISTULA CREATION;  Surgeon: Conrad Tangier, MD;  Location: [place];  Service: Vascular;  Laterality: Left;  . Av fistula placement Left 10/01/2012    Procedure: INSERTION OF ARTERIOVENOUS (AV) GORE-TEX GRAFT LEFT UPPER ARM;  Surgeon: Conrad Babbie, MD;  Location: [place];  Service: Vascular;  Laterality: Left;  Ultrasound guided   HPI:  Per MD note, [name] is a 63 y.o. female with a Past Medical History of ESRD on dialysis (TTS), recent non-STEMI with PCI, hypertension, mild dementia admitted with fever, AMS and progressive decline with lethrgy and increased confusion since last Tuesday.  Per MD note, patient is very frail, lives with her daughter and is very minimally ambulatory. 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  Further evaluation in the emergency room, revealed a leukocytosis of 30,000, chest x-ray was without any pneumonia per MD note.  MRI 01/19/14 showed Three punctate acute nonhemorrhagic infarcts are present. The first in the posterior left lentiform nucleus. The second is in the right parietal white matter.  The third is in the high a posterior left frontal lobe white matter. Speech evaluation ordered.     Assessment / Plan / Recommendation Clinical Impression  Pt presents with severe cognitive linguistic impairments that may be exacerbated by sleepiness.  Note per  imaging studies, pt with + 3 hemmorhagic CVAs.    Pt moans only and did not attempt to communicate/gesture/articulate basic needs.  She did not follow SlP commands despite total verbal, visual and tactile cues.  She did nod head yes appropriately approximately 20% of opportunities for basic needs - a probability below chance.     Uncertain to premorbid level of function  given pt's vascular dementia.  Will follow up for skilled SlP intervention to maximize pt's functional cognitive linguistic function to decrease caregiver burden.      SLP Assessment  Patient needs continued Speech Lanaguage Pathology Services    Follow Up Recommendations   (TBD, ? SNF vs CIR ?)    Frequency and Duration min 2x/week  2 weeks   Pertinent Vitals/Pain Pain Assessment: No/denies pain (no symptom of pain)   SLP Goals  Progression toward goals: Not progressing toward goals (comment) Patient/Family Stated Goal: new goals established Potential to Achieve Goals: Fair Potential Considerations: Ability to learn/carryover information;Co-morbidities;Severity of impairments;Previous level of function  SLP Evaluation Prior Functioning  Cognitive/Linguistic Baseline: Baseline deficits Baseline deficit details: pt with baseline dementia, no family present to provide level   Cognition  Overall Cognitive Status: No family/caregiver present to determine baseline cognitive functioning Arousal/Alertness: Awake/alert (pt would frequently close her eyes but quickly responded to slp verbal stimulation) Orientation Level:  (nodding head y/n, pt not oriented to person, place, situation) Attention: Focused Focused Attention: Impaired Focused Attention Impairment: Verbal basic;Functional basic Awareness: Impaired Problem Solving: Impaired Problem Solving Impairment: Functional basic    Comprehension  Auditory Comprehension Overall Auditory Comprehension: Impaired Yes/No Questions: Impaired Basic Biographical Questions: 26-50% accurate Commands: Impaired One Step Basic Commands: 0-24% accurate Conversation: Simple Interfering Components: Attention Visual Recognition/Discrimination Discrimination: Not tested Reading Comprehension Reading Status: Not tested    Expression Expression Primary Mode of Expression:  (pt moans only - no functional articulation/communication) Verbal  Expression Overall Verbal Expression: Impaired Automatic Speech:  (pt did not make attempts with maximal cues) Repetition: Impaired (no attempts) Level of Impairment: Word level Written Expression Dominant Hand: Right Written Expression: Not tested   Oral / Motor Oral Motor/Sensory Function Overall Oral Motor/Sensory Function:  (pt did not follow commands during evaluation, able to seal lips on oral suction) Motor Speech Respiration: Impaired Level of Impairment:  (phonatory level - moaning) Phonation: Low vocal intensity Intelligibility:  (pt made no attempts to articulate)   [place], [place] [facility] [facility] SLP [phone]

## 2014-01-21 DIAGNOSIS — I96 Gangrene, not elsewhere classified: Secondary | ICD-10-CM

## 2014-01-21 DIAGNOSIS — G9340 Encephalopathy, unspecified: Secondary | ICD-10-CM

## 2014-01-21 DIAGNOSIS — R6520 Severe sepsis without septic shock: Secondary | ICD-10-CM

## 2014-01-21 DIAGNOSIS — F0390 Unspecified dementia without behavioral disturbance: Secondary | ICD-10-CM

## 2014-01-21 DIAGNOSIS — I639 Cerebral infarction, unspecified: Secondary | ICD-10-CM

## 2014-01-21 DIAGNOSIS — E119 Type 2 diabetes mellitus without complications: Secondary | ICD-10-CM

## 2014-01-21 DIAGNOSIS — R400 Somnolence: Secondary | ICD-10-CM

## 2014-01-21 LAB — COMPREHENSIVE METABOLIC PANEL
ALT: 14 U/L (ref 0–35)
ANION GAP: 21 — AB (ref 5–15)
AST: 23 U/L (ref 0–37)
Albumin: 2 g/dL — ABNORMAL LOW (ref 3.5–5.2)
Alkaline Phosphatase: 201 U/L — ABNORMAL HIGH (ref 39–117)
BUN: 47 mg/dL — AB (ref 6–23)
CALCIUM: 9.3 mg/dL (ref 8.4–10.5)
CO2: 23 meq/L (ref 19–32)
CREATININE: 7.33 mg/dL — AB (ref 0.50–1.10)
Chloride: 97 mEq/L (ref 96–112)
GFR, EST AFRICAN AMERICAN: 6 mL/min — AB (ref >90)
GFR, EST NON AFRICAN AMERICAN: 5 mL/min — AB (ref >90)
GLUCOSE: 70 mg/dL (ref 70–99)
Potassium: 4.4 mEq/L (ref 3.7–5.3)
Sodium: 141 mEq/L (ref 137–147)
TOTAL PROTEIN: 7.5 g/dL (ref 6.0–8.3)
Total Bilirubin: <0.2 mg/dL — ABNORMAL LOW (ref 0.3–1.2)

## 2014-01-21 LAB — GLUCOSE, CAPILLARY
GLUCOSE-CAPILLARY: 121 mg/dL — AB (ref 70–99)
GLUCOSE-CAPILLARY: 71 mg/dL (ref 70–99)
GLUCOSE-CAPILLARY: 92 mg/dL (ref 70–99)
Glucose-Capillary: 77 mg/dL (ref 70–99)
Glucose-Capillary: 78 mg/dL (ref 70–99)
Glucose-Capillary: 126 mg/dL — ABNORMAL HIGH (ref 70–99)
Glucose-Capillary: 67 mg/dL — ABNORMAL LOW (ref 70–99)
Glucose-Capillary: 120 mg/dL — ABNORMAL HIGH (ref 70–99)
Glucose-Capillary: 116 mg/dL — ABNORMAL HIGH (ref 70–99)
Glucose-Capillary: 119 mg/dL — ABNORMAL HIGH (ref 70–99)
Glucose-Capillary: 91 mg/dL (ref 70–99)

## 2014-01-21 LAB — CBC
HCT: 32.4 % — ABNORMAL LOW (ref 36.0–46.0)
HEMOGLOBIN: 10.3 g/dL — AB (ref 12.0–15.0)
MCH: 26.3 pg (ref 26.0–34.0)
MCHC: 31.8 g/dL (ref 30.0–36.0)
MCV: 82.7 fL (ref 78.0–100.0)
Platelets: 484 10*3/uL — ABNORMAL HIGH (ref 150–400)
RBC: 3.92 MIL/uL (ref 3.87–5.11)
RDW: 16.7 % — ABNORMAL HIGH (ref 11.5–15.5)
WBC: 14 10*3/uL — ABNORMAL HIGH (ref 4.0–10.5)

## 2014-01-21 LAB — TROPONIN I: Troponin I: <0.3 ng/mL (ref <0.30)

## 2014-01-21 MED ORDER — SODIUM CHLORIDE 0.9 % IV BOLUS (SEPSIS): 250.0000 mL | Freq: Once | INTRAVENOUS | Status: DC

## 2014-01-21 MED ORDER — DEXTROSE 50 % IV SOLN: 25.0000 mL | Freq: Once | INTRAVENOUS | Status: AC | PRN

## 2014-01-21 MED ORDER — DEXTROSE 50 % IV SOLN
INTRAVENOUS | Status: AC
  Administered 2014-01-21: 25 mL
  Filled 2014-01-21: qty 50

## 2014-01-21 MED ORDER — SODIUM CHLORIDE 0.9 % IV BOLUS (SEPSIS)
250.0000 mL | Freq: Once | INTRAVENOUS | Status: AC
  Administered 2014-01-21: 250 mL via INTRAVENOUS

## 2014-01-21 NOTE — Progress Notes (Signed)
Paged Dr Sherral Hammers concerning order for LP ASAP. Pt recently left for dialysis. Dr Sherral Hammers says it is okay to do LP in the morning.

## 2014-01-21 NOTE — Progress Notes (Signed)
[facility] TEAM 1 - Stepdown/ICU TEAM Progress Note  [name] DJM:[id number] DOB: [birth year] DOA: 01/17/2014 PCP: Marco Collie, MD  Admit HPI / Brief Narrative: 63 y.o. BF PMHx , Hx CVA, ESRD on dialysis T/Th/Sat, CHF, HLD, recent non-STEMI with PCI, hypertension, Dementia, Hx CVA 2013 with residual generalized weakness,  who presented with AMS,. Daughter said pt was doing well one week prior to this admission but had become more lethargic and was unable to get out of the bed. She also reported fevers of 101 F. Daughter did report that pt is minimally ambulatory at baseline   HPI/Subjective: 10/27 patient will open eyes to significant stimuli, and calling her name [name], appears to follow some commands.   Assessment/Plan: Acute encephalopathy on chronic dementia  - multifactorial: ?acute infection imposed on vascular dementia, 3 new punctate infarctions noted on brain MRI with evidence of chronic multiple small lacunar strokes, resulting dehydration form poor oral intake  - treat with empiric ABX as recommended by ID  - consider LP if family desires ongoing aggressive care  - stroke orders in place as noted below  - will need PT/OT/SLP pending   Acute nonhemorrhagic strokes  - noted on brain MRI  - stroke orders placed  -  hold off on 2 D ECHO and carotid dopplers as they were recently done 05/2013 and as our management would not change given pt's poor overall prognosis given multiple complex medical conditions outlined here  - d/w neurologist Dr. Aram Beecham and he agrees with holding off on 2 D ECHO and carotid dopplers for same reason outline above  - Palliative Care consulted by Nephrology team given poor overall prognosis   ESRD requiring chronic dialysisT/Th/Sat,  - Nephrology team following   Diarrhea  - C diff PCR pending - continue empiric contact precautions   Hypertensive emergency in ED  -  patient hypotensive for new stroke for new stroke patient. -Normal saline  250 mL bolus, consider placing on dopamine -Would like to maintain SBP>140    Sepsis  - criteria met given tachycardia, leukocytosis, RR > 22 bpm, hypotension  -  Most likely source is patient's gangrenous right foot  -Continue broad-spectrum antibiotics  - blood cultures pending, C. Diff pending, urine cx no growth  -  per ID recommend LP; will consult neurology; have also placed or for LP by IR    Severe malnutrition in the context of chronic illness  - secondary to progressive FTT, dementia, acute illness, strokes acute and chronic  - SLP requested  -Place NG tube, consult dietitian for feeding  DM with hypoglycemia  - pt has suffered recurrent hypoglycemia necessitating initiation of low volume dextrose IVF - follow trend   Anemia of chronic disease, CKD  - Hg remains stable   Gangrene Rt foot  - not a surgical candidate  Functional quadriplegia  - requires 24 hr care at home - not a good HD candidate  - Palliative Care consulted to help establish goals of care    Code Status: FULL  Family Communication: family present at time of exam; had extensive conversation detailing the likely poor outcome.  Disposition Plan: SDU    Consultants: Neurology consult pending   Procedure/Significant Events: 10/25 MRI brain;3 new punctate areas of acute nonhemorrhagic infarct  -Multiple remote lacunar infarcts of the corona radiata and brainstem.    Culture 10/24 blood right hand/forearm NGTD 10/24 urine negative 10/24 MRSA by PCR negative   Antibiotics: Acyclovir 10/25>> Cefepime 10/27>> Vancomycin 10/27 T/Th/Sat at HD  DVT prophylaxis: Plavix and heparin subcutaneous   Devices    LINES / TUBES:      Continuous Infusions: . dextrose 5 % and 0.45% NaCl 30 mL/hr at 01/20/14 2300    Objective: VITAL SIGNS: Temp: 97.6 F (36.4 C) (10/27 1139) Temp Source: Axillary (10/27 1139) BP: 129/53 mmHg (10/27 0747) Pulse Rate: 83 (10/27 0747) SPO2; 100% on room  air FIO2:   Intake/Output Summary (Last 24 hours) at 01/21/14 1554 Last data filed at 01/21/14 1300  Gross per 24 hour  Intake    760 ml  Output      1 ml  Net    759 ml     Exam: General: Opens eyes to stimulation and calling her name [name], appears to follow some commands, No acute respiratory distress Lungs: Clear to auscultation bilaterally without wheezes or crackles Cardiovascular: Regular rate and rhythm without murmur gallop or rub normal S1 and S2 Abdomen: Nontender, nondistended, soft, bowel sounds positive, no rebound, no ascites, no appreciable mass Extremities: No significant cyanosis, clubbing, or edema bilateral lower extremities Neurologic; cranial nerves II through XII intact, uvula midline, appears to move bilateral upper extremities to command strength 2/5, with draws to pain stimuli, spontaneously will move bilateral lower extremities (not to command), with jaw is too painful stimuli  Data Reviewed: Basic Metabolic Panel:  Recent Labs Lab 01/23/2014 1035 01/19/14 0145 01/20/14 0340 01/21/14 0353  NA 137 143 141 141  K 3.7 4.0 4.6 4.4  CL 89* 98 98 97  CO2 20 28 22 23   GLUCOSE 171* 44* 25* 70  BUN 70* 23 37* 47*  CREATININE 9.24* 4.39* 6.01* 7.33*  CALCIUM 9.1 9.4 9.2 9.3   Liver Function Tests:  Recent Labs Lab 01/13/2014 1035 01/21/14 0353  AST 35 23  ALT 14 14  ALKPHOS 214* 201*  BILITOT 0.2* <0.2*  PROT 7.8 7.5  ALBUMIN 2.1* 2.0*   No results found for this basename: LIPASE, AMYLASE,  in the last 168 hours  Recent Labs Lab 01/13/2014 1245  AMMONIA 35   CBC:  Recent Labs Lab 01/02/2014 1035 01/19/14 0145 01/20/14 0340 01/21/14 0353  WBC 31.6* 25.0* 19.7* 14.0*  NEUTROABS 28.4*  --   --   --   HGB 10.7* 10.3* 10.4* 10.3*  HCT 32.7* 31.8* 32.6* 32.4*  MCV 82.6 83.2 83.4 82.7  PLT 447* 465* 460* 484*   Cardiac Enzymes:  Recent Labs Lab 01/15/2014 2055 01/19/14 0145 01/19/14 0753  TROPONINI <0.30 <0.30 <0.30   BNP (last 3  results) No results found for this basename: PROBNP,  in the last 8760 hours CBG:  Recent Labs Lab 01/21/14 0730 01/21/14 0807 01/21/14 1047 01/21/14 1253 01/21/14 1356  GLUCAP 67* 126* 120* 121* 116*    Recent Results (from the past 240 hour(s))  CULTURE, BLOOD (ROUTINE X 2)     Status: None   Collection Time    12/29/2013 12:15 PM      Result Value Ref Range Status   Specimen Description BLOOD RIGHT HAND   Final   Special Requests BOTTLES DRAWN AEROBIC ONLY 1CC   Final   Culture  Setup Time     Final   Value: 12/27/2013 18:13     Performed at [facility]   Culture     Final   Value:        BLOOD CULTURE RECEIVED NO GROWTH TO DATE CULTURE WILL BE HELD FOR 5 DAYS BEFORE ISSUING A FINAL NEGATIVE REPORT  Performed at [facility]   Report Status PENDING   Incomplete  CULTURE, BLOOD (ROUTINE X 2)     Status: None   Collection Time    12/29/2013 12:22 PM      Result Value Ref Range Status   Specimen Description BLOOD BLOOD RIGHT FOREARM   Final   Special Requests BOTTLES DRAWN AEROBIC AND ANAEROBIC 5CC EACH   Final   Culture  Setup Time     Final   Value: 01/11/2014 18:14     Performed at [facility]   Culture     Final   Value:        BLOOD CULTURE RECEIVED NO GROWTH TO DATE CULTURE WILL BE HELD FOR 5 DAYS BEFORE ISSUING A FINAL NEGATIVE REPORT     Performed at [facility]   Report Status PENDING   Incomplete  URINE CULTURE     Status: None   Collection Time    01/10/2014  1:33 PM      Result Value Ref Range Status   Specimen Description URINE, CLEAN CATCH   Final   Special Requests NONE   Final   Culture  Setup Time     Final   Value: 01/22/2014 14:20     Performed at [facility]     Final   Value: NO GROWTH     Performed at [facility]   Culture     Final   Value: NO GROWTH     Performed at [facility]   Report Status 01/19/2014 FINAL   Final  MRSA PCR SCREENING     Status: None    Collection Time    01/11/2014 10:51 PM      Result Value Ref Range Status   MRSA by PCR NEGATIVE  NEGATIVE Final   Comment:            The GeneXpert MRSA Assay (FDA     approved for NASAL specimens     only), is one component of a     comprehensive MRSA colonization     surveillance program. It is not     intended to diagnose MRSA     infection nor to guide or     monitor treatment for     MRSA infections.     Studies:  Recent x-ray studies have been reviewed in detail by the Attending Physician  Scheduled Meds:  Scheduled Meds: .  stroke: mapping our early stages of recovery book   Does not apply Once  . acyclovir  5 mg/kg (Ideal) Intravenous Q24H  . antiseptic oral rinse  7 mL Mouth Rinse q12n4p  . aspirin EC  81 mg Oral Daily  . calcitRIOL  0.25 mcg Oral Q T,Th,Sa-HD  . ceFEPime (MAXIPIME) IV  2 g Intravenous Q T,Th,Sat-1800  . chlorhexidine  15 mL Mouth Rinse BID  . clopidogrel  75 mg Oral Q breakfast  . darbepoetin (ARANESP) injection - DIALYSIS  200 mcg Intravenous Q Tue-HD  . heparin  5,000 Units Subcutaneous 3 times per day  . simvastatin  20 mg Oral q1800  . vancomycin  500 mg Intravenous Q T,Th,Sa-HD    Time spent on care of this patient: 40 mins   Allie Bossier , MD   [facility] Office  [phone] Pager [phone]  On-Call/Text Page:      [email or web address]      password [email or web address]  If 7PM-7AM, please contact night-coverage [email or web address]  Password [email or web address] 01/21/2014, 3:54 PM   LOS: 3 days

## 2014-01-21 NOTE — Consult Note (Signed)
Patient IP:JASNKN GYNETH HUBKA      DOB: Sep 08, 1950      LZJ:[id number]     Consult Note from the Palliative Medicine Team at [place] Requested by: Dr. Jonnie Finner     PCP: Marco Collie, MD Reason for Consultation: [place], multiple medical issues  Phone [phone]  Assessment of patients Current state: I met today and had a thorough discussion with her daughter and granddaughter, [name] and [place], at bedside. They did not have a good understanding about Ms. Hemler's overall poor prognosis and how dire her situation is. I feel that they have a better understanding now as they were tearful throughout our conversation and are considering DNR status. They were receptive to our conversation. They remain hopeful for improved mental status and are not prepared for focus on comfort and wish for [place] to continue full aggressive care at this point. I encouraged them to consider what they are asking Ms. Wahba to go through and consider the realistic expectations that will be gained through these interventions. I will continue to work and speak with Ms. Kluth's family.    Goals of Care: 1.  Code Status: FULL   2. Scope of Treatment: Continue to provide all available and offered medical interventions. They are considering DNR.    4. Disposition: To be determined on outcomes.    3. Symptom Management:   1. Fever: Acetaminophen prn.  2. Nausea/Vomiting: Ondansetron prn.  3. Weakness: Continue medical management. SLP following to assess neurological readiness for swallow.   4. Psychosocial: Emotional support provided to family at bedside during difficult conversation.    Brief HPI: 63 yo female admitted with altered mental status likely related to infectious source and acute nonhemorrhagic infarcts as seen on MRI 01/19/14. She has also had recurrent episodes of hypoglycemia and has remained NPO d/t continued encephalopathy. PMH significant for dementia, diabetes mellitus, CHF, CAD,  hyperlipidemia, ESRD on hemodialysis, depression, arthritis, stroke.    ROS: Unable to assess - encephalopathy.     PMH:  Past Medical History  Diagnosis Date  . Diabetes mellitus   . Hypertension   . CHF (congestive heart failure)   . Hyperlipidemia   . End-stage renal disease on hemodialysis     Started dialysis in late 2014. Gets HD in [place] on TTS schedule.     . Thyroid disease   . Depression   . Shortness of breath     at night  . Arthritis   . Dementia   . Stroke     2013, weakness- all over  . Constipation   . Vision disturbance   . End stage renal disease 03/30/2011  . Coronary artery disease      PSH: Past Surgical History  Procedure Laterality Date  . Arteriovenous graft placement      left upper extremity  . Tubal ligation      x2  . Av fistula placement Left 07/02/2012    Procedure: ARTERIOVENOUS (AV) FISTULA CREATION;  Surgeon: Conrad Galt, MD;  Location: [facility];  Service: Vascular;  Laterality: Left;  . Av fistula placement Left 10/01/2012    Procedure: INSERTION OF ARTERIOVENOUS (AV) GORE-TEX GRAFT LEFT UPPER ARM;  Surgeon: Conrad Kermit, MD;  Location: [facility];  Service: Vascular;  Laterality: Left;  Ultrasound guided   I have reviewed the [place] and SH and  If appropriate update it with new information. Allergies  Allergen Reactions  . Phenergan [Promethazine Hcl]     unknown  Scheduled Meds: .  stroke: mapping our early stages of recovery book   Does not apply Once  . acyclovir  5 mg/kg (Ideal) Intravenous Q24H  . antiseptic oral rinse  7 mL Mouth Rinse q12n4p  . aspirin EC  81 mg Oral Daily  . calcitRIOL  0.25 mcg Oral Q T,Th,Sa-HD  . ceFEPime (MAXIPIME) IV  2 g Intravenous Q T,Th,Sat-1800  . chlorhexidine  15 mL Mouth Rinse BID  . clopidogrel  75 mg Oral Q breakfast  . darbepoetin (ARANESP) injection - DIALYSIS  200 mcg Intravenous Q Tue-HD  . heparin  5,000 Units Subcutaneous 3 times per day  . simvastatin  20 mg Oral q1800  . vancomycin  500  mg Intravenous Q T,Th,Sa-HD   Continuous Infusions: . dextrose 5 % and 0.45% NaCl 30 mL/hr at 01/20/14 2300   PRN Meds:.acetaminophen, acetaminophen, albuterol, dextrose, guaiFENesin-dextromethorphan, ondansetron (ZOFRAN) IV, ondansetron    BP 129/53  Pulse 83  Temp(Src) 97.6 F (36.4 C) (Axillary)  Resp 13  Ht 5' 1"  (1.549 m)  Wt 43.5 kg (95 lb 14.4 oz)  BMI 18.13 kg/m2  SpO2 100%   PPS: 20%   Intake/Output Summary (Last 24 hours) at 01/21/14 1410 Last data filed at 01/21/14 1300  Gross per 24 hour  Intake    790 ml  Output      1 ml  Net    789 ml   LBM: 01/21/14  Physical Exam:  General: NAD, thin, frail HEENT: Temporal muscle wasting Chest: CTA throughout, no labored breathing, symmetric CVS: RRR, S1 S2 Abdomen: Soft, NT, ND, +BS Ext: MAE, no edema, right great toe black eschar Neuro: Arouses to voice but only for a minute, oriented to person, unable to follow commands  Labs: CBC    Component Value Date/Time   WBC 14.0* 01/21/2014 0353   RBC 3.92 01/21/2014 0353   RBC 2.72* 12/11/2013 0830   HGB 10.3* 01/21/2014 0353   HCT 32.4* 01/21/2014 0353   PLT 484* 01/21/2014 0353   MCV 82.7 01/21/2014 0353   MCH 26.3 01/21/2014 0353   MCHC 31.8 01/21/2014 0353   RDW 16.7* 01/21/2014 0353   LYMPHSABS 1.6 01/10/2014 1035   MONOABS 1.6* 01/23/2014 1035   EOSABS 0.0 01/06/2014 1035   BASOSABS 0.0 12/28/2013 1035    BMET    Component Value Date/Time   NA 141 01/21/2014 0353   K 4.4 01/21/2014 0353   CL 97 01/21/2014 0353   CO2 23 01/21/2014 0353   GLUCOSE 70 01/21/2014 0353   BUN 47* 01/21/2014 0353   CREATININE 7.33* 01/21/2014 0353   CALCIUM 9.3 01/21/2014 0353   GFRNONAA 5* 01/21/2014 0353   GFRAA 6* 01/21/2014 0353    CMP     Component Value Date/Time   NA 141 01/21/2014 0353   K 4.4 01/21/2014 0353   CL 97 01/21/2014 0353   CO2 23 01/21/2014 0353   GLUCOSE 70 01/21/2014 0353   BUN 47* 01/21/2014 0353   CREATININE 7.33* 01/21/2014 0353    CALCIUM 9.3 01/21/2014 0353   PROT 7.5 01/21/2014 0353   ALBUMIN 2.0* 01/21/2014 0353   AST 23 01/21/2014 0353   ALT 14 01/21/2014 0353   ALKPHOS 201* 01/21/2014 0353   BILITOT <0.2* 01/21/2014 0353   GFRNONAA 5* 01/21/2014 0353   GFRAA 6* 01/21/2014 0353     Time In Time Out Total Time Spent with Patient Total Overall Time  1400 1530 64mn 932m    Greater than 50%  of this  time was spent counseling and coordinating care related to the above assessment and plan.  Vinie Sill, NP Palliative Medicine Team Pager # [phone] (M-F 8a-5p) Team Phone # [phone] (Nights/Weekends)

## 2014-01-21 NOTE — Evaluation (Signed)
Clinical/Bedside Swallow Evaluation Patient Details  Name: [name] MRN: [id number] Date of Birth: [birth year]  Today's Date: 01/21/2014 Time: 5188-4166 SLP Time Calculation (min): 20 min  Past Medical History:  Past Medical History  Diagnosis Date  . Diabetes mellitus   . Hypertension   . CHF (congestive heart failure)   . Hyperlipidemia   . End-stage renal disease on hemodialysis     Started dialysis in late 2014. Gets HD in [place] on TTS schedule.     . Thyroid disease   . Depression   . Shortness of breath     at night  . Arthritis   . Dementia   . Stroke     2013, weakness- all over  . Constipation   . Vision disturbance   . End stage renal disease 03/30/2011  . Coronary artery disease    Past Surgical History:  Past Surgical History  Procedure Laterality Date  . Arteriovenous graft placement      left upper extremity  . Tubal ligation      x2  . Av fistula placement Left 07/02/2012    Procedure: ARTERIOVENOUS (AV) FISTULA CREATION;  Surgeon: Conrad Bonaparte, MD;  Location: [facility];  Service: Vascular;  Laterality: Left;  . Av fistula placement Left 10/01/2012    Procedure: INSERTION OF ARTERIOVENOUS (AV) GORE-TEX GRAFT LEFT UPPER ARM;  Surgeon: Conrad [place], MD;  Location: [facility];  Service: Vascular;  Laterality: Left;  Ultrasound guided   HPI:  Per MD note, [name] is a 63 y.o. female with a Past Medical History of ESRD on dialysis (TTS), recent non-STEMI with PCI, hypertension, mild dementia admitted with fever, AMS and progressive decline with lethrgy and increased confusion since last Tuesday.  Per MD note, patient is very frail, lives with her daughter and is very minimally ambulatory. 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  Further evaluation in the emergency room, revealed a leukocytosis of  30,000, chest x-ray was without any pneumonia per MD note.  MRI 01/19/14 showed Three punctate acute nonhemorrhagic infarcts are present. The first in the posterior left lentiform nucleus. The second is in the right parietal white matter.  The third is in the high a posterior left frontal lobe white matter. Speech evaluation ordered.     Assessment / Plan / Recommendation Clinical Impression  Pt's ability to consume po limited by sensorimotor impairment suspected due to neuro event exacerbated by lethargy.  Pt opened her eyes but did not follow commands during therapy session.    She was able to seal lips bilaterally in attempts to avoid oral suctioning indicating intact labial closure.  Tsp of applejuice provided by SlP with pt orally holding for extended amount of time - followed by weak cough.  Suspect aspiration prior to swallow as oral bolus prematurely spilled into pharynx/larynx.    Recommend pt remain npo with adequate oral care and SLP follow up for po readiness.    Note palliative meeting scheduled for today and reviewed RN note indicating family giving pt liquid via straw (placing liquid-filled straw in pt's mouth).      Aspiration Risk  Severe    Diet Recommendation NPO   Medication Administration: Via alternative means    Other  Recommendations   n/a  Follow Up Recommendations   (TBD, ? SNF)  Frequency and Duration min 2x/week  2 weeks   Pertinent Vitals/Pain Afebrile, decreased     Swallow Study Prior Functional Status   see [place] Date of Onset: 01/20/14 HPI: Per MD note, [name] is a 63 y.o. female with a Past Medical History of ESRD on dialysis (TTS), recent non-STEMI with PCI, hypertension, mild dementia admitted with fever, AMS and progressive decline with lethrgy and increased confusion since last Tuesday.  Per MD note, patient is very frail, lives with her daughter and is very minimally ambulatory. 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  Further evaluation in the emergency room, revealed a leukocytosis of 30,000, chest x-ray was without any pneumonia per MD note.  MRI 01/19/14 showed Three punctate acute nonhemorrhagic infarcts are present. The first in the posterior left lentiform nucleus. The second is in the right parietal white matter.  The third is in the high a posterior left frontal lobe white matter. Speech evaluation ordered.   Type of Study: Bedside swallow evaluation Diet Prior to this Study: NPO Temperature Spikes Noted: No Respiratory Status: Room air History of Recent Intubation: No Behavior/Cognition: Lethargic;Decreased sustained attention;Doesn't follow directions Oral Cavity - Dentition: Adequate natural dentition Self-Feeding Abilities: Total assist Patient Positioning: Upright in bed Baseline Vocal Quality:  (pt aphonic today) Volitional Cough: Cognitively unable to elicit Volitional Swallow: Unable to elicit    Oral/Motor/Sensory Function Overall Oral Motor/Sensory Function:  (pt did not follow commands during evaluation, able to seal lips on oral suction)   Ice Chips Ice chips: Not tested   Thin Liquid Thin Liquid: Impaired Presentation: Spoon Oral Phase Impairments: Reduced labial seal;Reduced lingual movement/coordination;Impaired anterior to posterior transit Oral Phase Functional Implications: Oral holding Pharyngeal  Phase Impairments: Suspected delayed Swallow;Cough - Delayed Other Comments: excessive oral holding with delayed nonproductive cough- concerning for OVERT aspiration    Nectar Thick Nectar Thick Liquid: Not tested   Honey Thick Honey Thick Liquid: Not tested   Puree Puree: Not tested   Solid   GO    Solid: Not tested       Luanna Salk, [place] [facility] SLP [phone]

## 2014-01-21 NOTE — Evaluation (Signed)
Occupational Therapy Evaluation and Discharge Patient Details Name: [name] MRN: [id number] DOB: Jun 23, 1950 Today's Date: 01/21/2014    History of Present Illness [name] is a 63 y.o. female with a Past Medical History of ESRD on dialysis (TTS), recent non-STEMI with PCI, hypertension, mild dementia admitted with fever, AMS and progressive decline with lethrgy and increased confusion since last Tuesday.  Per MD note, patient is very frail, lives with her daughter and is very minimally ambulatory. 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  Further evaluation in the emergency room, revealed a leukocytosis of 30,000, chest x-ray was without any pneumonia per MD note.  MRI 01/19/14 showed Three punctate acute nonhemorrhagic infarcts are present. The first in the posterior left lentiform nucleus. The second is in the right parietal white matter.  The third is in the high a posterior left frontal lobe white matter   Clinical Impression   This 63 yo female admitted with above presents to acute OT with decreased cognition, decreased balance, decreased safety awareness, increased pain with movement of RLE, increased fear of falling (her sense of midline is altered) all affecting her ability to do more for herself and increasing burden of care on family. Feel she could possibly benefit from OT at SNF. Acute OT will sign off.    Follow Up Recommendations  SNF    Equipment Recommendations  None recommended by OT       Precautions / Restrictions Precautions Precautions: Fall Restrictions Weight Bearing Restrictions: No      Mobility Bed Mobility Overal bed mobility: Needs Assistance Bed Mobility: Supine to Sit     Supine to sit: Total assist;HOB elevated (and using pad underneath her)        Transfers Overall  transfer level: Needs assistance   Transfers: Squat Pivot Transfers     Squat pivot transfers: Total assist;+2 physical assistance (bed to recliner going to pt's left--she did not A at all)          Balance Overall balance assessment: Needs assistance Sitting-balance support: Feet supported;Single extremity supported Sitting balance-Leahy Scale: Zero                                      ADL                                         General ADL Comments: Needed total A pta but could A with bed mobility and transfers (which she is not doing now)     Vision  grand-daughter reports that pt's vision has been bad for years-but cannot elaborate on what this means.                          Pertinent Vitals/Pain Pain Assessment: Faces Faces Pain Scale: Hurts whole lot Pain Location: RLE with attempting to work on knee extension Pain Descriptors / Indicators: Grimacing Pain Intervention(s): Limited activity within patient's tolerance;Monitored during session;Repositioned     Hand Dominance Right   Extremity/Trunk Assessment Upper Extremity Assessment Upper Extremity Assessment: Generalized weakness RUE Deficits / Details: Pt moving both arms spontaneously (left greater than right),  not moving arms on command           Communication Communication Communication: Receptive difficulties;Expressive difficulties (said "yes" times one today with increased time in response to "did that hurt")   Cognition Arousal/Alertness: Lethargic Behavior During Therapy: Flat affect Overall Cognitive Status: Impaired/Different from baseline (per family pt ususally talks more, but also will "shut you out" when she wants to)                                Home Living Family/patient expects to be discharged to:: Private residence Living Arrangements: Children Available Help at Discharge: Family;Available 24 hours/day Type of Home:  Apartment Home Access: Level entry     Home Layout: One level     Bathroom Shower/Tub: Teacher, early years/pre: Standard     Home Equipment: Environmental consultant - 2 wheels;Wheelchair - manual;Shower seat;Hospital bed;Bedside commode          Prior Functioning/Environment Level of Independence: Needs assistance  Gait / Transfers Assistance Needed: amb with RW short distance per chart, needed assist for transfers ADL's / Homemaking Assistance Needed: assisted by sister for bathing, dressing, and feeding, family performs meal prep and housekeeping   Comments: Per grand-daughter they A pt with sit to stand and stand turn transfers    OT Diagnosis: Generalized weakness;Cognitive deficits;Disturbance of vision;Acute pain;Hemiplegia non-dominant side;Hemiplegia dominant side   OT Problem List: Decreased strength;Decreased range of motion;Decreased activity tolerance;Impaired balance (sitting and/or standing);Pain;Decreased cognition;Impaired tone;Decreased knowledge of use of DME or AE;Impaired vision/perception      OT Goals(Current goals can be found in the care plan section) Acute Rehab OT Goals Patient Stated Goal: unable to state  OT Frequency:                End of Session Nurse Communication:  (nursing A me with getting her up to recliner--recommended maxi move for back to bed and only to let pt stay in that position no more than an hour)  Activity Tolerance: Patient limited by fatigue;Patient limited by lethargy Patient left: in chair;with call bell/phone within reach   Time: 1228-1249 OT Time Calculation (min): 21 min Charges:  OT General Charges $OT Visit: 1 Procedure OT Evaluation $Initial OT Evaluation Tier I: 1 Procedure OT Treatments $Therapeutic Activity: 8-22 mins  Almon Register [phone] 01/21/2014, 4:16 PM

## 2014-01-21 NOTE — Progress Notes (Signed)
Spoke to [place], Therapist, sports in HD. Says pt's HR is sustaining 130s-150s, treatment was stopped and pt will be returning to the floor. Paged Dr Sherral Hammers to make him aware. Ordered to obtain EKG when pt returns to floor. Spoke to [place], Rn. Says she will bring pt up. Waiting for pt to return at this time.

## 2014-01-21 NOTE — Progress Notes (Signed)
Full note to follow:  I met today and had a thorough discussion with her daughter and granddaughter, [name] and [place], at bedside. They did not have a good understanding about Ms. Heiland's overall poor prognosis and how dire her situation is. I feel that they have a better understanding now as they were tearful throughout our conversation and are considering DNR status. They were receptive to our conversation. They remain hopeful for improved mental status and are not prepared for focus on comfort and wish for [place] to continue full aggressive care at this point. I encouraged them to consider what they are asking Ms. Fetty to go through and consider the realistic expectations that will be gained through these interventions. I will continue to work and speak with Ms. Savant's family.   Vinie Sill, NP Palliative Medicine Team Pager # [phone] (M-F 8a-5p) Team Phone # [phone] (Nights/Weekends)

## 2014-01-21 NOTE — Progress Notes (Signed)
[facility] Progress Note   Subjective: Much less alert.  Not interactive this afternoon.  Daughter [name] at bedsided.  Updated her on grim prognosis.  They are receptive but still hopeful for a return to previous state.    Filed Vitals:   01/21/14 1659 01/21/14 1730 01/21/14 1830 01/21/14 1845  BP: 165/74 140/66 159/87 132/78  Pulse: 95 91 137 99  Temp:    97.7 F (36.5 C)  TempSrc:    Axillary  Resp:    18  Height:      Weight:    44.1 kg (97 lb 3.6 oz)  SpO2:    100%   Exam: Frail older adult female, not in distress,  Will make eye contact briefly, followed simple commands to grip bilat No verbal response Chest clear bilat RRR no MRG Abd soft, NTND, no ascites No LE edema, R great toe dry gangrene Neuro is nf, gen weak, nonverbal today  HD: TTS [facility]  3hr 37min   46.5kg   F160   350/1.5    2K/2Ca Bath    Profile 2   Heparin none  Aranesp 200 q week, Venofer 50 q week,  Calcitriol 0.25 TIW po       Assessment: 1 AMS - acute on chronic, prob due to infection and progressive history of vascular dementia and acute on chronic CVAs on ASA/Plavix 2 Leukocytosis -  Poss UTI (UCx NGF), enteritis, other, on empiric abx Vanc and Cefipime and acyclovir  3 ESRD on HD THS; HD later today 4 HTN/volume - below dry wt, holding home norvasc/MTP 5 Diarrhea - Cdif pend 6 DM w low BS 7 Anemia cont ESA , hold Fe for now 8 HPTH  - Ca high on admit, holding vit D, phoslo,  9 Gangrene R foot - not revasc candidate, amputate when needed 10 Debility - requires 24 hr care at home. Not good HD candidate. Will consult Pall care team to get involved.  Unless dramatic improvement can not return to center based HD, would req LTACH  Plan - No new issues.  Next HD today. Goals of care with palliative medicine.     Recent Labs Lab 01/19/14 0145 01/20/14 0340 01/21/14 0353  NA 143 141 141  K 4.0 4.6 4.4  CL 98 98 97  CO2 28 22 23   GLUCOSE 44* 25* 70  BUN 23 37* 47*   CREATININE 4.39* 6.01* 7.33*  CALCIUM 9.4 9.2 9.3    Recent Labs Lab 12/27/2013 1035 01/21/14 0353  AST 35 23  ALT 14 14  ALKPHOS 214* 201*  BILITOT 0.2* <0.2*  PROT 7.8 7.5  ALBUMIN 2.1* 2.0*    Recent Labs Lab 12/27/2013 1035 01/19/14 0145 01/20/14 0340 01/21/14 0353  WBC 31.6* 25.0* 19.7* 14.0*  NEUTROABS 28.4*  --   --   --   HGB 10.7* 10.3* 10.4* 10.3*  HCT 32.7* 31.8* 32.6* 32.4*  MCV 82.6 83.2 83.4 82.7  PLT 447* 465* 460* 484*   .  stroke: mapping our early stages of recovery book   Does not apply Once  . acyclovir  5 mg/kg (Ideal) Intravenous Q24H  . antiseptic oral rinse  7 mL Mouth Rinse q12n4p  . aspirin EC  81 mg Oral Daily  . calcitRIOL  0.25 mcg Oral Q T,Th,Sa-HD  . ceFEPime (MAXIPIME) IV  2 g Intravenous Q T,Th,Sat-1800  . chlorhexidine  15 mL Mouth Rinse BID  . clopidogrel  75 mg Oral Q breakfast  . darbepoetin (ARANESP) injection -  DIALYSIS  200 mcg Intravenous Q Tue-HD  . heparin  5,000 Units Subcutaneous 3 times per day  . simvastatin  20 mg Oral q1800  . sodium chloride  250 mL Intravenous Once  . vancomycin  500 mg Intravenous Q T,Th,Sa-HD   . dextrose 5 % and 0.45% NaCl 30 mL/hr at 01/20/14 2300   acetaminophen, acetaminophen, albuterol, dextrose, guaiFENesin-dextromethorphan, ondansetron (ZOFRAN) IV, ondansetron

## 2014-01-22 LAB — COMPREHENSIVE METABOLIC PANEL
ALT: 17 U/L (ref 0–35)
AST: 29 U/L (ref 0–37)
Albumin: 2 g/dL — ABNORMAL LOW (ref 3.5–5.2)
Alkaline Phosphatase: 204 U/L — ABNORMAL HIGH (ref 39–117)
Anion gap: 20 — ABNORMAL HIGH (ref 5–15)
BILIRUBIN TOTAL: 0.3 mg/dL (ref 0.3–1.2)
BUN: 27 mg/dL — ABNORMAL HIGH (ref 6–23)
CHLORIDE: 96 meq/L (ref 96–112)
CO2: 23 mEq/L (ref 19–32)
Calcium: 9.1 mg/dL (ref 8.4–10.5)
Creatinine, Ser: 4.92 mg/dL — ABNORMAL HIGH (ref 0.50–1.10)
GFR calc Af Amer: 10 mL/min — ABNORMAL LOW (ref >90)
GFR calc non Af Amer: 9 mL/min — ABNORMAL LOW (ref >90)
Glucose, Bld: 116 mg/dL — ABNORMAL HIGH (ref 70–99)
Potassium: 4 mEq/L (ref 3.7–5.3)
Sodium: 139 mEq/L (ref 137–147)
Total Protein: 7.9 g/dL (ref 6.0–8.3)

## 2014-01-22 LAB — CBC WITH DIFFERENTIAL/PLATELET
Basophils Absolute: 0 10*3/uL (ref 0.0–0.1)
Basophils Relative: 0 % (ref 0–1)
Eosinophils Absolute: 0.2 10*3/uL (ref 0.0–0.7)
Eosinophils Relative: 1 % (ref 0–5)
HCT: 33.8 % — ABNORMAL LOW (ref 36.0–46.0)
Hemoglobin: 10.5 g/dL — ABNORMAL LOW (ref 12.0–15.0)
LYMPHS PCT: 14 % (ref 12–46)
Lymphs Abs: 2.4 10*3/uL (ref 0.7–4.0)
MCH: 26.3 pg (ref 26.0–34.0)
MCHC: 31.1 g/dL (ref 30.0–36.0)
MCV: 84.7 fL (ref 78.0–100.0)
MONOS PCT: 11 % (ref 3–12)
Monocytes Absolute: 1.9 10*3/uL — ABNORMAL HIGH (ref 0.1–1.0)
NEUTROS PCT: 74 % (ref 43–77)
Neutro Abs: 12.6 10*3/uL — ABNORMAL HIGH (ref 1.7–7.7)
Platelets: 474 10*3/uL — ABNORMAL HIGH (ref 150–400)
RBC: 3.99 MIL/uL (ref 3.87–5.11)
RDW: 16.6 % — AB (ref 11.5–15.5)
WBC: 17.1 10*3/uL — ABNORMAL HIGH (ref 4.0–10.5)

## 2014-01-22 LAB — GLUCOSE, CAPILLARY
GLUCOSE-CAPILLARY: 71 mg/dL (ref 70–99)
Glucose-Capillary: 69 mg/dL — ABNORMAL LOW (ref 70–99)
Glucose-Capillary: 130 mg/dL — ABNORMAL HIGH (ref 70–99)
Glucose-Capillary: 125 mg/dL — ABNORMAL HIGH (ref 70–99)
Glucose-Capillary: 119 mg/dL — ABNORMAL HIGH (ref 70–99)
Glucose-Capillary: 126 mg/dL — ABNORMAL HIGH (ref 70–99)
Glucose-Capillary: 155 mg/dL — ABNORMAL HIGH (ref 70–99)
Glucose-Capillary: 161 mg/dL — ABNORMAL HIGH (ref 70–99)
Glucose-Capillary: 110 mg/dL — ABNORMAL HIGH (ref 70–99)
Glucose-Capillary: 130 mg/dL — ABNORMAL HIGH (ref 70–99)

## 2014-01-22 LAB — LIPID PANEL
CHOLESTEROL: 100 mg/dL (ref 0–200)
HDL: 25 mg/dL — ABNORMAL LOW (ref >39)
LDL Cholesterol: 50 mg/dL (ref 0–99)
TRIGLYCERIDES: 126 mg/dL (ref <150)
Total CHOL/HDL Ratio: 4 RATIO
VLDL: 25 mg/dL (ref 0–40)

## 2014-01-22 LAB — MAGNESIUM: MAGNESIUM: 2 mg/dL (ref 1.5–2.5)

## 2014-01-22 LAB — HEMOGLOBIN A1C
Hgb A1c MFr Bld: 4.5 % (ref <5.7)
MEAN PLASMA GLUCOSE: 82 mg/dL (ref <117)

## 2014-01-22 LAB — TROPONIN I
Troponin I: <0.3 ng/mL (ref <0.30)
Troponin I: <0.3 ng/mL (ref <0.30)

## 2014-01-22 LAB — CLOSTRIDIUM DIFFICILE BY PCR: Toxigenic C. Difficile by PCR: POSITIVE — AB

## 2014-01-22 MED ORDER — DEXTROSE 5 % IV SOLN
5.0000 mg/kg | INTRAVENOUS | Status: DC
  Filled 2014-01-22: qty 4.4

## 2014-01-22 MED ORDER — MORPHINE SULFATE 2 MG/ML IJ SOLN
1.0000 mg | INTRAMUSCULAR | Status: DC | PRN
  Administered 2014-01-22 (×3): 1 mg via INTRAVENOUS
  Filled 2014-01-22 (×3): qty 1

## 2014-01-22 MED ORDER — MORPHINE SULFATE 2 MG/ML IJ SOLN
1.0000 mg | INTRAMUSCULAR | Status: DC | PRN
  Administered 2014-01-22 – 2014-01-23 (×10): 2 mg via INTRAVENOUS
  Filled 2014-01-22 (×11): qty 1

## 2014-01-22 MED ORDER — METOPROLOL TARTRATE 1 MG/ML IV SOLN
5.0000 mg | Freq: Four times a day (QID) | INTRAVENOUS | Status: DC
  Administered 2014-01-22 – 2014-01-23 (×4): 5 mg via INTRAVENOUS
  Filled 2014-01-22 (×7): qty 5

## 2014-01-22 MED ORDER — DEXTROSE 50 % IV SOLN
INTRAVENOUS | Status: AC
  Filled 2014-01-22: qty 50

## 2014-01-22 MED ORDER — VANCOMYCIN 50 MG/ML ORAL SOLUTION
125.0000 mg | Freq: Four times a day (QID) | ORAL | Status: DC
  Filled 2014-01-22 (×4): qty 2.5

## 2014-01-22 MED ORDER — LORAZEPAM 2 MG/ML IJ SOLN
0.2500 mg | Freq: Once | INTRAMUSCULAR | Status: AC
  Administered 2014-01-22: 0.25 mg via INTRAVENOUS
  Filled 2014-01-22: qty 1

## 2014-01-22 MED ORDER — DEXTROSE 50 % IV SOLN
25.0000 mL | Freq: Once | INTRAVENOUS | Status: AC | PRN
  Administered 2014-01-22: 25 mL via INTRAVENOUS

## 2014-01-22 MED ORDER — ASPIRIN 300 MG RE SUPP
300.0000 mg | Freq: Every day | RECTAL | Status: DC
Start: 2014-01-23 — End: 2014-01-23
  Filled 2014-01-22: qty 1

## 2014-01-22 MED ORDER — MORPHINE SULFATE 2 MG/ML IJ SOLN
0.5000 mg | INTRAMUSCULAR | Status: DC | PRN
  Administered 2014-01-22: 0.5 mg via INTRAVENOUS
  Filled 2014-01-22: qty 1

## 2014-01-22 MED ORDER — METRONIDAZOLE IN NACL 5-0.79 MG/ML-% IV SOLN
500.0000 mg | Freq: Three times a day (TID) | INTRAVENOUS | Status: DC
  Administered 2014-01-22 – 2014-01-23 (×4): 500 mg via INTRAVENOUS
  Filled 2014-01-22 (×6): qty 100

## 2014-01-22 MED ORDER — METOPROLOL TARTRATE 1 MG/ML IV SOLN
2.5000 mg | Freq: Once | INTRAVENOUS | Status: AC
  Administered 2014-01-22: 2.5 mg via INTRAVENOUS
  Filled 2014-01-22: qty 5

## 2014-01-22 MED ORDER — LORAZEPAM 2 MG/ML IJ SOLN
1.0000 mg | INTRAMUSCULAR | Status: DC | PRN
  Administered 2014-01-22: 1 mg via INTRAVENOUS
  Filled 2014-01-22: qty 1

## 2014-01-22 MED ORDER — DEXTROSE 10 % IV SOLN
INTRAVENOUS | Status: DC
Start: 2014-01-22 — End: 2014-01-23
  Administered 2014-01-22 – 2014-01-23 (×2): via INTRAVENOUS

## 2014-01-22 NOTE — Progress Notes (Signed)
NUTRITION FOLLOW UP  DOCUMENTATION CODES  Per approved criteria   -Severe malnutrition in the context of chronic illness    Pt meets criteria for severe MALNUTRITION in the context of chronic illness as evidenced by 23% weight loss in 3 months and severe muscle wasting.   Intervention:    If TF planned, recommend initiate TF via NGT with Nepro at 25 ml/h, increase by 10 ml every 4 hours to goal rate of 45 ml/h to provide 1944 kcals, 87 gm protein, 785 ml free water daily.  Nutrition Dx:   Inadequate oral intake related to inability to eat as evidenced by NPO, ongoing.  Goal:   Intake to meet >90% of estimated nutrition needs. Unmet.  Monitor:   TF initiation/tolerance/adequacy, weight trend, labs, overall plan of care.  Assessment:   63 y.o. female with a Past Medical History of ESRD on dialysis (TTS), recent non-STEMI with PCI, hypertension, mild dementia who presents today with fever and altered mental status. Please note, patient not able to participate in history taking process because of altered mental status.  Noted grim prognosis. Palliative Care team following. Per discussion with RN, NGT is not in place at this time. Awaiting final decision by family to determine if TF warranted.  Height: Ht Readings from Last 1 Encounters:  01/20/2014 5\' 1"  (1.549 m)    Weight Status:   Wt Readings from Last 1 Encounters:  01/21/14 97 lb 3.6 oz (44.1 kg)  01/19/14  95 lb 14.4 oz (43.5 kg)   Re-estimated needs:  Kcal: 1900-2100 Protein: 70-85 gm Fluid: 1.2 L  Skin: stage 2 on coccyx; necrotic right great toe diabetic ulcer  Diet Order: NPO   Intake/Output Summary (Last 24 hours) at 01/22/14 0912 Last data filed at 01/22/14 0800  Gross per 24 hour  Intake    570 ml  Output    -42 ml  Net    612 ml    Last BM: 10/28   Labs:   Recent Labs Lab 01/20/14 0340 01/21/14 0353 01/22/14 0535  NA 141 141 139  K 4.6 4.4 4.0  CL 98 97 96  CO2 22 23 23   BUN 37* 47* 27*   CREATININE 6.01* 7.33* 4.92*  CALCIUM 9.2 9.3 9.1  MG  --   --  2.0  GLUCOSE 25* 70 116*    CBG (last 3)   Recent Labs  01/22/14 0351 01/22/14 0446 01/22/14 0816  GLUCAP 69* 130* 125*    Scheduled Meds: .  stroke: mapping our early stages of recovery book   Does not apply Once  . acyclovir  5 mg/kg (Ideal) Intravenous Q24H  . antiseptic oral rinse  7 mL Mouth Rinse q12n4p  . aspirin EC  81 mg Oral Daily  . calcitRIOL  0.25 mcg Oral Q T,Th,Sa-HD  . chlorhexidine  15 mL Mouth Rinse BID  . clopidogrel  75 mg Oral Q breakfast  . darbepoetin (ARANESP) injection - DIALYSIS  200 mcg Intravenous Q Tue-HD  . heparin  5,000 Units Subcutaneous 3 times per day  . metronidazole  500 mg Intravenous Q8H  . simvastatin  20 mg Oral q1800  . sodium chloride  250 mL Intravenous Once    Continuous Infusions: . dextrose 50 mL/hr at 01/22/14 0700    Molli Barrows, RD, LDN, [place] Pager [phone] After Hours Pager [phone]

## 2014-01-22 NOTE — Progress Notes (Signed)
I spoke briefly with [name] over the phone who confirms DNR. We discussed that her mother is not strong enough to fight everything she has going on or continue with dialysis. Geneva understands and says that she wants her to be comfortable - so pain/anxiety medication should be given as needed. [name] says she will be here later today so we can discuss further. I will need to address full comfort options with them. I would not recommend placing NGT at this time until I can discuss further with family later today.   Vinie Sill, NP Palliative Medicine Team Pager # [phone] (M-F 8a-5p) Team Phone # [phone] (Nights/Weekends)

## 2014-01-22 NOTE — Progress Notes (Signed)
SLP Cancellation Note  Patient Details Name: [name] MRN: [id number] DOB: 03/30/1950   Cancelled treatment:       Reason Eval/Treat Not Completed:  (SLP to sign off after SLP discussion with NP, Elmo Putt.  Pt remains lethargic, please reorder SLP if indicated.  )   Luanna Salk, [facility] SLP [phone]

## 2014-01-22 NOTE — Progress Notes (Signed)
I have spoken to Ms. Urey's granddaughter and grandson at bedside but [name] will be here in "the next few hours" - unfortunately I will not be. They have agreed to meet me tomorrow 10/29 3 pm to further discuss comfort details. They all confirm comfort is the priority and medication should be liberalized as much as she needs to achieve comfort. From my brief conversation with family today my recommendations are to continue current treatments for now but I would not escalate care. They understand that with no dialysis, poor nutrition, and +C. diff prognosis is very poor and she is not strong enough to overcome these obstacles.   Vinie Sill, NP Palliative Medicine Team Pager # [phone] (M-F 8a-5p) Team Phone # [phone] (Nights/Weekends)

## 2014-01-22 NOTE — Progress Notes (Addendum)
Shift event summary: RN paged NP earlier secondary to pt having ST in 140-150s. 12 lead EKG showed ST depression and fast rate. Troponin obtained and was < .30. Later, HR cont to be elevated, so Metoprolol 2.5mg  IV given with orders to r/p EKG after rate decreased. 2nd 12 lead showed definite ST depression which had also increased on tele as well. Rate lower than previous at 102. This was a definite change compared to EKG 4 days ago. Per RN, pt denied chest pain and was in no distress. Orders to continue to cycle enzymes.   In review of past history, discovered pt had cardiac cath last month after NSTEMI with noted blockages. Pt is on medical mngt for CAD including Plavix, ASA, and statin.   In review of chart, family had spoken to palliative care today and decided not to change code status and continue aggressive measures.   Given new findings of EKG changes, this NP spoke to family. Discussed pt's new acute issues and multiple chronic issues as follows: NSTEMI last month, probable new ischemia/NSTEMI now and not a candidate for further invasive treatment for CAD, ESRD on HD (and not a good candidate for that now either). After discussion, family decided to make the pt a DNR. [name], daughter, stated her mother had previously stated she wouldn't want to be on life support and they didn't want to cause her anymore pain. I thoroughly discussed CPR and what is involved prior to them deciding. Questions asked and answered.   I broached the subject of moving toward comfort care and they will discuss this more with palliative today.  Clance Boll, NP [facility] Update: Pt having hypoglycemic episodes. Change IVF to D10 at 50cc/hr and follow sugars. RN to call if sugars climb to 150. Morphine ordered for agitation, pain with permission from family.

## 2014-01-22 NOTE — Progress Notes (Signed)
SLP Cancellation Note  Patient Details Name: [name] MRN: [id number] DOB: 18-Jun-1950   Cancelled treatment:       Reason Eval/Treat Not Completed:  (note events of last evening and plan for follow up today with palliative group.  will continue to follow to assist with care plan as needed. )  Luanna Salk, [facility] SLP [phone]

## 2014-01-22 NOTE — Progress Notes (Signed)
CRITICAL VALUE ALERT  Critical value received:  Stool tested positive for C- Diff  Date of notification:  01/22/2014  Time of notification:  0820  Critical value read back: Yes   Nurse who received alert:  Allegra Lai, RN   MD notified (1st page):  Dr. Thereasa Solo  Time of first page:  0825  MD notified (2nd page):  Time of second page:  Responding MD:  Erin Hearing, NP  Time MD responded:  [phone]

## 2014-01-22 NOTE — Progress Notes (Signed)
Pt. Just came back from hemodialysis due to sustained tachycardia HR 130's-150's per report. Pt. Is awake and at times restless with moaning. 12 lead EKG was done as ordered. KCharlton Amor was notified of the 12 lead ekg result.  Will continue to monitor pt. At present HR was 110's-120's.

## 2014-01-22 NOTE — Progress Notes (Signed)
[facility]  Patient Status: Active (receiving services up to time of hospitalization)  [facility] is providing the following services: RN, PT and OT  If patient discharges after hours, please call [phone].   [name] 01/22/2014, 10:29 AM

## 2014-01-22 NOTE — Progress Notes (Signed)
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 Phenergan [Promethazine Hcl]     unknown    Patient Measurements: Height: 5\' 1"  (154.9 cm) Weight: 97 lb 3.6 oz (44.1 kg) IBW/kg (Calculated) : 47.8  Vital Signs: Temp: 98.8 F (37.1 C) (10/28 0800) Temp Source: Axillary (10/28 0800) BP: 150/57 mmHg (10/28 0617) Pulse Rate: 115 (10/28 0800) Intake/Output from previous day: 10/27 0701 - 10/28 0700 In: 580 [I.V.:580] Out: -42 [Urine:1; Stool:2] Intake/Output from this shift: Total I/O In: 218.3 [I.V.:118.3; IV Piggyback:100] Out: -   Labs:  Recent Labs  01/20/14 0340 01/21/14 0353 01/22/14 0535  WBC 19.7* 14.0* 17.1*  HGB 10.4* 10.3* 10.5*  PLT 460* 484* 474*  CREATININE 6.01* 7.33* 4.92*   Estimated Creatinine Clearance: 8.3 ml/min (by C-G formula based on Cr of 4.92). No results found for this basename: VANCOTROUGH, VANCOPEAK, VANCORANDOM, [place], [place], [place], [place], [place], [place], AMIKACINPEAK, AMIKACINTROU, AMIKACIN,  in the last 72 hours   Microbiology: Recent Results (from the past 720 hour(s))  CULTURE, BLOOD (ROUTINE X 2)     Status: None   Collection Time    01/22/2014 12:15 PM      Result Value Ref Range Status   Specimen Description BLOOD RIGHT HAND   Final   Special Requests BOTTLES DRAWN AEROBIC ONLY [place]   Final   Culture  Setup Time     Final   Value: 01/07/2014 18:13     Performed at [facility]   Culture     Final   Value:        BLOOD CULTURE RECEIVED NO GROWTH TO DATE CULTURE WILL BE HELD FOR 5 DAYS BEFORE ISSUING A FINAL NEGATIVE REPORT     Performed at [facility]   Report Status PENDING   Incomplete  CULTURE, BLOOD (ROUTINE X 2)     Status: None   Collection Time    01/06/2014 12:22 PM      Result Value Ref Range Status   Specimen Description BLOOD BLOOD RIGHT FOREARM   Final   Special Requests  BOTTLES DRAWN AEROBIC AND ANAEROBIC 5CC EACH   Final   Culture  Setup Time     Final   Value: 01/16/2014 18:14     Performed at [facility]   Culture     Final   Value:        BLOOD CULTURE RECEIVED NO GROWTH TO DATE CULTURE WILL BE HELD FOR 5 DAYS BEFORE ISSUING A FINAL NEGATIVE REPORT     Performed at [facility]   Report Status PENDING   Incomplete  URINE CULTURE     Status: None   Collection Time    12/27/2013  1:33 PM      Result Value Ref Range Status   Specimen Description URINE, CLEAN CATCH   Final   Special Requests NONE   Final   Culture  Setup Time     Final   Value: 12/31/2013 14:20     Performed at [place]     Final   Value: NO GROWTH     Performed at [facility]   Culture     Final   Value: NO GROWTH     Performed at [facility]   Report Status 01/19/2014 FINAL   Final  MRSA PCR SCREENING     Status: None   Collection Time  01/19/2014 10:51 PM      Result Value Ref Range Status   MRSA by PCR NEGATIVE  NEGATIVE Final   Comment:            The GeneXpert MRSA Assay (FDA     approved for NASAL specimens     only), is one component of a     comprehensive MRSA colonization     surveillance program. It is not     intended to diagnose MRSA     infection nor to guide or     monitor treatment for     MRSA infections.  CLOSTRIDIUM DIFFICILE BY PCR     Status: Abnormal   Collection Time    01/21/14  8:04 PM      Result Value Ref Range Status   C difficile by pcr POSITIVE (*) NEGATIVE Final   Comment: CRITICAL RESULT CALLED TO, READ BACK BY AND VERIFIED WITH:     CELENIA SOSA,RN AT 0820 01/22/14 BY K BARR    Anti-infectives   Start     Dose/Rate Route Frequency Ordered Stop   01/22/14 1200  vancomycin (VANCOCIN) 50 mg/mL oral solution 125 mg     125 mg Per Tube 4 times per day 01/22/14 0923     01/22/14 1000  metroNIDAZOLE (FLAGYL) IVPB 500 mg     500 mg 100 mL/hr over 60 Minutes Intravenous Every 8  hours 01/22/14 0830 02/05/14 0959   01/21/14 1800  ceFEPIme (MAXIPIME) 2 g in dextrose 5 % 50 mL IVPB  Status:  Discontinued     2 g 100 mL/hr over 30 Minutes Intravenous Every T-Th-Sa (1800) 01/19/14 0826 01/22/14 0834   01/21/14 1200  vancomycin (VANCOCIN) 500 mg in sodium chloride 0.9 % 100 mL IVPB  Status:  Discontinued     500 mg 100 mL/hr over 60 Minutes Intravenous Every T-Th-Sa (Hemodialysis) 01/19/14 0826 01/22/14 0857   01/19/14 1800  acyclovir (ZOVIRAX) 240 mg in dextrose 5 % 100 mL IVPB     5 mg/kg  47.8 kg (Ideal) 104.8 mL/hr over 60 Minutes Intravenous Every 24 hours 12/26/2013 1401     01/19/14 1300  vancomycin (VANCOCIN) IVPB 1000 mg/200 mL premix     1,000 mg 200 mL/hr over 60 Minutes Intravenous  Once 01/19/14 0826 01/19/14 1437   01/19/14 1200  ceFEPIme (MAXIPIME) 2 g in dextrose 5 % 50 mL IVPB     2 g 100 mL/hr over 30 Minutes Intravenous  Once 01/19/14 0826 01/19/14 1312   12/28/2013 1430  acyclovir (ZOVIRAX) 240 mg in dextrose 5 % 100 mL IVPB     240 mg 104.8 mL/hr over 60 Minutes Intravenous  Once 01/19/2014 1401 12/27/2013 1842   01/11/2014 1245  vancomycin (VANCOCIN) IVPB 1000 mg/200 mL premix     1,000 mg 200 mL/hr over 60 Minutes Intravenous  Once 01/21/2014 1234 01/22/2014 1525   01/22/2014 1245  ceFEPIme (MAXIPIME) 2 g in dextrose 5 % 50 mL IVPB     2 g 100 mL/hr over 30 Minutes Intravenous  Once 12/28/2013 1234 01/19/2014 1422   01/02/2014 1215  vancomycin (VANCOCIN) IVPB 750 mg/150 ml premix  Status:  Discontinued     750 mg 150 mL/hr over 60 Minutes Intravenous  Once 01/02/2014 1202 01/19/2014 1234      Assessment: 63 yo F presented to ED with AMS and started on broad spectrum antibiotics for sepsis, r/o meningitis.  Pt has new developed cdiff and Vancomycin and Ceepime have been discontinued.  Continues  on IV acyclovir.  IV Flagyl and PO Vanc (although pt currently NPO) have been ordered for cdiff treatment.  Pt with ESRD and poor prognosis.  Pall care working with family to  determine goals of care.  Goal of Therapy:  Renal dose adjustment  Plan:  Acyclovir 5mg /kg q24h (will adjust dose based on decreasing body weight) Follow up decisions re: goals of care.  [employer], Pharm.D., BCPS Clinical Pharmacist Pager [phone] 01/22/2014 11:12 AM

## 2014-01-22 NOTE — Plan of Care (Addendum)
RN paged- positive for C Diff- on anbx's for "sepsis"- CXR without infiltrate, urine cx no growth and blood cx's NGTD. No indications at this time to continue anbx's so will dc and begin IV Flagyl since pt unable to tolerate POs. 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  Erin Hearing, ANP

## 2014-01-22 NOTE — Progress Notes (Signed)
TEAM 1 - Stepdown/ICU TEAM Progress Note  [name] GXQ:[id number] DOB: 1950/09/24 DOA: 01/07/2014 PCP: Marco Collie, MD  Admit HPI / Brief Narrative: 63 y.o. female with ESRD on dialysis (TTS), recent non-STEMI with PCI, hypertension, and dementia who presented with AMS. Daughter said pt was doing well one week prior to this admission but had become more lethargic and was unable to get out of the bed.  She also reported fevers of 101 F. Daughter did report that pt is minimally ambulatory at baseline.   HPI/Subjective: Pt is grinding her teeth.  She appears to be uncomfortable, and somwhat agitated.  She can not provide any hx.  She does not react to my exam or presence.    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, 3 new punctate infarctions noted on brain MRI with evidence of chronic multiple small lacunar strokes, and dehydration form poor oral intake  - LP would not likely alter any information that would change her clinical course - will d/c order  - stroke orders in place as noted below  - family meeting w/ PC today to consider transition to comfort care only (I fully support this)  Acute nonhemorrhagic strokes  - noted on brain MRI  - stroke orders placed  - hold off on 2 D ECHO and carotid dopplers as they were recently done 05/2013 and as our management would not change given pt's poor overall prognosis given multiple complex medical conditions outlined here  - d/w neurologist Dr. Aram Beecham and he agrees with holding off on 2 D ECHO and carotid dopplers for same reason outline above  - Palliative Care working with family as discussed above    ESRD requiring chronic dialysis  - Nephrology team following - was unable to complete HD yesterday due to tachycardia - is currently NOT a candidate for outpt HD per Nephrology   Diarrhea - C diff colitis - C diff PCR positive - continue empiric  contact precautions and IV flagyl as pt not safe for oral intake   Hypertensive emergency in ED  - BP now reasonably controlled - follow trend   Sepsis due to infectious colitis (C diff) - criteria met given tachycardia, leukocytosis, RR > 22 bpm, hypotension  - sepsis physiology persists - pt is critically ill  Severe malnutrition in the context of chronic illness  - secondary to progressive FTT, dementia, acute illness, strokes acute and chronic  - SLP notes pt not safe for oral intake   DM with hypoglycemia  - pt has suffered recurrent hypoglycemia necessitating initiation of low volume dextrose IVF - follow trend   Anemia of chronic disease, CKD  - Hg remains stable   Gangrene R foot  - not revasc candidate - amputation would be only option    Functional quadriplegia  - requires 24 hr care at home - not an outpt HD candidate at present - would require LTACH if ongoing HD to be considered  - Palliative Care to help establish goals of care    Code Status: DNR Family Communication: no family present at time of exam Disposition Plan: SDU w/ possible transition to [facility] bed soon   Consultants: Nephrology  ID Palliative Care   Procedures: none  Antibiotics: Vancomycin 10/24 > 10/25 Maxipime 10/24 > 10/25 Acyclovir 10/24 > 10/26 Flagyl IV 10/28 >  DVT prophylaxis: SQ heparin   Objective: Blood pressure 150/57, pulse 108, temperature 98.8 F (37.1 C), temperature  source Axillary, resp. rate 17, height 5' 1"  (1.549 m), weight 44.1 kg (97 lb 3.6 oz), SpO2 100.00%.  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)  Data Reviewed: Basic Metabolic Panel:  Recent Labs Lab 01/19/2014 1035 01/19/14 0145 01/20/14 0340 01/21/14 0353 01/22/14 0535  NA 137 143 141 141 139  K 3.7 4.0 4.6 4.4 4.0  CL 89* 98 98 97 96  CO2 20 28 22 23 23   GLUCOSE 171* 44* 25* 70 116*  BUN 70* 23 37* 47* 27*  CREATININE 9.24* 4.39* 6.01* 7.33* 4.92*  CALCIUM 9.1 9.4 9.2 9.3 9.1  MG  --   --   --   --  2.0    Liver Function Tests:  Recent Labs Lab 01/03/2014 1035 01/21/14 0353 01/22/14 0535  AST 35 23 29  ALT 14 14 17   ALKPHOS 214* 201* 204*  BILITOT 0.2* <0.2* 0.3  PROT 7.8 7.5 7.9  ALBUMIN 2.1* 2.0* 2.0*    Recent Labs Lab 01/16/2014 1245  AMMONIA 35   CBC:  Recent Labs Lab 01/22/2014 1035 01/19/14 0145 01/20/14 0340 01/21/14 0353 01/22/14 0535  WBC 31.6* 25.0* 19.7* 14.0* 17.1*  NEUTROABS 28.4*  --   --   --  12.6*  HGB 10.7* 10.3* 10.4* 10.3* 10.5*  HCT 32.7* 31.8* 32.6* 32.4* 33.8*  MCV 82.6 83.2 83.4 82.7 84.7  PLT 447* 465* 460* 484* 474*    Cardiac Enzymes:  Recent Labs Lab 01/19/14 0145 01/19/14 0753 01/21/14 2244 01/22/14 0535 01/22/14 1055  TROPONINI <0.30 <0.30 <0.30 <0.30 <0.30   CBG:  Recent Labs Lab 01/22/14 0351 01/22/14 0446 01/22/14 0816 01/22/14 1026 01/22/14 1159  GLUCAP 69* 130* 125* 119* 126*    Recent Results (from the past 240 hour(s))  CULTURE, BLOOD (ROUTINE X 2)     Status: None   Collection Time    01/17/2014 12:15 PM      Result Value Ref Range Status   Specimen Description BLOOD RIGHT HAND   Final   Special Requests BOTTLES DRAWN AEROBIC ONLY 1CC   Final   Culture  Setup Time     Final   Value: 01/20/2014 18:13     Performed at [facility]   Culture     Final   Value:        BLOOD CULTURE RECEIVED NO GROWTH TO DATE CULTURE WILL BE HELD FOR 5 DAYS  BEFORE ISSUING A FINAL NEGATIVE REPORT     Performed at [facility]   Report Status PENDING   Incomplete  CULTURE, BLOOD (ROUTINE X 2)     Status: None   Collection Time    01/07/2014 12:22 PM      Result Value Ref Range Status   Specimen Description BLOOD BLOOD RIGHT FOREARM   Final   Special Requests BOTTLES DRAWN AEROBIC AND ANAEROBIC [facility] EACH   Final   Culture  Setup Time     Final   Value: 01/08/2014 18:14  Performed at [facility]     Final   Value:        BLOOD CULTURE RECEIVED NO GROWTH TO DATE CULTURE WILL BE HELD FOR 5 DAYS BEFORE ISSUING A FINAL NEGATIVE REPORT     Performed at [facility]   Report Status PENDING   Incomplete  URINE CULTURE     Status: None   Collection Time    01/17/2014  1:33 PM      Result Value Ref Range Status   Specimen Description URINE, CLEAN CATCH   Final   Special Requests NONE   Final   Culture  Setup Time     Final   Value: 01/04/2014 14:20     Performed at [facility]     Final   Value: NO GROWTH     Performed at [facility]   Culture     Final   Value: NO GROWTH     Performed at [facility]   Report Status 01/19/2014 FINAL   Final  MRSA PCR SCREENING     Status: None   Collection Time    01/10/2014 10:51 PM      Result Value Ref Range Status   MRSA by PCR NEGATIVE  NEGATIVE Final   Comment:            The GeneXpert MRSA Assay (FDA     approved for NASAL specimens     only), is one component of a     comprehensive MRSA colonization     surveillance program. It is not     intended to diagnose MRSA     infection nor to guide or     monitor treatment for     MRSA infections.  CLOSTRIDIUM DIFFICILE BY PCR     Status: Abnormal   Collection Time    01/21/14  8:04 PM      Result Value Ref Range Status   C difficile by pcr POSITIVE (*) NEGATIVE Final   Comment: CRITICAL RESULT CALLED TO, READ BACK BY AND VERIFIED WITH:     CELENIA SOSA,RN AT 0820 01/22/14  BY K BARR     Studies:  Recent x-ray studies have been reviewed in detail by the Attending Physician  Scheduled Meds:  Scheduled Meds: .  stroke: mapping our early stages of recovery book   Does not apply Once  . antiseptic oral rinse  7 mL Mouth Rinse q12n4p  . aspirin EC  81 mg Oral Daily  . calcitRIOL  0.25 mcg Oral Q T,Th,Sa-HD  . chlorhexidine  15 mL Mouth Rinse BID  . clopidogrel  75 mg Oral Q breakfast  . darbepoetin (ARANESP) injection - DIALYSIS  200 mcg Intravenous Q Tue-HD  . heparin  5,000 Units Subcutaneous 3 times per day  . metronidazole  500 mg Intravenous Q8H  . simvastatin  20 mg Oral q1800  . sodium chloride  250 mL Intravenous Once  . vancomycin  125 mg Per Tube 4 times per day    Time spent on care of this patient: 35 mins   Mindee Robledo T , MD   [facility] Office  [phone] Pager - Text Page per Shea Evans as per below:  On-Call/Text Page:      [email or web address]      password [email or web address]  If 7PM-7AM, please contact night-coverage [email or web address] Password [email or web address] 01/22/2014, 1:20 PM   LOS: 4 days

## 2014-01-22 NOTE — Plan of Care (Signed)
Problem: Phase I Progression Outcomes Goal: Voiding-avoid urinary catheter unless indicated Outcome: Not Applicable Date Met:  49/96/92 Pt oliguric d/t ESRD. Not using urinary catheter.

## 2014-01-22 NOTE — Progress Notes (Signed)
Pt. is more awake and restless and agitated this morning HR 120's-130's, moaning. With hypoglycemic episode blood sugar was 69. D50-52ml. Slow IVP was given and recheck after 15 mins blood sugar up to 139.Marland Kitchen Rema Jasmine was notified with orders made. Morphine 1mg . IV given for pain and x1 dose ativan 0.25mg . Diluted with NS Iv was given. Pt. Is calm at present  Will continue to monitor

## 2014-01-22 NOTE — Progress Notes (Signed)
[facility] Progress Note   Subjective: Developed tachycardia during HD, couldn't complete HD Made DNR o/n Palliative care workign with family + C Diff on IV Flagyl Pt alert to name only this AM   Filed Vitals:   01/22/14 0354 01/22/14 0617 01/22/14 0630 01/22/14 0800  BP: 173/55 150/57    Pulse: 115 112 109 115  Temp: 98 F (36.7 C)   98.8 F (37.1 C)  TempSrc: Axillary   Axillary  Resp: 17 15 15 21   Height:      Weight:      SpO2: 97% 100% 100% 100%   Exam: Frail older adult female, not in distress,  Will make eye contact briefly, followed simple commands to grip bilat No verbal response Chest clear bilat RRR no MRG Abd soft, NTND, no ascites No LE edema, R great toe dry gangrene Neuro is nf, gen weak, nonverbal today  HD: TTS [facility]  3hr 81min   46.5kg   F160   350/1.5    2K/2Ca [place]    Profile 2   Heparin none  Aranesp 200 q week, Venofer 50 q week,  Calcitriol 0.25 TIW po       Assessment: 1 AMS - acute on chronic, prob due to infection and progressive history of vascular dementia and acute on chronic CVAs on ASA/Plavix 2 Leukocytosis -  Poss UTI (UCx NGF), enteritis, other, now only on acyclovir for ? Herpetic CNS disease.   3 ESRD on HD THS; intolerant of HD 10/27.  Palliative meetign scheduled today 4 HTN/volume - below dry wt, holding home norvasc/MTP 5 Diarrhea - Cdif positive 10/27: on IV flagyl 6 DM w low BS 7 Anemia cont ESA , hold Fe for now 8 HPTH  - Ca high on admit, holding vit D, phoslo,  9 Gangrene R foot - not revasc candidate, amputate when needed 10 Debility - requires 24 hr care at home. Not good HD candidate. Will consult Pall care team to get involved.  Unless dramatic improvement can not return to center based HD, would req LTACH 11. DNR/DNI  Plan - NO HD today. Family meeting.      Recent Labs Lab 01/20/14 0340 01/21/14 0353 01/22/14 0535  NA 141 141 139  K 4.6 4.4 4.0  CL 98 97 96  CO2 22 23 23   GLUCOSE 25* 70  116*  BUN 37* 47* 27*  CREATININE 6.01* 7.33* 4.92*  CALCIUM 9.2 9.3 9.1    Recent Labs Lab 01/07/2014 1035 01/21/14 0353 01/22/14 0535  AST 35 23 29  ALT 14 14 17   ALKPHOS 214* 201* 204*  BILITOT 0.2* <0.2* 0.3  PROT 7.8 7.5 7.9  ALBUMIN 2.1* 2.0* 2.0*    Recent Labs Lab 01/13/2014 1035  01/20/14 0340 01/21/14 0353 01/22/14 0535  WBC 31.6*  < > 19.7* 14.0* 17.1*  NEUTROABS 28.4*  --   --   --  12.6*  HGB 10.7*  < > 10.4* 10.3* 10.5*  HCT 32.7*  < > 32.6* 32.4* 33.8*  MCV 82.6  < > 83.4 82.7 84.7  PLT 447*  < > 460* 484* 474*  < > = values in this interval not displayed. .  stroke: mapping our early stages of recovery book   Does not apply Once  . acyclovir  5 mg/kg (Ideal) Intravenous Q24H  . antiseptic oral rinse  7 mL Mouth Rinse q12n4p  . aspirin EC  81 mg Oral Daily  . calcitRIOL  0.25 mcg Oral Q T,Th,Sa-HD  .  chlorhexidine  15 mL Mouth Rinse BID  . clopidogrel  75 mg Oral Q breakfast  . darbepoetin (ARANESP) injection - DIALYSIS  200 mcg Intravenous Q Tue-HD  . heparin  5,000 Units Subcutaneous 3 times per day  . metronidazole  500 mg Intravenous Q8H  . simvastatin  20 mg Oral q1800  . sodium chloride  250 mL Intravenous Once  . vancomycin  125 mg Per Tube 4 times per day   . dextrose 50 mL/hr at 01/22/14 0700   acetaminophen, acetaminophen, albuterol, guaiFENesin-dextromethorphan, morphine injection, ondansetron (ZOFRAN) IV, ondansetron

## 2014-01-22 NOTE — Progress Notes (Signed)
    [facility] for Infectious Disease    Date of Admission:  01/05/2014   Total days of antibiotics 4        Day 1 metronidazole           ID: [name] is a 63 y.o. female with ESRD, vascular dementia, admitted   Active Problems:   CKD (chronic kidney disease) stage V requiring chronic dialysis   Vascular dementia   Hypertensive emergency   NSTEMI (non-ST elevated myocardial infarction)   Malnutrition of moderate degree   Encephalopathy acute    Subjective: Unable to tolerate HD yesterday. Still encephalopathic. Palliative care working with family. Patient is DNR. cdifficile returned as positive, started on Iv metronidazole yesterday  Medications:  .  stroke: mapping our early stages of recovery book   Does not apply Once  . acyclovir  5 mg/kg Intravenous Q24H  . antiseptic oral rinse  7 mL Mouth Rinse q12n4p  . aspirin EC  81 mg Oral Daily  . calcitRIOL  0.25 mcg Oral Q T,Th,Sa-HD  . chlorhexidine  15 mL Mouth Rinse BID  . clopidogrel  75 mg Oral Q breakfast  . darbepoetin (ARANESP) injection - DIALYSIS  200 mcg Intravenous Q Tue-HD  . heparin  5,000 Units Subcutaneous 3 times per day  . metronidazole  500 mg Intravenous Q8H  . simvastatin  20 mg Oral q1800  . sodium chloride  250 mL Intravenous Once  . vancomycin  125 mg Per Tube 4 times per day    Objective: Vital signs in last 24 hours: Temp:  [97.6 F (36.4 C)-98.8 F (37.1 C)] 98.8 F (37.1 C) (10/28 0800) Pulse Rate:  [88-137] 115 (10/28 0800) Resp:  [14-21] 21 (10/28 0800) BP: (119-173)/(46-87) 150/57 mmHg (10/28 0617) SpO2:  [97 %-100 %] 100 % (10/28 0800) Weight:  [97 lb 3.6 oz (44.1 kg)-98 lb 8.7 oz (44.7 kg)] 97 lb 3.6 oz (44.1 kg) (10/27 1845)  Did not examine  Lab Results  Recent Labs  01/21/14 0353 01/22/14 0535  WBC 14.0* 17.1*  HGB 10.3* 10.5*  HCT 32.4* 33.8*  NA 141 139  K 4.4 4.0  CL 97 96  CO2 23 23  BUN 47* 27*  CREATININE 7.33* 4.92*   Liver Panel  Recent Labs  01/21/14 0353 01/22/14 0535  PROT 7.5 7.9  ALBUMIN 2.0* 2.0*  AST 23 29  ALT 14 17  ALKPHOS 201* 204*  BILITOT <0.2* 0.3   Sedimentation Rate No results found for this basename: ESRSEDRATE,  in the last 72 hours C-Reactive Protein No results found for this basename: CRP,  in the last 72 hours  Microbiology:  Studies/Results: No results found.   Assessment/Plan: cdifficile infection = likely cause of sepsis since no other cause found, although she appeared to have no diarrhea for the first few days of admit, possibly due to ileus. Continue on iv metronidazole but also try to give oral vancomycin if able to take anything by mouth.   -encephalopathy likely due to sepsis and not hsv. Will discontinue acyclovir - appears family is considering comfort care.   [facility] for Infectious Diseases Cell: [phone] Pager: [phone]  01/22/2014, 11:26 AM

## 2014-01-23 DIAGNOSIS — I6330 Cerebral infarction due to thrombosis of unspecified cerebral artery: Secondary | ICD-10-CM | POA: Insufficient documentation

## 2014-01-23 DIAGNOSIS — I639 Cerebral infarction, unspecified: Secondary | ICD-10-CM

## 2014-01-23 DIAGNOSIS — G9340 Encephalopathy, unspecified: Secondary | ICD-10-CM

## 2014-01-23 LAB — CBC
HCT: 28.9 % — ABNORMAL LOW (ref 36.0–46.0)
Hemoglobin: 9.3 g/dL — ABNORMAL LOW (ref 12.0–15.0)
MCH: 26.1 pg (ref 26.0–34.0)
MCHC: 32.2 g/dL (ref 30.0–36.0)
MCV: 81.2 fL (ref 78.0–100.0)
PLATELETS: 406 10*3/uL — AB (ref 150–400)
RBC: 3.56 MIL/uL — ABNORMAL LOW (ref 3.87–5.11)
RDW: 16.3 % — ABNORMAL HIGH (ref 11.5–15.5)
WBC: 11 10*3/uL — ABNORMAL HIGH (ref 4.0–10.5)

## 2014-01-23 LAB — BASIC METABOLIC PANEL
Anion gap: 15 (ref 5–15)
BUN: 33 mg/dL — ABNORMAL HIGH (ref 6–23)
CO2: 25 mEq/L (ref 19–32)
CREATININE: 5.99 mg/dL — AB (ref 0.50–1.10)
Calcium: 8.9 mg/dL (ref 8.4–10.5)
Chloride: 96 mEq/L (ref 96–112)
GFR calc Af Amer: 8 mL/min — ABNORMAL LOW (ref >90)
GFR calc non Af Amer: 7 mL/min — ABNORMAL LOW (ref >90)
GLUCOSE: 113 mg/dL — AB (ref 70–99)
Potassium: 3.5 mEq/L — ABNORMAL LOW (ref 3.7–5.3)
Sodium: 136 mEq/L — ABNORMAL LOW (ref 137–147)

## 2014-01-23 LAB — GLUCOSE, CAPILLARY
GLUCOSE-CAPILLARY: 114 mg/dL — AB (ref 70–99)
Glucose-Capillary: 110 mg/dL — ABNORMAL HIGH (ref 70–99)
Glucose-Capillary: 115 mg/dL — ABNORMAL HIGH (ref 70–99)

## 2014-01-23 MED ORDER — SODIUM CHLORIDE 0.9 % IV SOLN
2.0000 mg/h | INTRAVENOUS | Status: DC
  Administered 2014-01-23: 2 mg/h via INTRAVENOUS
  Administered 2014-01-23: 3 mg/h via INTRAVENOUS
  Filled 2014-01-23 (×2): qty 10

## 2014-01-23 NOTE — Progress Notes (Signed)
Informed by RN, that patient had very large bloody BM, with hypotension. Patient, abdomen mildy but diffusely tender, blood visible in sheets (RN had just cleaned).Spoke with [name] (daughter) first over the phone, and then at bedside. Explained extremely poor prognoses, and that patient was unlikely to survive this hospitalization. Suspect inpatient death in a matter of hours to days, family very understanding. Will start Morphine gtt for comfort, family agreeable. Hold transfer, keep in SDU.

## 2014-01-23 NOTE — Progress Notes (Addendum)
PATIENT DETAILS Name: [name] Age: 63 y.o. Sex: female Date of Birth: 11-28-1950 Admit Date: 01/07/2014 Admitting Physician Evalee Mutton Kristeen Mans, MD OZD:GUYQIH,KVQQ, MD  Brief summary  62 y.o. female with ESRD on dialysis (TTS), recent non-STEMI with PCI, hypertension, and dementia who presented with AMS. Daughter said pt was doing well one week prior to this admission but had become more lethargic and was unable to get out of the bed. She also reported fevers of 101 F. Started on broad spectrum antimicrobial agents including vancomycin/cefepime and acyclovir. Subsequent workup has revealed C. difficile PCR positive, now only maintained on IV Flagyl as patient unable to take any oral medication safety. Continues to be very lethargic/altered, palliative care has been consulted. Family aware of very poor overall prognosis.  Subjective: Barely responding to any commands, appears comfortable however.  Assessment/Plan: Active Problems: Acute encephalopathy on chronic dementia  - multifactorial: acute infection (C. difficile) imposed on vascular dementia, 3 new punctate infarctions noted on brain MRI with evidence of chronic multiple small lacunar strokes, and dehydration form poor oral intake. Very poor overall prognosis, further workup including a lumbar puncture would not change any management at this time. Does have CVA, further stroke workup would also not change management. Would benefit from transition to full comfort measures, avoid further input from palliative care team (See addendum note below).   Severe sepsis -Secondary to C. difficile colitis. Unfortunately, still with significant alteration in her mental status. Sepsis as well as urology persist, remains critically ill. Now on IV Flagyl. Vancomycin/cefepime and acyclovir had been discontinued.  C. difficile colitis -Likely contributing to above.empiric Contact precautions and IV flagyl as pt not safe for oral  intake  Acute nonhemorrhagic strokes  -Not a candidate for further workup, further workup will not change management.Palliative Care working with family as discussed above   ESRD requiring chronic dialysis  - Nephrology team following - was unable to complete last HD due to tachycardia - is currently NOT a candidate for outpt HD per Nephrology.Palliative Care working with family as discussed above   Hypoglycemia  -Seems to have resolved. Likely secondary to sepsis.  History of CAD with recent non-STEMI -On aspirin suppository. Unfortunately with very poor overall prognosis  Severe malnutrition in the context of chronic illness  - secondary to progressive FTT, dementia, acute illness, strokes acute and chronic  - SLP notes pt not safe for oral intake   Anemia of chronic disease, CKD  - Hg remains stable   Gangrene R foot  - not revasc candidate - amputation would be only option   Functional quadriplegia  - requires 24 hr care at home - not an outpt HD candidate at present - would require LTACH if ongoing HD to be considered  - Palliative Care to help establish goals of care  . Chronic Diastolic CHF -compensated, vol management with HD   Addendum:4:15 pm-Spoke with Palliative care team who met with family, goal is comfort, but want to continue with IVF, IV Abx for now, but no further escalation in care. Family realizes that patient unable to tolerate HD further. Will move to a med-surg bed, palliative care will continue to follow.  Disposition: Remain inpatient-in SDU till family establishes goals of care  Antibiotics: Vancomycin 10/24 > 10/25  Maxipime 10/24 > 10/25  Acyclovir 10/24 > 10/26  Flagyl IV 10/28 >  DVT Prophylaxis: Prophylactic Heparin   Code Status: DNR  Family Communication None at bedside this morning-palliative  care planning for family meeting later today  Procedures:  None  CONSULTS:  ID, nephrology and Palliative care  Time spent 40  minutes-which includes 50% of the time with face-to-face with patient/ family and coordinating care related to the above assessment and plan.    MEDICATIONS: Scheduled Meds: .  stroke: mapping our early stages of recovery book   Does not apply Once  . aspirin  300 mg Rectal Daily  . chlorhexidine  15 mL Mouth Rinse BID  . darbepoetin (ARANESP) injection - DIALYSIS  200 mcg Intravenous Q Tue-HD  . heparin  5,000 Units Subcutaneous 3 times per day  . metoprolol  5 mg Intravenous 4 times per day  . metronidazole  500 mg Intravenous Q8H   Continuous Infusions: . dextrose 30 mL/hr at 01/23/14 1000   PRN Meds:.acetaminophen, albuterol, LORazepam, morphine injection, ondansetron (ZOFRAN) IV, ondansetron  Antibiotics: Anti-infectives   Start     Dose/Rate Route Frequency Ordered Stop   01/22/14 1800  acyclovir (ZOVIRAX) 220 mg in dextrose 5 % 100 mL IVPB  Status:  Discontinued     5 mg/kg  44.1 kg 104.4 mL/hr over 60 Minutes Intravenous Every 24 hours 01/22/14 1112 01/22/14 1136   01/22/14 1200  vancomycin (VANCOCIN) 50 mg/mL oral solution 125 mg  Status:  Discontinued     125 mg Per Tube 4 times per day 01/22/14 0923 01/22/14 1331   01/22/14 1000  metroNIDAZOLE (FLAGYL) IVPB 500 mg     500 mg 100 mL/hr over 60 Minutes Intravenous Every 8 hours 01/22/14 0830 02/05/14 0959   01/21/14 1800  ceFEPIme (MAXIPIME) 2 g in dextrose 5 % 50 mL IVPB  Status:  Discontinued     2 g 100 mL/hr over 30 Minutes Intravenous Every T-Th-Sa (1800) 01/19/14 0826 01/22/14 0834   01/21/14 1200  vancomycin (VANCOCIN) 500 mg in sodium chloride 0.9 % 100 mL IVPB  Status:  Discontinued     500 mg 100 mL/hr over 60 Minutes Intravenous Every T-Th-Sa (Hemodialysis) 01/19/14 0826 01/22/14 0857   01/19/14 1800  acyclovir (ZOVIRAX) 240 mg in dextrose 5 % 100 mL IVPB  Status:  Discontinued     5 mg/kg  47.8 kg (Ideal) 104.8 mL/hr over 60 Minutes Intravenous Every 24 hours 12/29/2013 1401 01/22/14 1112   01/19/14 1300   vancomycin (VANCOCIN) IVPB 1000 mg/200 mL premix     1,000 mg 200 mL/hr over 60 Minutes Intravenous  Once 01/19/14 0826 01/19/14 1437   01/19/14 1200  ceFEPIme (MAXIPIME) 2 g in dextrose 5 % 50 mL IVPB     2 g 100 mL/hr over 30 Minutes Intravenous  Once 01/19/14 0826 01/19/14 1312   01/14/2014 1430  acyclovir (ZOVIRAX) 240 mg in dextrose 5 % 100 mL IVPB     240 mg 104.8 mL/hr over 60 Minutes Intravenous  Once 12/28/2013 1401 01/17/2014 1842   12/26/2013 1245  vancomycin (VANCOCIN) IVPB 1000 mg/200 mL premix     1,000 mg 200 mL/hr over 60 Minutes Intravenous  Once 01/03/2014 1234 01/06/2014 1525   01/23/2014 1245  ceFEPIme (MAXIPIME) 2 g in dextrose 5 % 50 mL IVPB     2 g 100 mL/hr over 30 Minutes Intravenous  Once 01/01/2014 1234 01/04/2014 1422   01/10/2014 1215  vancomycin (VANCOCIN) IVPB 750 mg/150 ml premix  Status:  Discontinued     750 mg 150 mL/hr over 60 Minutes Intravenous  Once 01/12/2014 1202 01/17/2014 1234       PHYSICAL EXAM: Vital signs in last  24 hours: Filed Vitals:   01/23/14 0000 01/23/14 0355 01/23/14 0700 01/23/14 0749  BP: 142/47 148/61  154/51  Pulse: 91 85  95  Temp:  98.5 F (36.9 C) 99.8 F (37.7 C)   TempSrc:  Axillary Axillary   Resp: _0 Height:      Weight:      SpO2: 100% 100%  99%    Weight change:  Filed Weights   01/19/14 0538 01/21/14 1650 01/21/14 1845  Weight: 43.5 kg (95 lb 14.4 oz) 44.7 kg (98 lb 8.7 oz) 44.1 kg (97 lb 3.6 oz)   Body mass index is 18.38 kg/(m^2).   Gen Exam: lethargic, does not respond to any verbal commands Neck: Supple Chest: B/L Clear.   CVS: S1 S2 Regular, no murmurs.  Abdomen: soft, BS +, non tender, non distended.  Extremities: no edema, lower extremities warm to touch.  Intake/Output from previous day:  Intake/Output Summary (Last 24 hours) at 01/23/14 1119 Last data filed at 01/22/14 1900  Gross per 24 hour  Intake 515.17 ml  Output      1 ml  Net 514.17 ml     LAB RESULTS: CBC  Recent Labs Lab  12/28/2013 1035 01/19/14 0145 01/20/14 0340 01/21/14 0353 01/22/14 0535 01/23/14 0233  WBC 31.6* 25.0* 19.7* 14.0* 17.1* 11.0*  HGB 10.7* 10.3* 10.4* 10.3* 10.5* 9.3*  HCT 32.7* 31.8* 32.6* 32.4* 33.8* 28.9*  PLT 447* 465* 460* 484* 474* 406*  MCV 82.6 83.2 83.4 82.7 84.7 81.2  MCH 27.0 27.0 26.6 26.3 26.3 26.1  MCHC 32.7 32.4 31.9 31.8 31.1 32.2  RDW 16.3* 16.5* 16.9* 16.7* 16.6* 16.3*  LYMPHSABS 1.6  --   --   --  2.4  --   MONOABS 1.6*  --   --   --  1.9*  --   EOSABS 0.0  --   --   --  0.2  --   BASOSABS 0.0  --   --   --  0.0  --     Chemistries   Recent Labs Lab 01/19/14 0145 01/20/14 0340 01/21/14 0353 01/22/14 0535 01/23/14 0233  NA 143 141 141 139 136*  K 4.0 4.6 4.4 4.0 3.5*  CL 98 98 97 96 96  CO2 _1 GLUCOSE 44* 25* 70 116* 113*  BUN 23 37* 47* 27* 33*  CREATININE 4.39* 6.01* 7.33* 4.92* 5.99*  CALCIUM 9.4 9.2 9.3 9.1 8.9  MG  --   --   --  2.0  --     CBG:  Recent Labs Lab 01/22/14 1713 01/22/14 2109 01/22/14 2325 01/23/14 0355 01/23/14 0814  GLUCAP 161* 110* 130* 110* 114*    GFR Estimated Creatinine Clearance: 6.8 ml/min (by C-G formula based on Cr of 5.99).  Coagulation profile No results found for this basename: INR, PROTIME,  in the last 168 hours  Cardiac Enzymes  Recent Labs Lab 01/21/14 2244 01/22/14 0535 01/22/14 1055  TROPONINI <0.30 <0.30 <0.30    No components found with this basename: POCBNP,  No results found for this basename: DDIMER,  in the last 72 hours  Recent Labs  01/22/14 0535  HGBA1C 4.5    Recent Labs  01/22/14 0535  CHOL 100  HDL 25*  LDLCALC 50  TRIG 126  CHOLHDL 4.0   No results found for this basename: TSH, T4TOTAL, FREET3, T3FREE, THYROIDAB,  in the last 72 hours No results found for this basename: VITAMINB12, FOLATE, FERRITIN,  TIBC, IRON, RETICCTPCT,  in the last 72 hours No results found for this basename: LIPASE, AMYLASE,  in the last 72 hours  Urine Studies No results  found for this basename: UACOL, UAPR, USPG, UPH, UTP, UGL, UKET, UBIL, UHGB, UNIT, UROB, ULEU, UEPI, UWBC, URBC, UBAC, CAST, CRYS, UCOM, BILUA,  in the last 72 hours  MICROBIOLOGY: Recent Results (from the past 240 hour(s))  CULTURE, BLOOD (ROUTINE X 2)     Status: None   Collection Time    01/23/2014 12:15 PM      Result Value Ref Range Status   Specimen Description BLOOD RIGHT HAND   Final   Special Requests BOTTLES DRAWN AEROBIC ONLY [place]   Final   Culture  Setup Time     Final   Value: 01/09/2014 18:13     Performed at [facility]   Culture     Final   Value:        BLOOD CULTURE RECEIVED NO GROWTH TO DATE CULTURE WILL BE HELD FOR 5 DAYS BEFORE ISSUING A FINAL NEGATIVE REPORT     Performed at [facility]   Report Status PENDING   Incomplete  CULTURE, BLOOD (ROUTINE X 2)     Status: None   Collection Time    01/05/2014 12:22 PM      Result Value Ref Range Status   Specimen Description BLOOD BLOOD RIGHT FOREARM   Final   Special Requests BOTTLES DRAWN AEROBIC AND ANAEROBIC 5CC EACH   Final   Culture  Setup Time     Final   Value: 01/05/2014 18:14     Performed at [facility]   Culture     Final   Value:        BLOOD CULTURE RECEIVED NO GROWTH TO DATE CULTURE WILL BE HELD FOR 5 DAYS BEFORE ISSUING A FINAL NEGATIVE REPORT     Performed at [facility]   Report Status PENDING   Incomplete  URINE CULTURE     Status: None   Collection Time    01/08/2014  1:33 PM      Result Value Ref Range Status   Specimen Description URINE, CLEAN CATCH   Final   Special Requests NONE   Final   Culture  Setup Time     Final   Value: 01/16/2014 14:20     Performed at [place]     Final   Value: NO GROWTH     Performed at [facility]   Culture     Final   Value: NO GROWTH     Performed at [facility]   Report Status 01/19/2014 FINAL   Final  MRSA PCR SCREENING     Status: None   Collection Time    12/27/2013 10:51 PM       Result Value Ref Range Status   MRSA by PCR NEGATIVE  NEGATIVE Final   Comment:            The GeneXpert MRSA Assay (FDA     approved for NASAL specimens     only), is one component of a     comprehensive MRSA colonization     surveillance program. It is not     intended to diagnose MRSA     infection nor to guide or     monitor treatment for     MRSA infections.  CLOSTRIDIUM DIFFICILE BY PCR     Status: Abnormal  Collection Time    01/21/14  8:04 PM      Result Value Ref Range Status   C difficile by pcr POSITIVE (*) NEGATIVE Final   Comment: CRITICAL RESULT CALLED TO, READ BACK BY AND VERIFIED WITH:     CELENIA SOSA,RN AT 0820 01/22/14 BY K BARR    RADIOLOGY STUDIES/RESULTS: Ct Head Wo Contrast  12/30/2013   CLINICAL DATA:  Altered mental status. Unresponsive patient. History of hypertension, diabetes and congestive heart failure. History of kidney disease.  EXAM: CT HEAD WITHOUT CONTRAST  TECHNIQUE: Contiguous axial images were obtained from the base of the skull through the vertex without intravenous contrast.  COMPARISON:  06/18/2013.  FINDINGS: Ventricles are normal configuration. There is ventricular and sulcal enlargement reflecting moderate atrophy, advanced for this patient's age.  No parenchymal masses or mass effect. No evidence of a recent cortical infarct.  There multiple small foci of hypoattenuation in the basal ganglia and white matter consistent with old lacune infarcts. Patchy white matter hypoattenuation seen more diffusely is consistent with moderate chronic microvascular ischemic change.  No extra-axial masses or abnormal fluid collections.  There is no intracranial hemorrhage.  Moderate left sphenoid sinus mucosal thickening. Remaining visualized sinuses and mastoid air cells are clear. No skull lesion.  IMPRESSION: 1. No acute intracranial abnormalities. 2. Moderate atrophy, advanced for age. Moderate chronic microvascular ischemic change. Small old lacunar  infarcts.   Electronically Signed   By: Lajean Manes M.D.   On: 01/04/2014 11:32   Mr Brain Wo Contrast  01/19/2014   CLINICAL DATA:  Acute encephalopathy. Altered mental status. Hypertension and end-stage renal disease with recent non STEMI with PCI.  EXAM: MRI HEAD WITHOUT CONTRAST  TECHNIQUE: Multiplanar, multiecho pulse sequences of the brain and surrounding structures were obtained without intravenous contrast.  COMPARISON:  CT head without contrast 01/08/2014. MRI of the brain 06/05/2013.  FINDINGS: Three punctate acute nonhemorrhagic infarcts are present. The first in the posterior left lentiform nucleus. The second is in the right parietal white matter on image 20 of series 3. The third is in the high a posterior left frontal lobe white matter on image 23 of series 3.  Multiple remote lacunar infarcts within the corona radiata and basal ganglia are again noted bilaterally. There is extensive white matter and lacunar infarcts within the pons. No acute hemorrhage or mass lesion is present. Advanced atrophy is similar to the prior studies.  Flow is present in the major intracranial arteries. The patient is status post bilateral lens replacements. Mild mucosal thickening is present in the anterior ethmoid air cells. Fluid is present in the left mastoid air cells. No obstructing nasopharyngeal lesion is evident.  IMPRESSION: 1. 3 new punctate areas of acute nonhemorrhagic infarct as described. 2. Advanced cerebral atrophy and white matter disease, likely resulting chronic microvascular ischemic changes and end-stage renal disease. 3. Multiple remote lacunar infarcts of the corona radiata and brainstem.   Electronically Signed   By: Lawrence Santiago M.D.   On: 01/19/2014 11:56   Dg Chest Portable 1 View  01/04/2014   CLINICAL DATA:  63 year old female with altered mental status. Shortness of breath. End-stage renal disease. History of congestive heart failure.  EXAM: PORTABLE CHEST - 1 VIEW  COMPARISON:   Chest x-ray 12/10/2013.  FINDINGS: Lung volumes are low. No consolidative airspace disease. No pleural effusions. Mild crowding of the pulmonary vasculature, accentuated by low lung volumes, without frank pulmonary edema. Atherosclerosis in the thoracic aorta. Heart size is normal. The patient  is rotated to the right on today's exam, resulting in distortion of the mediastinal contours and reduced diagnostic sensitivity and specificity for mediastinal pathology.  IMPRESSION: 1. Low lung volumes without radiographic evidence of acute cardiopulmonary disease. 2. Atherosclerosis.   Electronically Signed   By: Vinnie Langton M.D.   On: 01/01/2014 11:40    Oren Binet, MD  [facility] Pager:[phone]  If 7PM-7AM, please contact night-coverage [email or web address] Password [email or web address] 01/23/2014, 11:19 AM   LOS: 5 days

## 2014-01-23 NOTE — Progress Notes (Signed)
[facility] Progress Note   Subjective: Palliative care working wit hfamily Goals of care meeting this afternoon K and HCO3 ok, BP and Vol status ok   Filed Vitals:   01/23/14 0000 01/23/14 0355 01/23/14 0700 01/23/14 0749  BP: 142/47 148/61  154/51  Pulse: 91 85  95  Temp:  98.5 F (36.9 C) 99.8 F (37.7 C)   TempSrc:  Axillary Axillary   Resp: 19 16  17   Height:      Weight:      SpO2: 100% 100%  99%   Exam: Frail older adult female, not in distress,  No verbal response Chest clear bilat RRR no MRG Abd soft, NTND, no ascites No LE edema, R great toe dry gangrene Neuro is nf, gen weak, nonverbal today  HD: T[facility]  3hr 54min   46.5kg   F160   350/1.5    2K/2Ca [place]    Profile 2   Heparin none  Aranesp 200 q week, Venofer 50 q week,  Calcitriol 0.25 TIW po       Assessment: 1 AMS - acute on chronic, prob due to infection and progressive history of vascular dementia and acute on chronic CVAs on ASA/Plavix 2 Leukocytosis -  Improved, now treated for C diff colitis 3 ESRD on HD THS; intolerant of HD 10/27.  Palliative meetign scheduled today; no HD as anticipate withdrawal from HD 4 HTN/volume - below dry wt, holding home norvasc/MTP 5 Diarrhea - Cdif positive 10/27: on IV flagyl 6 DM w low BS 7 Anemia cont ESA , hold Fe for now 8 HPTH  - Ca high on admit, holding vit D, phoslo,  9 Gangrene R foot - not revasc candidate, amputate when needed 10 Debility -  11. DNR/DNI  Plan - No HD today. Family meeting.  Agree with palliative measures; HD is not providing a clear benefit    Recent Labs Lab 01/21/14 0353 01/22/14 0535 01/23/14 0233  NA 141 139 136*  K 4.4 4.0 3.5*  CL 97 96 96  CO2 23 23 25   GLUCOSE 70 116* 113*  BUN 47* 27* 33*  CREATININE 7.33* 4.92* 5.99*  CALCIUM 9.3 9.1 8.9    Recent Labs Lab 01/19/2014 1035 01/21/14 0353 01/22/14 0535  AST 35 23 29  ALT 14 14 17   ALKPHOS 214* 201* 204*  BILITOT 0.2* <0.2* 0.3  PROT 7.8 7.5  7.9  ALBUMIN 2.1* 2.0* 2.0*    Recent Labs Lab 01/21/2014 1035  01/21/14 0353 01/22/14 0535 01/23/14 0233  WBC 31.6*  < > 14.0* 17.1* 11.0*  NEUTROABS 28.4*  --   --  12.6*  --   HGB 10.7*  < > 10.3* 10.5* 9.3*  HCT 32.7*  < > 32.4* 33.8* 28.9*  MCV 82.6  < > 82.7 84.7 81.2  PLT 447*  < > 484* 474* 406*  < > = values in this interval not displayed. .  stroke: mapping our early stages of recovery book   Does not apply Once  . aspirin  300 mg Rectal Daily  . chlorhexidine  15 mL Mouth Rinse BID  . darbepoetin (ARANESP) injection - DIALYSIS  200 mcg Intravenous Q Tue-HD  . heparin  5,000 Units Subcutaneous 3 times per day  . metoprolol  5 mg Intravenous 4 times per day  . metronidazole  500 mg Intravenous Q8H   . dextrose 30 mL/hr at 01/22/14 1743   acetaminophen, albuterol, LORazepam, morphine injection, ondansetron (ZOFRAN) IV, ondansetron

## 2014-01-23 NOTE — Progress Notes (Signed)
Chaplain visited with pt, daughter and other family and friends at bedside.  Family was in process of contacting others and informing them of pt's poor prognosis.  Pt's daughter asked chaplain to pray.  Chaplain prayed with pt, daughter, family and friends and RN's at bedside.  Chaplain provided spiritual and emotional support as well as the ministry of presence, hospitality and empathetic listening.  Chaplain will follow up as needed.  01/23/14 1900  Clinical Encounter Type  Visited With Patient and family together;Health care provider  Visit Type Initial;Spiritual support;Social support;Critical Care;Patient actively dying  Referral From Family;Nurse  Spiritual Encounters  Spiritual Needs Prayer;Emotional;Grief support  Stress Factors  Patient Stress Factors Exhausted;Health changes  Family Stress Factors Exhausted;Family relationships;Health changes;Loss;Major life changes  Advance Directives (For Healthcare)  Does patient have an advance directive? No   Mertie Moores, Chaplain

## 2014-01-23 NOTE — Progress Notes (Addendum)
Progress Note from the Palliative Medicine Team at [place]: I met again today with Ms. Schoeneck's family: [name] and her boyfriend, Tobie Poet, and her other daughter. We discussed full comfort and the fact that we may be prolonging her pain and suffering. They are not comfortable with stopping dextrose or antibiotics at this point. They do acknowledge that they know she will die but they are still processing this information. We discussed transfer to medical floor, comfort as a priority, and continuing what we are doing for her now and letting "nature take it's course." Family also agrees to morphine infusion if needed to achieve comfort. They would also like to be notified of any changes.     Objective: Allergies  Allergen Reactions  . Phenergan [Promethazine Hcl]     unknown   Scheduled Meds: .  stroke: mapping our early stages of recovery book   Does not apply Once  . aspirin  300 mg Rectal Daily  . chlorhexidine  15 mL Mouth Rinse BID  . darbepoetin (ARANESP) injection - DIALYSIS  200 mcg Intravenous Q Tue-HD  . heparin  5,000 Units Subcutaneous 3 times per day  . metoprolol  5 mg Intravenous 4 times per day  . metronidazole  500 mg Intravenous Q8H   Continuous Infusions: . dextrose 30 mL/hr at 01/23/14 1300   PRN Meds:.acetaminophen, albuterol, LORazepam, morphine injection, ondansetron (ZOFRAN) IV, ondansetron  BP 159/76  Pulse 112  Temp(Src) 98.5 F (36.9 C) (Axillary)  Resp 17  Ht 5' 1"  (1.549 m)  Wt 44.1 kg (97 lb 3.6 oz)  BMI 18.38 kg/m2  SpO2 99%   PPS: 10%   Intake/Output Summary (Last 24 hours) at 01/23/14 1456 Last data filed at 01/22/14 1900  Gross per 24 hour  Intake 315.17 ml  Output      1 ml  Net 314.17 ml       Physical Exam:  General: NAD, thin, frail  HEENT: Temporal muscle wasting, no JVD  Chest: CTA throughout, no labored breathing, symmetric  CVS: RRR, S1 S2  Abdomen: Soft, NT, ND, +BS  Ext: MAE, no edema, right great toe  black eschar  Neuro: Obtunded, light moan to stimulation, unable to follow commands    Labs: CBC    Component Value Date/Time   WBC 11.0* 01/23/2014 0233   RBC 3.56* 01/23/2014 0233   RBC 2.72* 12/11/2013 0830   HGB 9.3* 01/23/2014 0233   HCT 28.9* 01/23/2014 0233   PLT 406* 01/23/2014 0233   MCV 81.2 01/23/2014 0233   MCH 26.1 01/23/2014 0233   MCHC 32.2 01/23/2014 0233   RDW 16.3* 01/23/2014 0233   LYMPHSABS 2.4 01/22/2014 0535   MONOABS 1.9* 01/22/2014 0535   EOSABS 0.2 01/22/2014 0535   BASOSABS 0.0 01/22/2014 0535    BMET    Component Value Date/Time   NA 136* 01/23/2014 0233   K 3.5* 01/23/2014 0233   CL 96 01/23/2014 0233   CO2 25 01/23/2014 0233   GLUCOSE 113* 01/23/2014 0233   BUN 33* 01/23/2014 0233   CREATININE 5.99* 01/23/2014 0233   CALCIUM 8.9 01/23/2014 0233   GFRNONAA 7* 01/23/2014 0233   GFRAA 8* 01/23/2014 0233    CMP     Component Value Date/Time   NA 136* 01/23/2014 0233   K 3.5* 01/23/2014 0233   CL 96 01/23/2014 0233   CO2 25 01/23/2014 0233   GLUCOSE 113* 01/23/2014 0233   BUN 33* 01/23/2014 0233   CREATININE 5.99* 01/23/2014 4332  CALCIUM 8.9 01/23/2014 0233   PROT 7.9 01/22/2014 0535   ALBUMIN 2.0* 01/22/2014 0535   AST 29 01/22/2014 0535   ALT 17 01/22/2014 0535   ALKPHOS 204* 01/22/2014 0535   BILITOT 0.3 01/22/2014 0535   GFRNONAA 7* 01/23/2014 0233   GFRAA 8* 01/23/2014 0233    Assessment and Plan: 1. Code Status: DNR 2. Symptom Control: 1. Pain: Morphine 1-2 mg IV every hour prn. Family agrees to morphine gtt if needed to achieve comfort.  2. Anxiety: Lorazepam 1 mg every 4 hours prn.  3. Psycho/Social: Emotional support provided to family during difficult situation.  4. Disposition: To be determined on outcomes - likely hospital death.    Time In Time Out Total Time Spent with Patient Total Overall Time  1415 1450 79mn 341m    Greater than 50%  of this time was spent counseling and coordinating care related to  the above assessment and plan.  AlVinie SillNP Palliative Medicine Team Pager # [phone]-F 8a-5p) Team Phone # [phone]/Weekends)

## 2014-01-24 LAB — CULTURE, BLOOD (ROUTINE X 2)
CULTURE: NO GROWTH
CULTURE: NO GROWTH

## 2014-01-26 NOTE — Progress Notes (Signed)
Pt expired at 0338.  Pt is comfort care and hospital death was anticipated. She was pronounced dead by 2 RNs. Family here and offered condolences. Death certificate given to RN  Lacy Duverney [facility] [phone]

## 2014-01-26 NOTE — Progress Notes (Signed)
Pt found breathless and pulseless at 0338, confirmed with Stacie Acres, RN. No family at bedside at time of passing, called daughter/[place] to notify of pt passing. Forrest Moron, NP (o/c for Dr Sloan Leiter) notified of pt passing. Requested [facility] [place], [place]). [facility] called, pt is not a candidate for donation ok to remove body. Referral number: [id number]. Family currently at bedside awaiting [facility]

## 2014-01-26 DEATH — deceased

## 2014-02-04 NOTE — Discharge Summary (Signed)
Death Summary  [name]:[id number] DOB: March 16, 1951 DOA: 2014-02-05  PCP: Marco Collie, MD PCP/Office notified: will route discharge summary  Admit date: 02-05-14 Date of Death: 02/11/2014  Final Diagnoses:  Active Problems:   CKD (chronic kidney disease) stage V requiring chronic dialysis   Vascular dementia   Hypertensive emergency   NSTEMI (non-ST elevated myocardial infarction)   Malnutrition of moderate degree   Encephalopathy acute   Cerebral thrombosis with cerebral infarction  History of present illness:  63 y.o. female with ESRD on dialysis (TTS), recent non-STEMI with PCI, hypertension, and dementia who presented with AMS. Daughter said pt was doing well one week prior to this admission but had become more lethargic and was unable to get out of the bed. She also reported fevers of 101 F.  Hospital Course:  Brief summary  63 y.o. female with ESRD on dialysis (TTS), recent non-STEMI with PCI, hypertension, and dementia who presented with AMS. Daughter said pt was doing well one week prior to this admission but had become more lethargic and was unable to get out of the bed. She also reported fevers of 101 F. Started on broad spectrum antimicrobial agents including vancomycin/cefepime and acyclovir. Subsequent workup revealed C. difficile PCR positive. Patient was maintained on IV Flagyl.unfortunately, patient continued to deteriorate, and on 10/29 patient started having massive stools with blood. After discussion with family, patient was transitioned to comfort measures. Patient subsequently expired on 02/11/14  See below for further details regarding hospital course:  Acute encephalopathy on chronic dementia  - multifactorial: acute infection (C. difficile) superimposed on vascular dementia, 3 new punctate infarctions noted on brain MRI with evidence of chronic multiple small lacunar strokes, and dehydration form poor oral intake.Unfortunately no significant improvement  throughout the hospital course.  Severe sepsis -Secondary to C. difficile colitis. Unfortunately, continued to deteriorate in spite of care with IV antibiotics. After discussion with family, started on comfort measures, patient subsequently expired on 2014/02/11.  C. difficile colitis -Started on IV Flagyl, as patient was not safe for oral intake. Continued to deteriorate,subsequently expired on February 11, 2014.  Acute nonhemorrhagic strokes  -Not a candidate for further workup, further workup will not change management  ESRD requiring chronic dialysis  -patient was followed by nephrology during this hospital course. Nephrology deemed that given patient's overall general condition, patient was not a candidate for outpatient hemodialysis. Palliative care was consulted as well.  Unfortunately, patient continued to deteriorate and expired on 02-11-14.  SignedOren Binet  [facility] 02/04/2014, 3:05 PM

## 2014-03-06 ENCOUNTER — Encounter (HOSPITAL_COMMUNITY): Payer: Self-pay | Admitting: Vascular Surgery

## 2015-09-09 IMAGING — CT CT HEAD W/O CM
1 of 4 series · 15 of 30 positions shown, 19 images · non-contrast
Comparison: CT 05/18/2013

CLINICAL DATA: Confusion.  Dialysis patient

EXAM:
CT HEAD WITHOUT CONTRAST
TECHNIQUE: Contiguous axial images were obtained from the base of the skull
through the vertex without intravenous contrast.

[Series 3: head 2.0 h70h · axial · 0.43mm/px · z∈[+521,+657]mm · 15 of 78 slices shown, 19 images]
[im 5/78  brain]
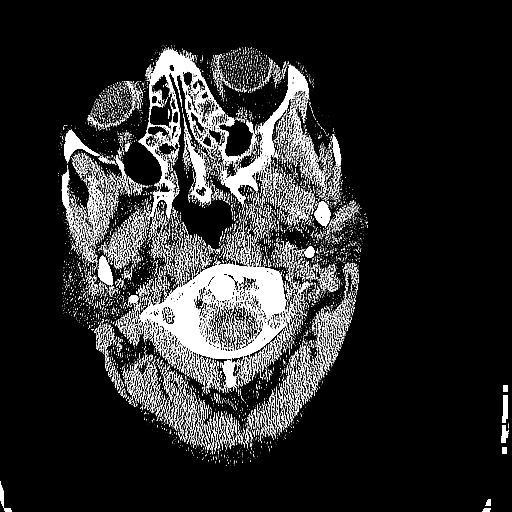
[im 5/78  bone]
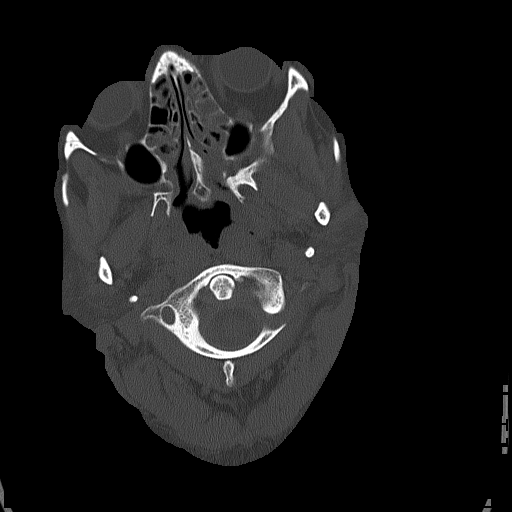
[im 10/78  brain]
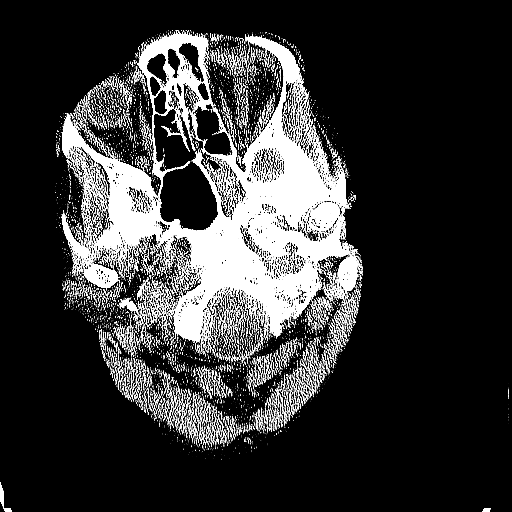
[im 14/78  brain]
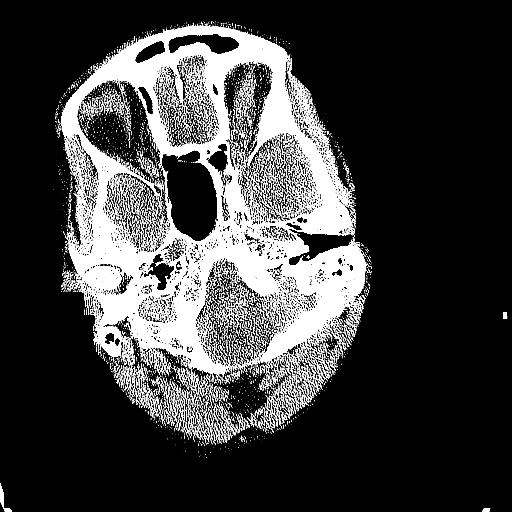
[im 19/78  brain]
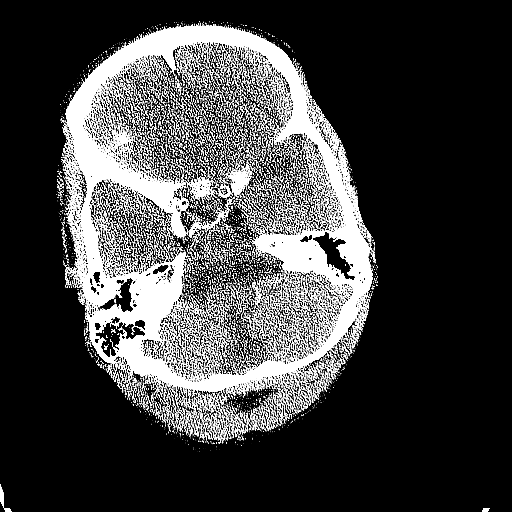
[im 23/78  brain]
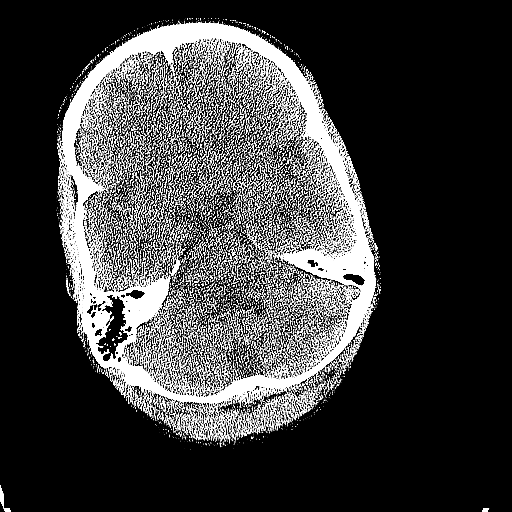
[im 23/78  bone]
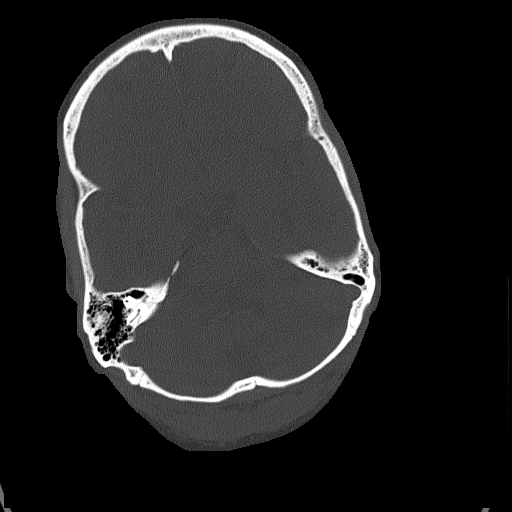
[im 28/78  brain]
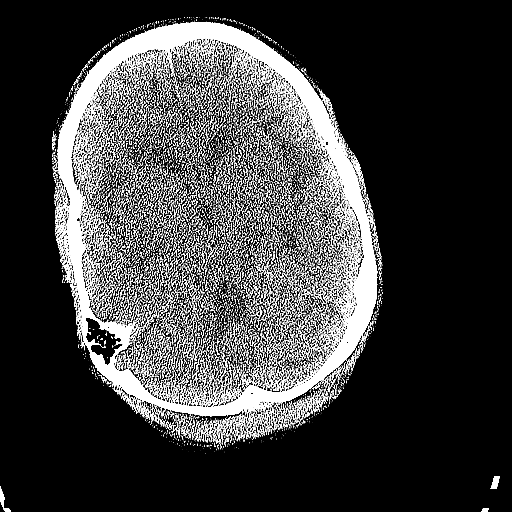
[im 32/78  brain]
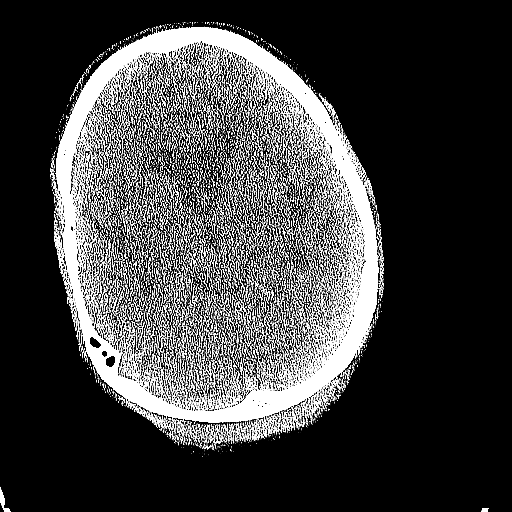
[im 41/78  brain]
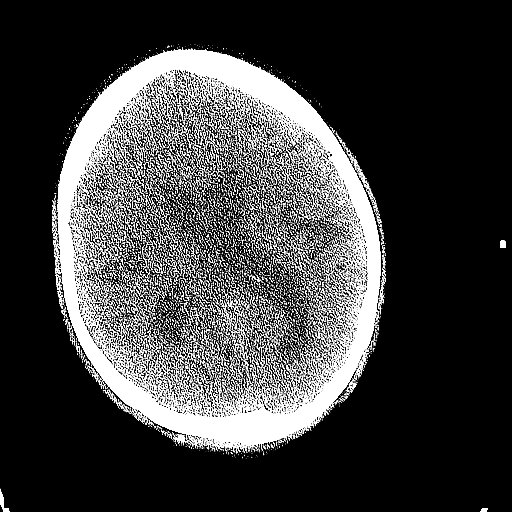
[im 46/78  brain]
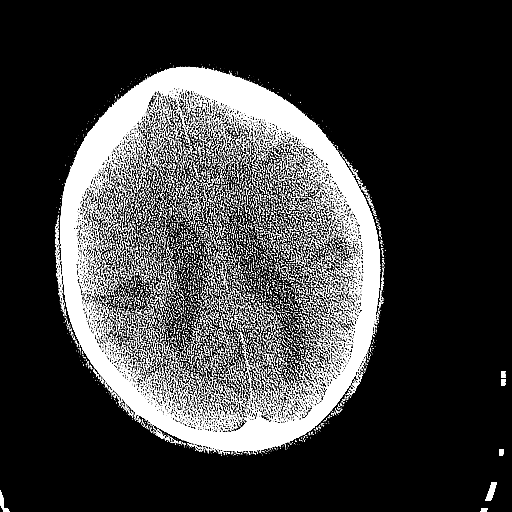
[im 46/78  bone]
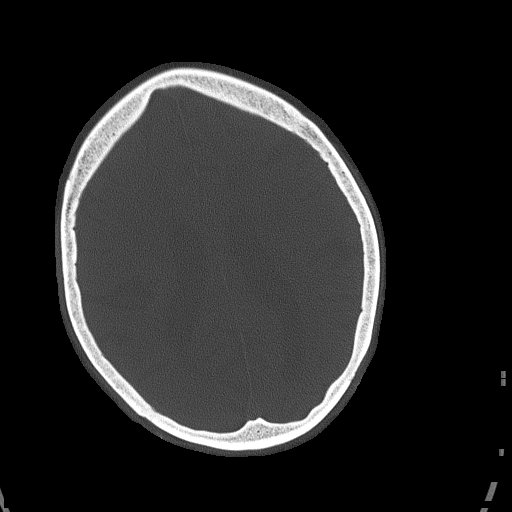
[im 50/78  brain]
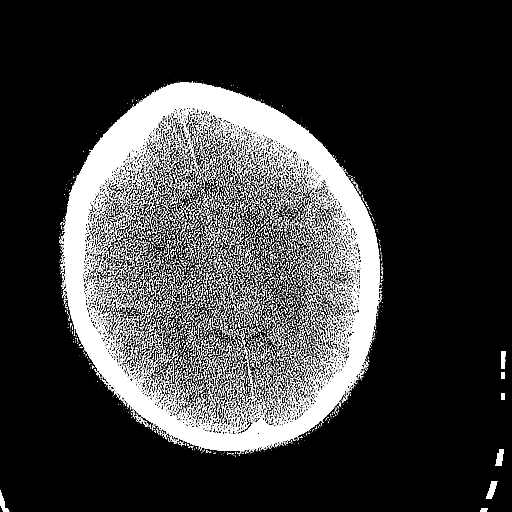
[im 55/78  brain]
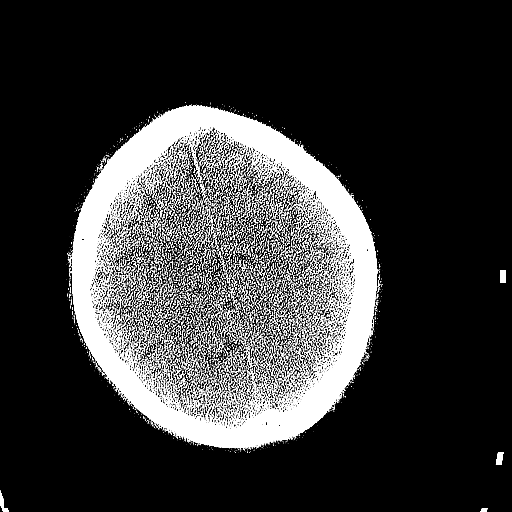
[im 59/78  brain]
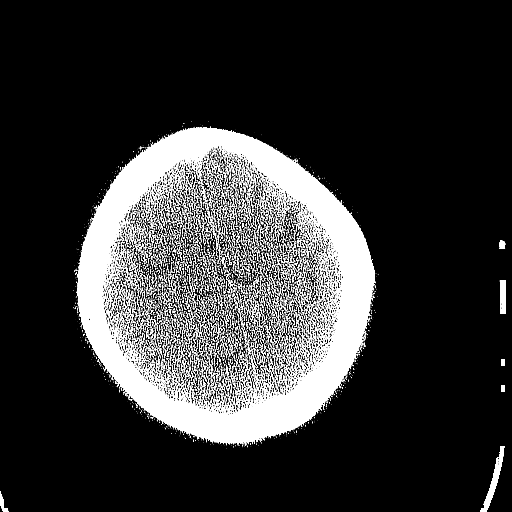
[im 64/78  brain]
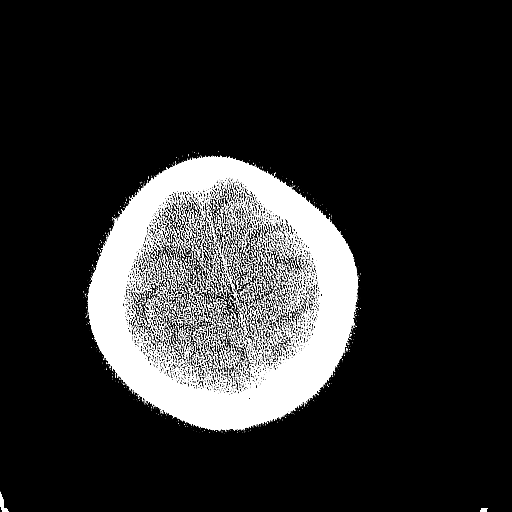
[im 64/78  bone]
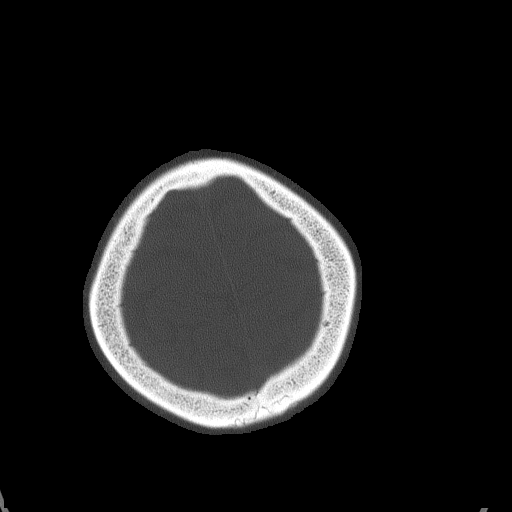
[im 68/78  brain]
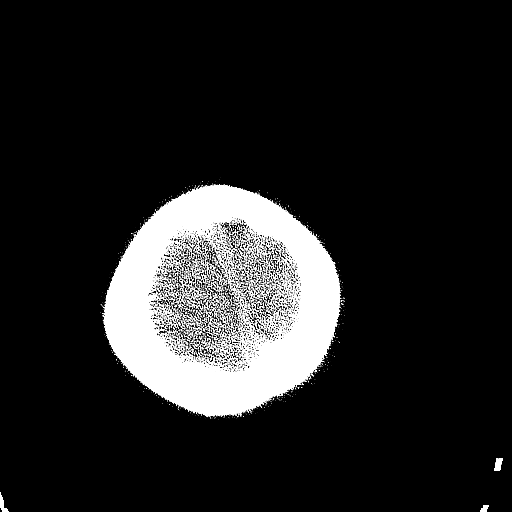
[im 73/78  brain]
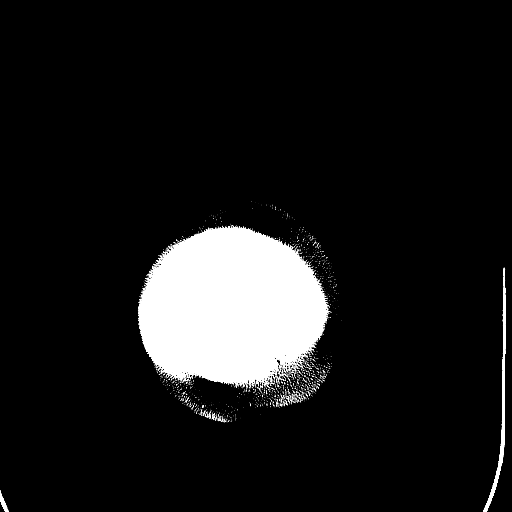

[15 of 30 positions shown; findings below may reference images not displayed]

FINDINGS: Generalized atrophy. Moderate to advanced chronic microvascular
ischemic changes in the white matter. Chronic ischemia in the
thalamus bilaterally. These findings are stable.

Negative for acute infarct.  Negative for acute hemorrhage or mass.

Mucosal edema in the paranasal sinuses. Air-fluid level left
maxillary sinus. No acute skull abnormality. Atherosclerotic
disease.
IMPRESSION: Atrophy and chronic microvascular ischemia. No acute intracranial
abnormality.

Sinusitis with air-fluid level.
